# Patient Record
Sex: Male | Born: 1955 | Race: White | Hispanic: No | Marital: Married | Smoking: Former smoker
Health system: Southern US, Community
[De-identification: ages and names within clinical notes are randomized; demographics above are authoritative.]

## PROBLEM LIST (undated history)

## (undated) DIAGNOSIS — R1310 Dysphagia, unspecified: Secondary | ICD-10-CM

## (undated) DIAGNOSIS — I219 Acute myocardial infarction, unspecified: Secondary | ICD-10-CM

## (undated) DIAGNOSIS — G931 Anoxic brain damage, not elsewhere classified: Secondary | ICD-10-CM

## (undated) DIAGNOSIS — I10 Essential (primary) hypertension: Secondary | ICD-10-CM

## (undated) DIAGNOSIS — Z720 Tobacco use: Secondary | ICD-10-CM

## (undated) DIAGNOSIS — I255 Ischemic cardiomyopathy: Secondary | ICD-10-CM

## (undated) DIAGNOSIS — R0602 Shortness of breath: Secondary | ICD-10-CM

## (undated) DIAGNOSIS — E785 Hyperlipidemia, unspecified: Secondary | ICD-10-CM

## (undated) DIAGNOSIS — R569 Unspecified convulsions: Secondary | ICD-10-CM

## (undated) DIAGNOSIS — G40909 Epilepsy, unspecified, not intractable, without status epilepticus: Secondary | ICD-10-CM

## (undated) DIAGNOSIS — I2510 Atherosclerotic heart disease of native coronary artery without angina pectoris: Secondary | ICD-10-CM

## (undated) HISTORY — PX: BACK SURGERY: SHX140

## (undated) HISTORY — PX: CARDIAC CATHETERIZATION: SHX172

## (undated) HISTORY — PX: CORONARY ANGIOPLASTY WITH STENT PLACEMENT: SHX49

---

## 2012-10-02 DIAGNOSIS — I219 Acute myocardial infarction, unspecified: Secondary | ICD-10-CM

## 2012-10-02 HISTORY — DX: Acute myocardial infarction, unspecified: I21.9

## 2012-10-02 HISTORY — PX: TRACHEOSTOMY: SUR1362

## 2012-10-30 ENCOUNTER — Inpatient Hospital Stay (HOSPITAL_COMMUNITY)
Admission: EM | Admit: 2012-10-30 | Discharge: 2012-11-25 | DRG: 878 | Disposition: A | Discharge status: Rehabilitation Facility | Payer: BC Managed Care – PPO | Source: Other Acute Inpatient Hospital | Attending: Pulmonary Disease | Admitting: Pulmonary Disease

## 2012-10-30 ENCOUNTER — Inpatient Hospital Stay (HOSPITAL_COMMUNITY): Payer: BC Managed Care – PPO

## 2012-10-30 ENCOUNTER — Inpatient Hospital Stay: Payer: Self-pay | Admitting: Cardiology

## 2012-10-30 DIAGNOSIS — R7309 Other abnormal glucose: Secondary | ICD-10-CM | POA: Diagnosis present

## 2012-10-30 DIAGNOSIS — I469 Cardiac arrest, cause unspecified: Secondary | ICD-10-CM

## 2012-10-30 DIAGNOSIS — G40901 Epilepsy, unspecified, not intractable, with status epilepticus: Secondary | ICD-10-CM

## 2012-10-30 DIAGNOSIS — E872 Acidosis, unspecified: Secondary | ICD-10-CM | POA: Diagnosis present

## 2012-10-30 DIAGNOSIS — I5021 Acute systolic (congestive) heart failure: Secondary | ICD-10-CM

## 2012-10-30 DIAGNOSIS — D72829 Elevated white blood cell count, unspecified: Secondary | ICD-10-CM | POA: Diagnosis present

## 2012-10-30 DIAGNOSIS — I10 Essential (primary) hypertension: Secondary | ICD-10-CM | POA: Diagnosis present

## 2012-10-30 DIAGNOSIS — Z9861 Coronary angioplasty status: Secondary | ICD-10-CM

## 2012-10-30 DIAGNOSIS — J9601 Acute respiratory failure with hypoxia: Secondary | ICD-10-CM

## 2012-10-30 DIAGNOSIS — I2589 Other forms of chronic ischemic heart disease: Secondary | ICD-10-CM | POA: Diagnosis present

## 2012-10-30 DIAGNOSIS — I4729 Other ventricular tachycardia: Secondary | ICD-10-CM | POA: Diagnosis present

## 2012-10-30 DIAGNOSIS — R4020 Unspecified coma: Secondary | ICD-10-CM | POA: Diagnosis present

## 2012-10-30 DIAGNOSIS — R5381 Other malaise: Secondary | ICD-10-CM | POA: Diagnosis present

## 2012-10-30 DIAGNOSIS — I472 Ventricular tachycardia, unspecified: Secondary | ICD-10-CM

## 2012-10-30 DIAGNOSIS — G40401 Other generalized epilepsy and epileptic syndromes, not intractable, with status epilepticus: Secondary | ICD-10-CM | POA: Diagnosis present

## 2012-10-30 DIAGNOSIS — G931 Anoxic brain damage, not elsewhere classified: Secondary | ICD-10-CM | POA: Diagnosis present

## 2012-10-30 DIAGNOSIS — R570 Cardiogenic shock: Secondary | ICD-10-CM

## 2012-10-30 DIAGNOSIS — Z930 Tracheostomy status: Secondary | ICD-10-CM

## 2012-10-30 DIAGNOSIS — J4489 Other specified chronic obstructive pulmonary disease: Secondary | ICD-10-CM | POA: Diagnosis present

## 2012-10-30 DIAGNOSIS — I5022 Chronic systolic (congestive) heart failure: Secondary | ICD-10-CM

## 2012-10-30 DIAGNOSIS — J9600 Acute respiratory failure, unspecified whether with hypoxia or hypercapnia: Secondary | ICD-10-CM | POA: Diagnosis present

## 2012-10-30 DIAGNOSIS — IMO0002 Reserved for concepts with insufficient information to code with codable children: Secondary | ICD-10-CM | POA: Diagnosis present

## 2012-10-30 DIAGNOSIS — J9819 Other pulmonary collapse: Secondary | ICD-10-CM | POA: Diagnosis present

## 2012-10-30 DIAGNOSIS — I749 Embolism and thrombosis of unspecified artery: Secondary | ICD-10-CM | POA: Diagnosis present

## 2012-10-30 DIAGNOSIS — R4701 Aphasia: Secondary | ICD-10-CM | POA: Diagnosis present

## 2012-10-30 DIAGNOSIS — R627 Adult failure to thrive: Secondary | ICD-10-CM | POA: Diagnosis present

## 2012-10-30 DIAGNOSIS — I2510 Atherosclerotic heart disease of native coronary artery without angina pectoris: Secondary | ICD-10-CM | POA: Diagnosis present

## 2012-10-30 DIAGNOSIS — F17200 Nicotine dependence, unspecified, uncomplicated: Secondary | ICD-10-CM | POA: Diagnosis present

## 2012-10-30 DIAGNOSIS — I213 ST elevation (STEMI) myocardial infarction of unspecified site: Secondary | ICD-10-CM

## 2012-10-30 DIAGNOSIS — I498 Other specified cardiac arrhythmias: Secondary | ICD-10-CM | POA: Diagnosis present

## 2012-10-30 DIAGNOSIS — J449 Chronic obstructive pulmonary disease, unspecified: Secondary | ICD-10-CM | POA: Diagnosis present

## 2012-10-30 DIAGNOSIS — G7281 Critical illness myopathy: Secondary | ICD-10-CM | POA: Diagnosis present

## 2012-10-30 DIAGNOSIS — Z66 Do not resuscitate: Secondary | ICD-10-CM | POA: Diagnosis present

## 2012-10-30 DIAGNOSIS — I219 Acute myocardial infarction, unspecified: Secondary | ICD-10-CM

## 2012-10-30 DIAGNOSIS — E876 Hypokalemia: Secondary | ICD-10-CM | POA: Diagnosis present

## 2012-10-30 DIAGNOSIS — Z9911 Dependence on respirator [ventilator] status: Secondary | ICD-10-CM

## 2012-10-30 DIAGNOSIS — E871 Hypo-osmolality and hyponatremia: Secondary | ICD-10-CM | POA: Diagnosis present

## 2012-10-30 DIAGNOSIS — G9340 Encephalopathy, unspecified: Secondary | ICD-10-CM

## 2012-10-30 DIAGNOSIS — N390 Urinary tract infection, site not specified: Secondary | ICD-10-CM

## 2012-10-30 DIAGNOSIS — I2109 ST elevation (STEMI) myocardial infarction involving other coronary artery of anterior wall: Principal | ICD-10-CM | POA: Diagnosis present

## 2012-10-30 DIAGNOSIS — R82998 Other abnormal findings in urine: Secondary | ICD-10-CM | POA: Diagnosis present

## 2012-10-30 DIAGNOSIS — A498 Other bacterial infections of unspecified site: Secondary | ICD-10-CM | POA: Diagnosis present

## 2012-10-30 LAB — CBC
HCT: 47.4 % (ref 40.0–52.0)
HGB: 15.5 g/dL (ref 13.0–18.0)
MCH: 32 pg (ref 26.0–34.0)
MCHC: 32.7 g/dL (ref 32.0–36.0)
MCV: 98 fL (ref 80–100)
Platelet: 256 10*3/uL (ref 150–440)
RBC: 4.85 10*6/uL (ref 4.40–5.90)
RDW: 13.9 % (ref 11.5–14.5)
WBC: 15.2 10*3/uL — ABNORMAL HIGH (ref 3.8–10.6)

## 2012-10-30 LAB — COMPREHENSIVE METABOLIC PANEL
Albumin: 3.3 g/dL — ABNORMAL LOW (ref 3.4–5.0)
Co2: 10 mmol/L — CL (ref 21–32)
EGFR (Non-African Amer.): 55 — ABNORMAL LOW
Glucose: 267 mg/dL — ABNORMAL HIGH (ref 65–99)
Osmolality: 282 (ref 275–301)
Potassium: 3.9 mmol/L (ref 3.5–5.1)
SGPT (ALT): 280 U/L — ABNORMAL HIGH (ref 12–78)

## 2012-10-30 LAB — CK TOTAL AND CKMB (NOT AT ARMC): CK-MB: 3.5 ng/mL (ref 0.5–3.6)

## 2012-10-30 LAB — APTT: Activated PTT: 40.8 secs — ABNORMAL HIGH (ref 23.6–35.9)

## 2012-10-30 LAB — TROPONIN I: Troponin-I: 0.31 ng/mL — ABNORMAL HIGH

## 2012-10-30 MED ORDER — EPTIFIBATIDE 75 MG/100ML IV SOLN
INTRAVENOUS | Status: AC
  Administered 2012-10-31: 75000 ug via INTRAVENOUS
  Filled 2012-10-30: qty 100

## 2012-10-30 MED ORDER — BIOTENE DRY MOUTH MT LIQD
15.0000 mL | Freq: Four times a day (QID) | OROMUCOSAL | Status: DC
  Administered 2012-10-31 – 2012-11-21 (×88): 15 mL via OROMUCOSAL

## 2012-10-30 MED ORDER — SODIUM CHLORIDE 0.9 % IV SOLN
1.0000 ug/kg/min | INTRAVENOUS | Status: DC
  Administered 2012-10-31: 1 ug/kg/min via INTRAVENOUS
  Administered 2012-11-01: 1.2 ug/kg/min via INTRAVENOUS
  Filled 2012-10-30 (×2): qty 20

## 2012-10-30 MED ORDER — CISATRACURIUM BOLUS VIA INFUSION
0.1000 mg/kg | Freq: Once | INTRAVENOUS | Status: AC
  Administered 2012-10-31: 7.6 mg via INTRAVENOUS
  Filled 2012-10-30: qty 8

## 2012-10-30 MED ORDER — MIDAZOLAM BOLUS VIA INFUSION
2.0000 mg | INTRAVENOUS | Status: DC | PRN
  Filled 2012-10-30: qty 2

## 2012-10-30 MED ORDER — MIDAZOLAM HCL 5 MG/ML IJ SOLN
1.0000 mg/h | INTRAMUSCULAR | Status: DC
  Administered 2012-10-31 (×3): 2 mg/h via INTRAVENOUS
  Administered 2012-11-01: 1 mg/h via INTRAVENOUS
  Administered 2012-11-01: 4 mg/h via INTRAVENOUS
  Administered 2012-11-03: 2 mg/h via INTRAVENOUS
  Filled 2012-10-30 (×5): qty 10

## 2012-10-30 MED ORDER — MIDAZOLAM HCL 5 MG/ML IJ SOLN: 2.0000 mg | Freq: Once | INTRAMUSCULAR | Status: DC | PRN

## 2012-10-30 MED ORDER — CHLORHEXIDINE GLUCONATE 0.12 % MT SOLN
15.0000 mL | Freq: Two times a day (BID) | OROMUCOSAL | Status: DC
  Administered 2012-10-31 – 2012-11-21 (×43): 15 mL via OROMUCOSAL
  Filled 2012-10-30 (×47): qty 15

## 2012-10-30 MED ORDER — ARTIFICIAL TEARS OP OINT
1.0000 "application " | TOPICAL_OINTMENT | Freq: Three times a day (TID) | OPHTHALMIC | Status: DC
  Administered 2012-10-31 – 2012-11-01 (×5): 1 via OPHTHALMIC
  Filled 2012-10-30: qty 3.5

## 2012-10-30 MED ORDER — SODIUM CHLORIDE 0.9 % IV SOLN: 2000.0000 mL | Freq: Once | INTRAVENOUS | Status: AC

## 2012-10-30 MED ORDER — LABETALOL HCL 5 MG/ML IV SOLN
INTRAVENOUS | Status: AC
  Filled 2012-10-30: qty 4

## 2012-10-30 MED ORDER — ASPIRIN 300 MG RE SUPP
300.0000 mg | RECTAL | Status: AC
  Filled 2012-10-30: qty 1

## 2012-10-30 MED ORDER — SODIUM CHLORIDE 0.9 % IV SOLN
25.0000 ug/h | INTRAVENOUS | Status: DC
  Administered 2012-10-31 (×2): 100 ug/h via INTRAVENOUS
  Administered 2012-10-31: 150 ug/h via INTRAVENOUS
  Administered 2012-11-01: 100 ug/h via INTRAVENOUS
  Administered 2012-11-01: 250 ug/h via INTRAVENOUS
  Filled 2012-10-30 (×4): qty 50

## 2012-10-30 MED ORDER — NOREPINEPHRINE BITARTRATE 1 MG/ML IJ SOLN
2.0000 ug/min | INTRAVENOUS | Status: DC
  Administered 2012-10-31: 10 ug/min via INTRAVENOUS
  Administered 2012-10-31: 5 ug/min via INTRAVENOUS
  Administered 2012-11-01: 16 ug/min via INTRAVENOUS
  Administered 2012-11-01: 25 ug/min via INTRAVENOUS
  Filled 2012-10-30 (×6): qty 8

## 2012-10-30 MED ORDER — CISATRACURIUM BOLUS VIA INFUSION
0.0500 mg/kg | Freq: Once | INTRAVENOUS | Status: AC | PRN
  Filled 2012-10-30: qty 4

## 2012-10-30 MED ORDER — FENTANYL BOLUS VIA INFUSION
50.0000 ug | INTRAVENOUS | Status: DC | PRN
  Filled 2012-10-30: qty 50

## 2012-10-30 MED ORDER — FENTANYL CITRATE 0.05 MG/ML IJ SOLN
INTRAMUSCULAR | Status: AC
  Administered 2012-10-30: 100 ug
  Filled 2012-10-30: qty 2

## 2012-10-30 NOTE — Consult Note (Signed)
Reason for Consult: Cardiac Arrest s/p revascularization, IABP, Cooling protocol Referring Physician: Dr. Guadlupe Spanish Walter Frazier is an 57 y.o. male.  HPI: Mr. Walter Frazier is a 57 yo man with prior history of 1 ppd tobacco who had out of hospital cardiac arrest and was taken to [facility]. Walter Frazier was taken the cardiac catheterization lab given ST elevation on ECG in setting of cardiac arrest where by report he had proximal LAD occlusion with atherectomy and DES to proximal LAD. Per discussion with Walter Frazier wife by Dr. Kendrick Fries, he was working on a car when he went down and a nurse friend was at their home along with a firefighter neighborhood who initiated CPR. EMS called/arrived and CPR in progress with 3 defibrillations reported in the field, probably several more in the ambulance and ER followed by 6-8 defibrillations in the cath lab during revascularization leading to IABP placement. I personally called the ICU along with my nursing staff here in our CICU and we confirmed that the following were given:   600 mg plavix at 18:11 through NGT, 324 mg aspirin at 18:11 through NGT, angiomax 27 ml/hr as 250 mg/50 ml NS with 61 mg bivalirudin recorded at 17:02, 1 gram Mg 17:24, amiodarone 150 mg at 17:30 with subsequent drip started, integrellin started at 17:46 at 12.3 ml/hr. On arrival, Walter Frazier cool extremities on cooling protocol, IABP with Chest x-ray confirmation of good position and good augmentation.    Family history of CAD  Social History: + tobacco, 1 ppd, no significant etoh or drugs  Allergies: No Known Allergies  Medications:  I have reviewed the patient's current medications. Prior to Admission:  No prescriptions prior to admission   Scheduled: . antiseptic oral rinse  15 mL Mouth Rinse QID  . artificial tears  1 application Both Eyes Q8H  . chlorhexidine  15 mL Mouth Rinse BID  . labetalol      . pantoprazole (PROTONIX) IV  40 mg  Intravenous QHS   Continuous: . cisatracurium (NIMBEX) infusion 1 mcg/kg/min (10/31/12 0021)  . fentaNYL infusion INTRAVENOUS 100 mcg/hr (10/31/12 0023)  . heparin    . midazolam (VERSED) infusion 2 mg/hr (10/31/12 0009)  . norepinephrine (LEVOPHED) Adult infusion      Results for orders placed during the hospital encounter of 10/30/12 (from the past 48 hour(s))  GLUCOSE, CAPILLARY     Status: Abnormal   Collection Time    10/30/12 11:54 PM      Result Value Range   Glucose-Capillary 256 (*) 70 - 99 mg/dL   Comment 1 Notify RN    POCT I-STAT, CHEM 8     Status: Abnormal   Collection Time    10/31/12 12:41 AM      Result Value Range   Sodium 138  135 - 145 mEq/L   Potassium 3.8  3.5 - 5.1 mEq/L   Chloride 106  96 - 112 mEq/L   BUN 17  6 - 23 mg/dL   Creatinine, Ser 1.61  0.50 - 1.35 mg/dL   Glucose, Bld 096 (*) 70 - 99 mg/dL   Calcium, Ion 0.45  4.09 - 1.23 mmol/L   TCO2 20  0 - 100 mmol/L   Hemoglobin 18.4 (*) 13.0 - 17.0 g/dL   HCT 81.1 (*) 91.4 - 78.2 %  POCT I-STAT 3, BLOOD GAS (G3+)     Status: Abnormal   Collection Time    10/31/12 12:47 AM      Result Value  Range   pH, Arterial 7.174 (*) 7.350 - 7.450   pCO2 arterial 44.0  35.0 - 45.0 mmHg   pO2, Arterial 179.0 (*) 80.0 - 100.0 mmHg   Bicarbonate 16.2 (*) 20.0 - 24.0 mEq/L   TCO2 17  0 - 100 mmol/L   O2 Saturation 99.0     Acid-base deficit 12.0 (*) 0.0 - 2.0 mmol/L   Collection site RADIAL, ALLEN'S TEST ACCEPTABLE     Drawn by RT     Sample type ARTERIAL     Comment NOTIFIED PHYSICIAN    GLUCOSE, CAPILLARY     Status: Abnormal   Collection Time    10/31/12 12:57 AM      Result Value Range   Glucose-Capillary 243 (*) 70 - 99 mg/dL    Dg Chest Portable 1 View  10/31/2012   *RADIOLOGY REPORT*  Clinical Data: Central line placement.  PORTABLE CHEST - 1 VIEW  Comparison: Chest radiograph performed 10/30/2012  Findings: The patient's endotracheal tube is seen ending 6-7 cm above the carina.  A left IJ line is  noted ending about the distal SVC.  An enteric tube is noted extending below the diaphragm.  The patient's intra-aortic balloon pump tip has migrated approximately 2 cm superiorly since the prior study.  The lungs are well expanded.  Minimal left basilar density is thought to reflect normal vasculature and the overlying external pacing pad.  No pleural effusion or pneumothorax is seen.  The cardiomediastinal silhouette remains normal in size.  No acute osseous abnormalities are seen.  IMPRESSION:  1.  Left IJ line noted ending about the distal SVC. 2.  Endotracheal tube seen ending 6-7 cm above the carina.  Would advance this by 3-4 cm. 3.  Intra-aortic balloon pump tip has migrated 2 cm superiorly since the prior study. Would retract this by 2 cm. 4.  No acute cardiopulmonary process seen.  These results were called by telephone on 10/31/2012 at 12:31 a.m. to Nursing on [id number], who verbally acknowledged these results.   Original Report Authenticated By: Walter Frazier, M.D.   Dg Chest Port 1 View  10/30/2012   *RADIOLOGY REPORT*  Clinical Data: Assess intra-aortic balloon pump.  PORTABLE CHEST - 1 VIEW  Comparison: None.  Findings: The patient's endotracheal tube is seen ending 5-6 cm above the carina.  An enteric tube is noted extending below the diaphragm.  The tip of the intra-aortic balloon pump is seen just below the aortic arch, just above the level of the carina, in appropriate position.  The lungs are relatively well expanded.  Mild vascular congestion is noted, without definite pulmonary edema.  The external pacing pad is noted overlying the left hemithorax.  No pleural effusion or pneumothorax is seen.  The cardiomediastinal silhouette is normal in size.  No acute osseous abnormalities are identified.  IMPRESSION:  1.  Tip of the intra-aortic balloon pump noted just above the level of the carina and just below the aortic arch, in appropriate position. 2.  Endotracheal tube seen ending 5-6 cm above the  carina. 3.  Mild vascular congestion, without definite pulmonary edema.   Original Report Authenticated By: Walter Frazier, M.D.    Review of Systems  Unable to perform ROS: intubated   Blood pressure 100/56, pulse 63, temperature 92.5 F (33.6 C), temperature source Core (Comment), resp. rate 20, height 5\' 9"  (1.753 m), weight 76.3 kg (168 lb 3.4 oz), SpO2 98.00%. Physical Exam  Nursing note and vitals reviewed. Constitutional: He is oriented to person,  place, and time.  Intubated/sedated  HENT:  Intubated, sedated, ETT in place, left IJ in place  Eyes: No scleral icterus.  Pupils not responsive but recent sedatives and vecuronium  Neck: Normal range of motion. Neck supple. No JVD present. No tracheal deviation present. No thyromegaly present.  Cardiovascular: Normal rate, regular rhythm and intact distal pulses.   No murmur heard. Respiratory:  Loud breath sounds with mechanical ventilation, no overt rales or wheezing  GI: Soft.  Cool. Hypoactive BS  Musculoskeletal: He exhibits no edema.  Cool extremities. Right femoral IABP site c/d/i  Neurological: He is alert and oriented to person, place, and time.  Intubated, sedated, cooling protocol/wraps on  Skin:  lukewarm   Labs pending Chest x-ray: ETT slightly high; IABP 1 cm below aortic arch, slightly higher on repeat; I moved it down 1 cm and ETT also advanced with repeat chest x-ray pending OSH ECG with V1-V5 STEMI, predominately V3-V4 with some lateral ST depression; q waves already appreciated  Problem List Cardiac Arrest - presumed, VT/VF  STEMI - s/p proximal LAD DES Coronary Artery Disease Reduced Ejection fraction reported from OSH Cooling protocol Acute respiratory failure Tobacco abuse  Assessment/Plan: 57 yo man with history of tobacco abuse and family history of CAD who had out of hospital cardiac arrest with 15 minutes of bystander (presumed RN and firefighter) CPR, defibrillations and went to cardiac  catheterization lab late afternoon/early evening 5/29 with 6-8 defibrillations for VT during revascularization with amiodarone bolus and IABP placed with improved arrhythmia and hemodynamics transferred to [facility] for continued critical care. In the current state we will continue cooling protocol (24 hours cooling followed by 13 hour rewarming) along with IABP for hemodynamic and arrythmia stabilization along with mechanical respiratory support. He is currently hemodynamically stable without vasopressor support. Heparin protocol for IABP initiated, integrellin gtt running for 18 hours after starting at 17:46, plavix 75 mg to be continued in AM after loading dose at 18:xx.  - continue full supportive care - received aspirin 324 mg, plavix 600 mg, bivalirudin during cardiac cath with proximal LAD DES placed - no current bleeding issues on exam; if changes will readdress integrellin gtt - plavix 75 mg daily; asa 81 mg daily - heparin gtt for IABP - goal MAP > 65 - on amiodarone 1 mg/min after 150 mg load; will transition to 0.5 mg/min to complete 24 hour 1050 mg load and likely transition to oral amiodarone afterwards - appreciate Pulmonary critical care team - reevaluate labs/renal function, electrolytes per cooling protocol (frequently) - PPI - Echo in AM   Walter Frazier 10/31/2012, 1:02 AM

## 2012-10-30 NOTE — Progress Notes (Signed)
ANTICOAGULATION CONSULT NOTE - Initial Consult  Pharmacy Consult for heparin  Indication: S/p cardiac arrest / therapeutic hypothermia / IABP   No Known Allergies  Patient Measurements: Height: 5\' 9"  (175.3 cm) Weight: 168 lb 3.4 oz (76.3 kg) IBW/kg (Calculated) : 70.7 Heparin Dosing Weight: 76 kg   Vital Signs: BP: 179/139 mmHg (05/29 2320) Pulse Rate: 83 (05/29 2310)  Labs: No results found for this basename: HGB, HCT, PLT, APTT, LABPROT, INR, HEPARINUNFRC, CREATININE, CKTOTAL, CKMB, TROPONINI,  in the last 72 hours  CrCl is unknown because no creatinine reading has been taken.  Medical History: No past medical history on file.  Medications:  No prescriptions prior to admission   Assessment: 57 yo male transferred from [place] following cardiac arrest. At [facility], patient underwent PCI with stenting and IABP placement. During procedure patient with multiple episodes of ventricular tachycardia and ventricular fibrillation requiring cardioversion and amiodarone infusion started. Labs from [place] included Hgb 15.5, plt 256, and SrCr 1.43. Patient transferred with amiodarone and Integrilin infusing. Therapeutic hypothermia to be initiated and IABP remains. Pharmacy to manage IV heparin.   Goal of Therapy:  Heparin level 0.2 - 0.5 units/ml (therapeutic hypothermia / IABP) Monitor platelets by anticoagulation protocol: Yes   Plan:  1. Heparin IV infusion at 600 units/hr.  2. Heparin level in 6 hours.  3. Daily CBC, heparin level   Emeline Gins 10/30/2012,11:36 PM

## 2012-10-31 ENCOUNTER — Encounter (HOSPITAL_COMMUNITY): Payer: Self-pay | Admitting: Pulmonary Disease

## 2012-10-31 ENCOUNTER — Inpatient Hospital Stay (HOSPITAL_COMMUNITY): Payer: BC Managed Care – PPO

## 2012-10-31 DIAGNOSIS — G9340 Encephalopathy, unspecified: Secondary | ICD-10-CM

## 2012-10-31 DIAGNOSIS — J9601 Acute respiratory failure with hypoxia: Secondary | ICD-10-CM

## 2012-10-31 DIAGNOSIS — I472 Ventricular tachycardia, unspecified: Secondary | ICD-10-CM

## 2012-10-31 DIAGNOSIS — I469 Cardiac arrest, cause unspecified: Secondary | ICD-10-CM

## 2012-10-31 DIAGNOSIS — I369 Nonrheumatic tricuspid valve disorder, unspecified: Secondary | ICD-10-CM

## 2012-10-31 DIAGNOSIS — I219 Acute myocardial infarction, unspecified: Secondary | ICD-10-CM

## 2012-10-31 DIAGNOSIS — I213 ST elevation (STEMI) myocardial infarction of unspecified site: Secondary | ICD-10-CM

## 2012-10-31 DIAGNOSIS — J9600 Acute respiratory failure, unspecified whether with hypoxia or hypercapnia: Secondary | ICD-10-CM

## 2012-10-31 HISTORY — DX: Ventricular tachycardia, unspecified: I47.20

## 2012-10-31 HISTORY — DX: Acute respiratory failure with hypoxia: J96.01

## 2012-10-31 HISTORY — DX: Encephalopathy, unspecified: G93.40

## 2012-10-31 LAB — PROTIME-INR
INR: 1.31 (ref 0.00–1.49)
INR: 1.13 (ref 0.00–1.49)

## 2012-10-31 LAB — GLUCOSE, CAPILLARY
Glucose-Capillary: 113 mg/dL — ABNORMAL HIGH (ref 70–99)
Glucose-Capillary: 115 mg/dL — ABNORMAL HIGH (ref 70–99)
Glucose-Capillary: 118 mg/dL — ABNORMAL HIGH (ref 70–99)
Glucose-Capillary: 124 mg/dL — ABNORMAL HIGH (ref 70–99)
Glucose-Capillary: 134 mg/dL — ABNORMAL HIGH (ref 70–99)
Glucose-Capillary: 135 mg/dL — ABNORMAL HIGH (ref 70–99)
Glucose-Capillary: 144 mg/dL — ABNORMAL HIGH (ref 70–99)
Glucose-Capillary: 148 mg/dL — ABNORMAL HIGH (ref 70–99)
Glucose-Capillary: 169 mg/dL — ABNORMAL HIGH (ref 70–99)
Glucose-Capillary: 199 mg/dL — ABNORMAL HIGH (ref 70–99)
Glucose-Capillary: 220 mg/dL — ABNORMAL HIGH (ref 70–99)
Glucose-Capillary: 224 mg/dL — ABNORMAL HIGH (ref 70–99)
Glucose-Capillary: 225 mg/dL — ABNORMAL HIGH (ref 70–99)
Glucose-Capillary: 243 mg/dL — ABNORMAL HIGH (ref 70–99)

## 2012-10-31 LAB — POCT I-STAT 3, ART BLOOD GAS (G3+)
Bicarbonate: 19.1 mEq/L — ABNORMAL LOW (ref 20.0–24.0)
Bicarbonate: 20.9 mEq/L (ref 20.0–24.0)
O2 Saturation: 99 %
TCO2: 20 mmol/L (ref 0–100)
pCO2 arterial: 30.7 mmHg — ABNORMAL LOW (ref 35.0–45.0)
pH, Arterial: 7.174 — CL (ref 7.350–7.450)
pH, Arterial: 7.388 (ref 7.350–7.450)
pO2, Arterial: 104 mmHg — ABNORMAL HIGH (ref 80.0–100.0)

## 2012-10-31 LAB — POCT I-STAT, CHEM 8
Calcium, Ion: 1.15 mmol/L (ref 1.12–1.23)
Chloride: 105 mEq/L (ref 96–112)
Glucose, Bld: 247 mg/dL — ABNORMAL HIGH (ref 70–99)
Glucose, Bld: 196 mg/dL — ABNORMAL HIGH (ref 70–99)
HCT: 51 % (ref 39.0–52.0)
HCT: 51 % (ref 39.0–52.0)
HCT: 54 % — ABNORMAL HIGH (ref 39.0–52.0)
Hemoglobin: 17.3 g/dL — ABNORMAL HIGH (ref 13.0–17.0)
Hemoglobin: 17.3 g/dL — ABNORMAL HIGH (ref 13.0–17.0)
Hemoglobin: 17 g/dL (ref 13.0–17.0)
Potassium: 4 mEq/L (ref 3.5–5.1)
Potassium: 3.5 mEq/L (ref 3.5–5.1)
Sodium: 138 mEq/L (ref 135–145)
Sodium: 138 mEq/L (ref 135–145)
TCO2: 20 mmol/L (ref 0–100)
TCO2: 21 mmol/L (ref 0–100)

## 2012-10-31 LAB — BASIC METABOLIC PANEL
BUN: 12 mg/dL (ref 6–23)
CO2: 24 mEq/L (ref 19–32)
CO2: 23 mEq/L (ref 19–32)
Calcium: 7.6 mg/dL — ABNORMAL LOW (ref 8.4–10.5)
Calcium: 7.8 mg/dL — ABNORMAL LOW (ref 8.4–10.5)
Chloride: 101 mEq/L (ref 96–112)
Chloride: 101 mEq/L (ref 96–112)
Chloride: 100 mEq/L (ref 96–112)
Creatinine, Ser: 0.64 mg/dL (ref 0.50–1.35)
GFR calc Af Amer: >90 mL/min (ref >90)
GFR calc Af Amer: >90 mL/min (ref >90)
GFR calc Af Amer: >90 mL/min (ref >90)
GFR calc non Af Amer: >90 mL/min (ref >90)
Glucose, Bld: 153 mg/dL — ABNORMAL HIGH (ref 70–99)
Potassium: 3.4 mEq/L — ABNORMAL LOW (ref 3.5–5.1)
Potassium: 3.1 mEq/L — ABNORMAL LOW (ref 3.5–5.1)
Potassium: 3.8 mEq/L (ref 3.5–5.1)
Sodium: 135 mEq/L (ref 135–145)
Sodium: 136 mEq/L (ref 135–145)
Sodium: 134 mEq/L — ABNORMAL LOW (ref 135–145)

## 2012-10-31 LAB — CBC
MCH: 32.3 pg (ref 26.0–34.0)
MCHC: 36.4 g/dL — ABNORMAL HIGH (ref 30.0–36.0)
Platelets: 254 10*3/uL (ref 150–400)

## 2012-10-31 LAB — APTT
aPTT: 43 seconds — ABNORMAL HIGH (ref 24–37)
aPTT: 49 seconds — ABNORMAL HIGH (ref 24–37)

## 2012-10-31 LAB — LACTIC ACID, PLASMA: Lactic Acid, Venous: 4.2 mmol/L — ABNORMAL HIGH (ref 0.5–2.2)

## 2012-10-31 LAB — HEMOGLOBIN A1C: Hgb A1c MFr Bld: 5.8 % — ABNORMAL HIGH (ref <5.7)

## 2012-10-31 MED ORDER — HEPARIN (PORCINE) IN NACL 100-0.45 UNIT/ML-% IJ SOLN
1200.0000 [IU]/h | INTRAMUSCULAR | Status: DC
  Administered 2012-11-01: 850 [IU]/h via INTRAVENOUS
  Administered 2012-11-01: 950 [IU]/h via INTRAVENOUS
  Administered 2012-11-02: 1000 [IU]/h via INTRAVENOUS
  Filled 2012-10-31 (×5): qty 250

## 2012-10-31 MED ORDER — AMIODARONE HCL 200 MG PO TABS
200.0000 mg | ORAL_TABLET | Freq: Two times a day (BID) | ORAL | Status: DC
  Administered 2012-11-01 – 2012-11-03 (×4): 200 mg via ORAL
  Filled 2012-10-31 (×5): qty 1

## 2012-10-31 MED ORDER — SODIUM CHLORIDE 0.9 % IV SOLN
INTRAVENOUS | Status: DC
  Administered 2012-10-31: 10 mL/h via INTRAVENOUS
  Administered 2012-11-02 – 2012-11-10 (×5): via INTRAVENOUS

## 2012-10-31 MED ORDER — ASPIRIN EC 81 MG PO TBEC
81.0000 mg | DELAYED_RELEASE_TABLET | Freq: Every day | ORAL | Status: DC
  Filled 2012-10-31: qty 1

## 2012-10-31 MED ORDER — EPTIFIBATIDE 75 MG/100ML IV SOLN
2.0000 ug/kg/min | INTRAVENOUS | Status: AC
  Administered 2012-10-31: 2 ug/kg/min via INTRAVENOUS
  Filled 2012-10-31: qty 100

## 2012-10-31 MED ORDER — SODIUM CHLORIDE 0.9 % IV SOLN
INTRAVENOUS | Status: DC
  Administered 2012-10-31: 2.2 [IU]/h via INTRAVENOUS
  Administered 2012-10-31: 1.8 [IU]/h via INTRAVENOUS
  Administered 2012-10-31: 4.2 [IU]/h via INTRAVENOUS
  Filled 2012-10-31 (×2): qty 1

## 2012-10-31 MED ORDER — SODIUM BICARBONATE 8.4 % IV SOLN
INTRAVENOUS | Status: DC
  Administered 2012-10-31: 01:00:00 via INTRAVENOUS
  Filled 2012-10-31 (×4): qty 150

## 2012-10-31 MED ORDER — AMIODARONE HCL IN DEXTROSE 360-4.14 MG/200ML-% IV SOLN
30.0000 mg/h | INTRAVENOUS | Status: DC
  Administered 2012-10-31 – 2012-11-02 (×4): 30 mg/h via INTRAVENOUS
  Filled 2012-10-31 (×10): qty 200

## 2012-10-31 MED ORDER — POTASSIUM CHLORIDE 10 MEQ/50ML IV SOLN
10.0000 meq | INTRAVENOUS | Status: AC
  Administered 2012-10-31 (×4): 10 meq via INTRAVENOUS
  Filled 2012-10-31: qty 200

## 2012-10-31 MED ORDER — PANTOPRAZOLE SODIUM 40 MG IV SOLR
40.0000 mg | Freq: Every day | INTRAVENOUS | Status: DC
  Administered 2012-10-31: 40 mg via INTRAVENOUS
  Filled 2012-10-31 (×2): qty 40

## 2012-10-31 MED ORDER — POTASSIUM CHLORIDE 10 MEQ/50ML IV SOLN
10.0000 meq | INTRAVENOUS | Status: AC
  Administered 2012-10-31 (×2): 10 meq via INTRAVENOUS
  Filled 2012-10-31: qty 100

## 2012-10-31 MED ORDER — INSULIN ASPART 100 UNIT/ML ~~LOC~~ SOLN
2.0000 [IU] | SUBCUTANEOUS | Status: DC
  Administered 2012-10-31 – 2012-11-01 (×3): 2 [IU] via SUBCUTANEOUS

## 2012-10-31 MED ORDER — SODIUM BICARBONATE 8.4 % IV SOLN
100.0000 meq | Freq: Once | INTRAVENOUS | Status: AC
  Administered 2012-10-31: 100 meq via INTRAVENOUS

## 2012-10-31 MED ORDER — PANTOPRAZOLE SODIUM 40 MG PO PACK
40.0000 mg | PACK | Freq: Every day | ORAL | Status: DC
  Administered 2012-10-31 – 2012-11-09 (×10): 40 mg
  Filled 2012-10-31 (×13): qty 20

## 2012-10-31 MED ORDER — SODIUM CHLORIDE 0.9 % IV SOLN
INTRAVENOUS | Status: DC
  Administered 2012-10-31: 10 mL/h via INTRAVENOUS
  Administered 2012-11-02 – 2012-11-07 (×3): via INTRAVENOUS

## 2012-10-31 MED ORDER — ASPIRIN 81 MG PO CHEW
81.0000 mg | CHEWABLE_TABLET | Freq: Every day | ORAL | Status: DC
  Administered 2012-10-31 – 2012-11-03 (×4): 81 mg via ORAL
  Filled 2012-10-31 (×4): qty 1

## 2012-10-31 MED ORDER — HEPARIN (PORCINE) IN NACL 100-0.45 UNIT/ML-% IJ SOLN
800.0000 [IU]/h | INTRAMUSCULAR | Status: DC
  Administered 2012-10-31: 600 [IU]/h via INTRAVENOUS
  Filled 2012-10-31: qty 250

## 2012-10-31 MED ORDER — AMIODARONE HCL IN DEXTROSE 360-4.14 MG/200ML-% IV SOLN
30.0000 mg/h | INTRAVENOUS | Status: AC
  Administered 2012-10-31 (×2): 30 mg/h via INTRAVENOUS

## 2012-10-31 MED ORDER — CLOPIDOGREL BISULFATE 75 MG PO TABS
75.0000 mg | ORAL_TABLET | Freq: Every day | ORAL | Status: DC
  Administered 2012-10-31 – 2012-11-03 (×4): 75 mg via ORAL
  Filled 2012-10-31 (×6): qty 1

## 2012-10-31 MED ORDER — SODIUM CHLORIDE 0.9 % IV SOLN
Freq: Once | INTRAVENOUS | Status: AC
  Administered 2012-10-30: via INTRAVENOUS

## 2012-10-31 MED ORDER — INSULIN GLARGINE 100 UNIT/ML ~~LOC~~ SOLN
10.0000 [IU] | SUBCUTANEOUS | Status: DC
  Administered 2012-10-31 – 2012-11-01 (×2): 10 [IU] via SUBCUTANEOUS
  Filled 2012-10-31 (×5): qty 0.1

## 2012-10-31 MED ORDER — INSULIN ASPART 100 UNIT/ML ~~LOC~~ SOLN: 0.0000 [IU] | SUBCUTANEOUS | Status: DC

## 2012-10-31 MED ORDER — SODIUM CHLORIDE 0.9 % IV SOLN
INTRAVENOUS | Status: DC
  Administered 2012-10-31: 10 mL/h via INTRAVENOUS

## 2012-10-31 NOTE — Progress Notes (Signed)
ANTICOAGULATION CONSULT NOTE - Follow Up Consult  Pharmacy Consult for heparin  Indication: S/p cardiac arrest / therapeutic hypothermia / IABP   No Known Allergies  Patient Measurements: Height: 5\' 9"  (175.3 cm) Weight: 168 lb 3.4 oz (76.3 kg) IBW/kg (Calculated) : 70.7 Heparin Dosing Weight: 76 kg   Vital Signs: Temp: 91.2 F (32.9 C) (05/30 2000) Temp src: Core (Comment) (05/30 2000) BP: 75/55 mmHg (05/30 1900) Pulse Rate: 59 (05/30 1952)  Labs:  Recent Labs  10/30/12 2314  10/31/12 0228 10/31/12 1610 10/31/12 0622 10/31/12 0829 10/31/12 0831 10/31/12 1151 10/31/12 1211 10/31/12 1728 10/31/12 2000  HGB  --   < > 17.3* 17.3* 16.4  17.0  --   --   --   --   --   --   HCT  --   < > 51.0 51.0 45.1  50.0  --   --   --   --   --   --   PLT  --   --   --   --  254  --   --   --   --   --   --   APTT 43*  --   --   --   --  49*  --   --   --   --   --   LABPROT 16.0*  --   --   --   --  14.3  --   --   --   --   --   INR 1.31  --   --   --   --  1.13  --   --   --   --   --   HEPARINUNFRC  --   --   --   --  0.10*  --   --   --  0.20*  --  0.41  CREATININE  --   < > 0.90 0.80 0.80  --  0.81 0.75  --  0.64  --   < > = values in this interval not displayed.  Estimated Creatinine Clearance: 104.3 ml/min (by C-G formula based on Cr of 0.64).  Assessment: 57 yo male transferred from [place] following cardiac arrest. At [facility], patient underwent PCI with stenting and IABP placement. During procedure patient with multiple episodes of ventricular tachycardia and ventricular fibrillation requiring cardioversion and amiodarone infusion started.  Patient transferred with amiodarone and Integrilin infusing.  Integrilin was stopped at 1200 noon d/t blood tinged sputum. Therapeutic hypothermia initiated and IABP remains. Pharmacy to manage IV heparin.  Heparin drip 950 uts/hr with HL 0.4   Goal of Therapy:  Heparin level 0.2 - 0.5 units/ml (therapeutic hypothermia /  IABP) Monitor platelets by anticoagulation protocol: Yes   Plan:  1. Continue  IV heparin to 950 units/hr. 2. Heparin level and CBC daily  Leota Sauers Pharm.D. CPP, BCPS Clinical Pharmacist [phone] 10/31/2012 8:45 PM

## 2012-10-31 NOTE — Progress Notes (Signed)
INITIAL NUTRITION ASSESSMENT  DOCUMENTATION CODES Per approved criteria  -Not Applicable   INTERVENTION: 1. Recommend if pt to remain intubated post rewarming, initiate Osmolite 1.2 @ 20 ml/hr via OG tube and advance by 10 ml q 4 hr to a goal rate of 60 ml/hr. 30 ml Pro-stat once daily. This EN regimen at goal rate would provide 1828 kcal, 95 gm protein, and 1180 ml free water.    NUTRITION DIAGNOSIS: Inadequate oral intake related to inability to eat as evidenced by NPO diet.   Goal: Meet >/=90% estimated nutrition need.   Monitor:  Vent status, weight trends, EN initiation, labs  Reason for Assessment: VRDF  57 y.o. male  Admitting Dx: Cardiac arrest  ASSESSMENT: Pt admitted after witness cardiac arrest with CPR and shocks. S/p cardiac cath and now on hypothermia protocol.  Recommend initiation of EN once pt has been rewarmed and if remains intubated.    Height: Ht Readings from Last 1 Encounters:  10/30/12 5\' 9"  (1.753 m)    Weight: Wt Readings from Last 1 Encounters:  10/30/12 168 lb 3.4 oz (76.3 kg)    Ideal Body Weight: 160 lbs   % Ideal Body Weight: 105%  Wt Readings from Last 10 Encounters:  10/30/12 168 lb 3.4 oz (76.3 kg)    Usual Body Weight: unknown   % Usual Body Weight: ---  BMI:  Body mass index is 24.83 kg/(m^2). WNL  Patient is currently intubated on ventilator support.  MV: 11.8 Temp:Temp (24hrs), Avg:91.2 F (32.9 C), Min:89.2 F (31.8 C), Max:92.5 F (33.6 C)  Propofol: none   Estimated Nutritional Needs: Kcal: 1856 Protein: 95-105 gm  Fluid: >/= 1.8 L   Skin: intact   Diet Order:   NPO  EDUCATION NEEDS: -No education needs identified at this time   Intake/Output Summary (Last 24 hours) at 10/31/12 1332 Last data filed at 10/31/12 1223  Gross per 24 hour  Intake 2378.8 ml  Output   1077 ml  Net 1301.8 ml    Last BM: PTA   Labs:   Recent Labs Lab 10/31/12 0622 10/31/12 0831 10/31/12 1151  NA 139 135 136   K 3.2* 3.4* 3.1*  CL 106 101 102  CO2  --  24 25  BUN 16 15 14   CREATININE 0.80 0.81 0.75  CALCIUM  --  7.6* 7.8*  GLUCOSE 196* 153* 153*    CBG (last 3)   Recent Labs  10/31/12 0947 10/31/12 1056 10/31/12 1202  GLUCAP 144* 141* 118*    Scheduled Meds: . [START ON 11/01/2012] amiodarone  200 mg Oral BID  . antiseptic oral rinse  15 mL Mouth Rinse QID  . artificial tears  1 application Both Eyes Q8H  . aspirin  81 mg Oral Daily  . chlorhexidine  15 mL Mouth Rinse BID  . clopidogrel  75 mg Oral Q breakfast  . pantoprazole (PROTONIX) IV  40 mg Intravenous QHS  . potassium chloride  10 mEq Intravenous Q1 Hr x 4    Continuous Infusions: . sodium chloride 10 mL/hr (10/31/12 0030)  . sodium chloride 10 mL/hr (10/31/12 0030)  . sodium chloride 10 mL/hr (10/31/12 0030)  . amiodarone (NEXTERONE PREMIX) 360 mg/200 mL dextrose    . cisatracurium (NIMBEX) infusion 1.005 mcg/kg/min (10/31/12 0300)  . fentaNYL infusion INTRAVENOUS 100 mcg/hr (10/31/12 0023)  . heparin 800 Units/hr (10/31/12 0700)  . insulin (NOVOLIN-R) infusion 2.3 Units/hr (10/31/12 1206)  . midazolam (VERSED) infusion 2 mg/hr (10/31/12 0009)  . norepinephrine (LEVOPHED)  Adult infusion 10 mcg/min (10/31/12 1223)  .  sodium bicarbonate  infusion 1000 mL 75 mL/hr at 10/31/12 0300    No past medical history on file.  No past surgical history on file.  Clarene Duke RD, LDN Pager [phone] After Hours pager [phone]

## 2012-10-31 NOTE — Progress Notes (Addendum)
ANTICOAGULATION CONSULT NOTE - Follow Up Consult  Pharmacy Consult for heparin  Indication: S/p cardiac arrest / therapeutic hypothermia / IABP   No Known Allergies  Patient Measurements: Height: 5\' 9"  (175.3 cm) Weight: 168 lb 3.4 oz (76.3 kg) IBW/kg (Calculated) : 70.7 Heparin Dosing Weight: 76 kg   Vital Signs: Temp: 91.8 F (33.2 C) (05/30 1200) Temp src: Core (Comment) (05/30 0500) BP: 163/123 mmHg (05/30 1300) Pulse Rate: 58 (05/30 1200)  Labs:  Recent Labs  10/30/12 2314  10/31/12 0228 10/31/12 0438 10/31/12 0622 10/31/12 0829 10/31/12 0831 10/31/12 1151 10/31/12 1211  HGB  --   < > 17.3* 17.3* 16.4  17.0  --   --   --   --   HCT  --   < > 51.0 51.0 45.1  50.0  --   --   --   --   PLT  --   --   --   --  254  --   --   --   --   APTT 43*  --   --   --   --  49*  --   --   --   LABPROT 16.0*  --   --   --   --  14.3  --   --   --   INR 1.31  --   --   --   --  1.13  --   --   --   HEPARINUNFRC  --   --   --   --  0.10*  --   --   --  0.20*  CREATININE  --   < > 0.90 0.80 0.80  --  0.81 0.75  --   < > = values in this interval not displayed.  Estimated Creatinine Clearance: 104.3 ml/min (by C-G formula based on Cr of 0.75).  Assessment: 57 yo male transferred from [place] following cardiac arrest. At [facility], patient underwent PCI with stenting and IABP placement. During procedure patient with multiple episodes of ventricular tachycardia and ventricular fibrillation requiring cardioversion and amiodarone infusion started. Labs from [place] included Hgb 15.5, plt 256, and SrCr 1.43. Patient transferred with amiodarone and Integrilin infusing. Therapeutic hypothermia initiated and IABP remains. Pharmacy to manage IV heparin.   Follow up heparin level (0.2) is below-goal on 800 units/hr. No problem with line / infusion and slight bleeding noted from OG tube per nursing. Integrilin stopped at noon.   Goal of Therapy:  Heparin level 0.2 - 0.5 units/ml  (therapeutic hypothermia / IABP) Monitor platelets by anticoagulation protocol: Yes   Plan:  1. Increase IV heparin to 950 units/hr. 2. Heparin level in 6 hours  Sheppard Coil PharmD., BCPS Clinical Pharmacist Pager [phone] 10/31/2012 1:29 PM

## 2012-10-31 NOTE — Progress Notes (Signed)
ANTICOAGULATION CONSULT NOTE - Follow Up Consult  Pharmacy Consult for heparin  Indication: S/p cardiac arrest / therapeutic hypothermia / IABP   No Known Allergies  Patient Measurements: Height: 5\' 9"  (175.3 cm) Weight: 168 lb 3.4 oz (76.3 kg) IBW/kg (Calculated) : 70.7 Heparin Dosing Weight: 76 kg   Vital Signs: Temp: 90.5 F (32.5 C) (05/30 0600) Temp src: Core (Comment) (05/30 0500) BP: 108/87 mmHg (05/30 0600) Pulse Rate: 49 (05/30 0600)  Labs:  Recent Labs  10/30/12 2314  10/31/12 0228 10/31/12 0438 10/31/12 0622  HGB  --   < > 17.3* 17.3* 16.4  17.0  HCT  --   < > 51.0 51.0 45.1  50.0  PLT  --   --   --   --  254  APTT 43*  --   --   --   --   LABPROT 16.0*  --   --   --   --   INR 1.31  --   --   --   --   HEPARINUNFRC  --   --   --   --  0.10*  CREATININE  --   < > 0.90 0.80 0.80  < > = values in this interval not displayed.  Estimated Creatinine Clearance: 104.3 ml/min (by C-G formula based on Cr of 0.8).  Medical History: No past medical history on file.  Medications:  No prescriptions prior to admission   Assessment: 57 yo male transferred from [place] following cardiac arrest. At [facility], patient underwent PCI with stenting and IABP placement. During procedure patient with multiple episodes of ventricular tachycardia and ventricular fibrillation requiring cardioversion and amiodarone infusion started. Labs from [place] included Hgb 15.5, plt 256, and SrCr 1.43. Patient transferred with amiodarone and Integrilin infusing. Therapeutic hypothermia initiated and IABP remains. Pharmacy to manage IV heparin.   Heparin level (0.1) is below-goal on 600 units/hr. No problem with line / infusion and no bleeding per RN.   Goal of Therapy:  Heparin level 0.2 - 0.5 units/ml (therapeutic hypothermia / IABP) Monitor platelets by anticoagulation protocol: Yes   Plan:  1. Increase IV heparin to 800 units/hr. 2. Heparin level in 6 hours  Emeline Gins 10/31/2012,6:47 AM

## 2012-10-31 NOTE — Progress Notes (Signed)
Hypokalemia   K replaced  

## 2012-10-31 NOTE — Care Management Note (Addendum)
    Page 1 of 2   11/12/2012     10:40:20 AM   CARE MANAGEMENT NOTE 11/12/2012  Patient:  Walter Frazier, Walter Frazier   Account Number:  [id number]  Date Initiated:  10/31/2012  Documentation initiated by:  Junius Creamer  Subjective/Objective Assessment:   adm w cardiac arrest, vent     Action/Plan:   lives w wife   Anticipated DC Date:     Anticipated DC Plan:        DC Planning Services  CM consult      Choice offered to / List presented to:             Status of service:  In process, will continue to follow Medicare Important Message given?   (If response is "NO", the following Medicare IM given date fields will be blank) Date Medicare IM given:   Date Additional Medicare IM given:    Discharge Disposition:    Per UR Regulation:  Reviewed for med. necessity/level of care/duration of stay  If discussed at Long Length of Stay Meetings, dates discussed:   11/06/2012  11/11/2012    Comments:  Contact:  Walter Frazier   [phone]  6/11 1037a Walter [place] rn,bsn met w wife and pt's sister today. we talked about [facility]ac's. [facility] in network and looking into bcbs guidelines for adm [facility]. wife states pt also has va and goes to [facility]. have alerted [place] [facility] of [facility]'s adm to hospital. alerted [facility] ins verifyers also of va insurance. will cont to follow.[facility] does not have bcbs ins contract.  6/10 1431 Walter [place] rn,bsn pt got trach placed today. on vent. went by to speak w wife but she has gone home for awhile. [facility] has no Art therapist. [facility] has Art therapist and they are ck w pol for elidg and guidelines of what bcbs needs for pt to be ltac elidg. will cont to follow.  11-03-12 2:50pm Walter Frazier, RNBSN [phone] Continues on vent - not waking up.

## 2012-10-31 NOTE — Progress Notes (Signed)
  Echocardiogram 2D Echocardiogram has been performed.  Cathie Beams 10/31/2012, 12:07 PM

## 2012-10-31 NOTE — Procedures (Signed)
Central Venous Catheter Insertion Procedure Note JAMY CLECKLER [id number] 1956/04/11  Procedure: Insertion of Central Venous Catheter Indications: Assessment of intravascular volume and Drug and/or fluid administration  Procedure Details Consent: Unable to obtain consent because of altered level of consciousness. Time Out: Verified patient identification, verified procedure, site/side was marked, verified correct patient position, special equipment/implants available, medications/allergies/relevent history reviewed, required imaging and test results available.  Performed  Maximum sterile technique was used including antiseptics, cap, gloves, gown, hand hygiene, mask and sheet. Skin prep: Chlorhexidine; local anesthetic administered A antimicrobial bonded/coated triple lumen catheter was placed in the left internal jugular vein using the Seldinger technique.  Ultrasound was used to verify the patency of the vein and for real time needle guidance.  Evaluation Blood flow good Complications: No apparent complications Patient did tolerate procedure well. Chest X-ray ordered to verify placement.  CXR: normal.  Thaddus Mcdowell 10/31/2012, 12:31 AM

## 2012-10-31 NOTE — Progress Notes (Signed)
PULMONARY  / CRITICAL CARE MEDICINE  Name: Walter Frazier MRN: [id number] DOB: 01-Jul-1955    ADMISSION DATE:  10/30/2012 CONSULTATION DATE:  10/30/2012  REFERRING MD :  Diona Browner PRIMARY SERVICE: PCCM  CHIEF COMPLAINT:  Cardiac arrest  BRIEF PATIENT DESCRIPTION: 57 y/o male smoker with no known past medical history was admitted to [facility] on 5/29 after cardiac arrest from a STEMI.  He was comatose afterwards and was started on the hypothermia protocol and transferred to [facility].  SIGNIFICANT EVENTS / STUDIES:  5/29 LHC > DES to prox LAD @ [facility]. IABP placed 5/29 Transfer to [facility] for hypothermia 5/30 Echo >>    LINES / TUBES: ETT 5/29 >> IABP 5/29 >> L IJ CVL 5/29 >> L radial A-line 5/29 >>    CULTURES: MRSA PCR 5/29 >> neg  ANTIBIOTICS:   SUBJ: Intubated, sedated, paralyzed  PHYSICAL EXAMINATION: Gen: Intubated, sedated, paralyzed HEENT: WNL PULM: CTA B CV: RRR, balloon pump, no JVD AB: BS+, soft, nontender, no hsm Ext: cool, no edema, no clubbing, no cyanosis Derm: no rash or skin breakdown Neuro: sedated, paralyzed   LABS: BMET    Component Value Date/Time   NA 136 10/31/2012 1151   K 3.1* 10/31/2012 1151   CL 102 10/31/2012 1151   CO2 25 10/31/2012 1151   GLUCOSE 153* 10/31/2012 1151   BUN 14 10/31/2012 1151   CREATININE 0.75 10/31/2012 1151   CALCIUM 7.8* 10/31/2012 1151   GFRNONAA >90 10/31/2012 1151   GFRAA >90 10/31/2012 1151    CBC    Component Value Date/Time   WBC 21.7* 10/31/2012 0622   RBC 5.08 10/31/2012 0622   HGB 16.4 10/31/2012 0622   HGB 17.0 10/31/2012 0622   HCT 45.1 10/31/2012 0622   HCT 50.0 10/31/2012 0622   PLT 254 10/31/2012 0622   MCV 88.8 10/31/2012 0622   MCH 32.3 10/31/2012 0622   MCHC 36.4* 10/31/2012 0622   RDW 13.3 10/31/2012 0622    ABG    Component Value Date/Time   PHART 7.388 10/31/2012 0758   PCO2ART 34.8* 10/31/2012 0758   PO2ART 104.0* 10/31/2012 0758   HCO3 20.9 10/31/2012 0758   TCO2 22 10/31/2012 0758   ACIDBASEDEF 3.0*  10/31/2012 0758   O2SAT 98.0 10/31/2012 0758     Recent Labs Lab 10/31/12 0758 10/31/12 0903 10/31/12 0947 10/31/12 1056 10/31/12 1202  GLUCAP 144* 148* 144* 141* 118*    CXR: NAD  ASSESSMENT / PLAN:  CARDIOVASCULAR A: Cardiac arrest and recurrent VTach STEMI Prox LAD occlusion, s/p DES PCI, P:  -amiodarone, heparin, echo, anti-platelet, and balloon pump per cardiology   PULMONARY A: Acute respiratory failure in the setting of cardiac arrest P:   -full vent support while on hypothermia protocol -ABG in AM, daily CXR   NEUROLOGIC A:  Post anoxic encephalopathy P:   -Cont induced hypothermia protocol   RENAL A:  No acute issues P:   -monitor UOP, BMET   GASTROINTESTINAL A:  No acute issues P:   -PPI -Consider nutrition after rewarming   HEMATOLOGIC A:  No acute issues P:  -monitor for bleeding   INFECTIOUS A:  No acute issues P:     ENDOCRINE A:  No acute issues P:    Code status> Full    TODAY'S SUMMARY:   I have personally obtained a history, examined the patient, evaluated laboratory and imaging results, formulated the assessment and plan and placed orders. CRITICAL CARE: The patient is critically ill with multiple organ systems failure  and requires high complexity decision making for assessment and support, frequent evaluation and titration of therapies, application of advanced monitoring technologies and extensive interpretation of multiple databases. Critical Care Time devoted to patient care services described in this note is 30 minutes.   Sharlot Gowda Pulmonary and Critical Care Medicine [facility] Pager: [phone]  10/31/2012, 3:33 PM

## 2012-10-31 NOTE — Progress Notes (Signed)
IABP placed on 1:2 per Dr. Tenny Craw at bedside @ 1840.  BP at that time was 140/70's.  @ 1900 pt dropped BP 70/50's.  Levophed turned back on at and IABP placed back on 1:1.  Dr. Tenny Craw notified.  Will continue to monitor closely.

## 2012-10-31 NOTE — Progress Notes (Signed)
Mr. Haadi Santellan is a 57 yo man with prior history of 1 ppd tobacco who had out of hospital cardiac arrest and was taken to [facility]. Given ST elevation on ECG in setting of cardiac arrest, pt was taken to the cardiac catheterization where he reportedly had proximal LAD occlusion with atherectomy and DES to proximal LAD.   Wife reports that pt was working on a truck with a friend, pt clutched his chest and collapsed.  A nurse friend along with a firefighter neighbor initiated CPR. EMS called/arrived and CPR in progress with 3 defibrillations reported in the field, probably several more in the ambulance and ER followed by 6-8 defibrillations in the cath lab during revascularization leading to IABP placement. The following were given: 600 mg plavix at 18:11 through NGT, 324 mg aspirin at 18:11 through NGT, angiomax 27 ml/hr as 250 mg/50 ml NS with 61 mg bivalirudin recorded at 17:02, 1 gram Mg 17:24, amiodarone 150 mg at 17:30 with subsequent drip started, integrellin started at 17:46 at 12.3 ml/hr.  IABP with Chest x-ray confirmation of good position and good augmentation.   Subjective:  Intubated and sedated, Still cooling on induced hypothermia protocol, requiring levophed, fentanyl and versed, telemetry with normal sinus mild bradycardia 55 bpm this am, Art line bp 122/79 mmHg  Objective:  Vital Signs in the last 24 hours: Temp:  [89.2 F (31.8 C)-92.5 F (33.6 C)] 90.5 F (32.5 C) (05/30 0600) Pulse Rate:  [39-87] 49 (05/30 0600) Resp:  [16-28] 24 (05/30 0600) BP: (96-184)/(56-151) 108/87 mmHg (05/30 0600) SpO2:  [96 %-100 %] 97 % (05/30 0600) Arterial Line BP: (110-131)/(58-85) 129/65 mmHg (05/30 0600) FiO2 (%):  [34.7 %-50.5 %] 34.7 % (05/30 0600) Weight:  [168 lb 3.4 oz (76.3 kg)] 168 lb 3.4 oz (76.3 kg) (05/29 2310)  Intake/Output from previous day: 05/29 0701 - 05/30 0700 In: 1221.8 [I.V.:1221.8] Out: 682 [Urine:682]  Physical Exam: General: intubated  and sedated Head: Normocephalic, atraumatic. Lungs: Coarse BS Heart: normal rate, regular rhythm, normal S1 and S2, no gallop, murmur, or rubs appreciated. Abdomen: BS normoactive. Soft, Nondistended Extremities: cool  No edema Neurologic: sedated   Lab Results:  Recent Labs  10/31/12 0438 10/31/12 0622  WBC  --  21.7*  HGB 17.3* 16.4  17.0  PLT  --  254    Recent Labs  10/31/12 0438 10/31/12 0622  NA 138 139  K 3.5 3.2*  CL 105 106  GLUCOSE 230* 196*  BUN 17 16  CREATININE 0.80 0.80   No results found for this basename: TROPONINI, CK, MB,  in the last 72 hours  Cardiac Studies: 10/30/2012 Cardiac Catheterization [facility]) proximal LAD occlusion with atherectomy and DES to proximal LAD   Tele: sinus bradycardia, 50-55 bpm  Assessment/Plan:  57 y/o male smoker with no known past medical history transfered from [facility] on 5/29 after cardiac arrest from a STEMI and subsequent cardiac catheterization. Of note, he had persistent V-Tach afterwards requiring multiple shocks. Now on induced hypothermia protocol.  # Cardiac Arrest with Recurrent Ventricular Tachycardia: Continue on amiodarone ggt.  Echo to evaluate LVEF once extubated.    # STEMI: s/p PTCA/DES to proximal LAD.  Finishing infusion of bivalirudin. REmains on balloon pump for now.   Continue ASA, Plavix.  received aspirin    Will Order 2D echo after extubated to evaluate LVEF.   #Tobacco abuse  Need to counsel when awake/alert.  Lipids  Check lipids  May be low with recent event.  WIll need a statin when taking PO   Kristie Cowman, M.D. 10/31/2012, 6:49 AM  Patient seen and examined  I have amended note by Dr Bosie Clos to reflect my findings.  Plans as noted.

## 2012-10-31 NOTE — H&P (Signed)
PULMONARY  / CRITICAL CARE MEDICINE  Name: Walter Frazier MRN: [id number] DOB: 06/16/1955    ADMISSION DATE:  10/30/2012 CONSULTATION DATE:  10/30/2012  REFERRING MD :  Diona Browner PRIMARY SERVICE: PCCM  CHIEF COMPLAINT:  Cardiac arrest  BRIEF PATIENT DESCRIPTION: 57 y/o male smoker with no known past medical history was admitted to [facility] on 5/29 after cardiac arrest from a STEMI.  He was comatose afterwards and was started on the hypothermia protocol and transferred to [facility].  SIGNIFICANT EVENTS / STUDIES:  5/29 admission 5/29 LHC > DES to prox LAD  LINES / TUBES: 5/29 ETT >> 5/29 balloon pump >> 5/29 L IJ CVL >>  CULTURES:   ANTIBIOTICS:   HISTORY OF PRESENT ILLNESS:  57 y/o male smoker with no known past medical history was admitted to [facility] on 5/29 after cardiac arrest from a STEMI.  He was comatose afterwards and was started on the hypothermia protocol and transferred to [facility].  His family stated that he was helping a friend fix up a truck on 5/29 and then he suddenly grasped his chest and collapsed.  Apparently the next door neighbor is a Theatre stage manager and CPR was started soon after he collapsed.  He received 15-20 minutes CPR and was shocked x3 (unclear what rhythm). In the cath lab he had a proximal LAD lesion opened with a DES and had persistent V-Tach afterwards.  Amiodarone was started and a balloon pump was placed and he was started on a hypothermia protocol and transfer was requested to [facility].  PAST MEDICAL HISTORY :  No past medical history on file. No past surgical history on file. Prior to Admission medications   None   No Known Allergies  FAMILY HISTORY:  Family reports no known cardiac disease in first degree relatives  SOCIAL HISTORY:  reports that he has been smoking Cigarettes.  He has been smoking about 1.00 pack per day. He does not have any smokeless tobacco history on file. He reports that he drinks about 6.0 ounces of alcohol per week. He reports that he  does not use illicit drugs.  REVIEW OF SYSTEMS:  Cannot obtain due to intubation  SUBJECTIVE:   VITAL SIGNS: Temp:  [89.2 F (31.8 C)-91.8 F (33.2 C)] 91.8 F (33.2 C) (05/29 2330) Pulse Rate:  [39-87] 70 (05/29 2349) Resp:  [16-28] 19 (05/29 2349) BP: (124-184)/(81-151) 124/81 mmHg (05/29 2345) SpO2:  [97 %-100 %] 97 % (05/29 2349) FiO2 (%):  [49.9 %-50 %] 50 % (05/29 2349) Weight:  [76.3 kg (168 lb 3.4 oz)] 76.3 kg (168 lb 3.4 oz) (05/29 2310) HEMODYNAMICS:   VENTILATOR SETTINGS: Vent Mode:  [-] PRVC FiO2 (%):  [49.9 %-50 %] 50 % Set Rate:  [20 bmp] 20 bmp Vt Set:  [500 mL] 500 mL PEEP:  [5 cmH20] 5 cmH20 Plateau Pressure:  [16 cmH20-17 cmH20] 17 cmH20 INTAKE / OUTPUT: Intake/Output   None     PHYSICAL EXAMINATION:  Gen: sedated on vent HEENT: NCAT, PERRL, EOMi, ETT in place PULM: CTA B CV: RRR, balloon pump, no JVD AB: BS+, soft, nontender, no hsm Ext: cool, no edema, no clubbing, no cyanosis Derm: no rash or skin breakdown Neuro: GCS 3   LABS: No results found for this basename: HGB, WBC, PLT, NA, K, CL, CO2, GLUCOSE, BUN, CREATININE, CALCIUM, MG, PHOS, AST, ALT, ALKPHOS, BILITOT, PROT, ALBUMIN, APTT, INR, LATICACIDVEN, TROPONINI, PROCALCITON, PROBNP, O2SATVEN, PHART, PCO2ART, PO2ART,  in the last 168 hours No results found for this basename: GLUCAP,  in the last 168 hours  5/29 CXR: ETT in place, L IJ CVL in place 5/29 EKG:   ASSESSMENT / PLAN:  NEUROLOGIC A:  High risk for anoxic brain injury P:   -induced hypothermia protocol  PULMONARY A: Acute respiratory failure in the setting of cardiac arrest P:   -full vent support while on hypothermia protocol -ABG in AM, daily CXR  CARDIOVASCULAR A: Cardiac arrest and recurrent VTach in setting of STEMI from prox LAD occlusion, now s/p DES PCI, balloon pump, amiodarone; total CPR time 15-20 minutes P:  -amiodarone, heparin, echo, anti-platelet, and balloon pump per cardiology  RENAL A:  No acute  issues P:   -monitor UOP  GASTROINTESTINAL A:  No acute issues P:   -npo -og tube -pantoprazole for stress ulcer proph  HEMATOLOGIC A:  No acute issues P:  -monitor for bleeding  INFECTIOUS A:  No acute issues P:     ENDOCRINE A:  No acute issues P:    Code status> Full  Family (wife, son) updated at bedside at length by me on 5/30   TODAY'S SUMMARY:   I have personally obtained a history, examined the patient, evaluated laboratory and imaging results, formulated the assessment and plan and placed orders. CRITICAL CARE: The patient is critically ill with multiple organ systems failure and requires high complexity decision making for assessment and support, frequent evaluation and titration of therapies, application of advanced monitoring technologies and extensive interpretation of multiple databases. Critical Care Time devoted to patient care services described in this note is 60 minutes.   Fonnie Jarvis Pulmonary and Critical Care Medicine [facility] Pager: [phone]  10/31/2012, 12:08 AM

## 2012-10-31 NOTE — Progress Notes (Addendum)
1. Hyperglycemia   ICU hyperglycemia protocol.   2. ABG reviewed   Acute metabolic acidosis; initiate bicarb drip. evaluate ABG in 30 min

## 2012-10-31 NOTE — Procedures (Signed)
Arterial Catheter Insertion Procedure Note Walter Frazier [id number] [birth year]  Procedure: Insertion of Arterial Catheter  Indications: Blood pressure monitoring and Frequent blood sampling  Procedure Details Consent: Unable to obtain consent because of altered level of consciousness.  Time Out: Verified patient identification, verified procedure, site/side was marked, verified correct patient position, special equipment/implants available, medications/allergies/relevent history reviewed, required imaging and test results available.  Performed  Maximum sterile technique was used including antiseptics, gloves, gown, hand hygiene and mask . Skin prep: Chlorhexidine; local anesthetic administered 22 gauge catheter was inserted into left radial artery using the Seldinger technique.  Evaluation Blood flow good; BP tracing good. Complications: No apparent complications.     Walter Frazier 10/31/2012

## 2012-11-01 ENCOUNTER — Inpatient Hospital Stay (HOSPITAL_COMMUNITY): Payer: BC Managed Care – PPO

## 2012-11-01 DIAGNOSIS — R570 Cardiogenic shock: Secondary | ICD-10-CM

## 2012-11-01 LAB — GLUCOSE, CAPILLARY
Glucose-Capillary: 134 mg/dL — ABNORMAL HIGH (ref 70–99)
Glucose-Capillary: 119 mg/dL — ABNORMAL HIGH (ref 70–99)
Glucose-Capillary: 110 mg/dL — ABNORMAL HIGH (ref 70–99)
Glucose-Capillary: 125 mg/dL — ABNORMAL HIGH (ref 70–99)
Glucose-Capillary: 91 mg/dL (ref 70–99)

## 2012-11-01 LAB — POCT I-STAT 3, ART BLOOD GAS (G3+)
Acid-base deficit: 2 mmol/L (ref 0.0–2.0)
Bicarbonate: 21.6 mEq/L (ref 20.0–24.0)
O2 Saturation: 95 %
Patient temperature: 36.2
pCO2 arterial: 32.9 mmHg — ABNORMAL LOW (ref 35.0–45.0)
pH, Arterial: 7.399 (ref 7.350–7.450)
pH, Arterial: 7.443 (ref 7.350–7.450)
pO2, Arterial: 77 mmHg — ABNORMAL LOW (ref 80.0–100.0)

## 2012-11-01 LAB — BASIC METABOLIC PANEL
BUN: 11 mg/dL (ref 6–23)
BUN: 10 mg/dL (ref 6–23)
CO2: 19 mEq/L (ref 19–32)
CO2: 20 mEq/L (ref 19–32)
Calcium: 7.9 mg/dL — ABNORMAL LOW (ref 8.4–10.5)
Calcium: 7.9 mg/dL — ABNORMAL LOW (ref 8.4–10.5)
Calcium: 8 mg/dL — ABNORMAL LOW (ref 8.4–10.5)
Chloride: 103 mEq/L (ref 96–112)
Chloride: 102 mEq/L (ref 96–112)
Chloride: 103 mEq/L (ref 96–112)
Creatinine, Ser: 0.62 mg/dL (ref 0.50–1.35)
Creatinine, Ser: 0.71 mg/dL (ref 0.50–1.35)
GFR calc Af Amer: >90 mL/min (ref >90)
GFR calc Af Amer: >90 mL/min (ref >90)
GFR calc Af Amer: >90 mL/min (ref >90)
GFR calc Af Amer: >90 mL/min (ref >90)
GFR calc non Af Amer: >90 mL/min (ref >90)
GFR calc non Af Amer: >90 mL/min (ref >90)
GFR calc non Af Amer: >90 mL/min (ref >90)
GFR calc non Af Amer: >90 mL/min (ref >90)
GFR calc non Af Amer: >90 mL/min (ref >90)
Glucose, Bld: 151 mg/dL — ABNORMAL HIGH (ref 70–99)
Glucose, Bld: 147 mg/dL — ABNORMAL HIGH (ref 70–99)
Potassium: 3.6 mEq/L (ref 3.5–5.1)
Potassium: 3.8 mEq/L (ref 3.5–5.1)
Potassium: 3.7 mEq/L (ref 3.5–5.1)
Potassium: 4.2 mEq/L (ref 3.5–5.1)
Sodium: 134 mEq/L — ABNORMAL LOW (ref 135–145)
Sodium: 135 mEq/L (ref 135–145)
Sodium: 135 mEq/L (ref 135–145)
Sodium: 136 mEq/L (ref 135–145)
Sodium: 136 mEq/L (ref 135–145)

## 2012-11-01 LAB — CBC
MCHC: 35.9 g/dL (ref 30.0–36.0)
RDW: 13.3 % (ref 11.5–15.5)

## 2012-11-01 LAB — MAGNESIUM: Magnesium: 1.7 mg/dL (ref 1.5–2.5)

## 2012-11-01 LAB — HEPARIN LEVEL (UNFRACTIONATED)
Heparin Unfractionated: 0.67 IU/mL (ref 0.30–0.70)
Heparin Unfractionated: 0.27 IU/mL — ABNORMAL LOW (ref 0.30–0.70)

## 2012-11-01 LAB — PHOSPHORUS: Phosphorus: 2.2 mg/dL — ABNORMAL LOW (ref 2.3–4.6)

## 2012-11-01 MED ORDER — ATORVASTATIN CALCIUM 80 MG PO TABS
80.0000 mg | ORAL_TABLET | Freq: Every day | ORAL | Status: DC
  Administered 2012-11-01 – 2012-11-09 (×9): 80 mg
  Filled 2012-11-01 (×10): qty 1

## 2012-11-01 NOTE — Progress Notes (Addendum)
PT bitting ETT, bite block placed. IABP waveform dampened, reposition pt, refilled balloon and recal pump. Best wave form with HOB at 10-15 degrees.

## 2012-11-01 NOTE — Progress Notes (Signed)
Orthopedic Tech Progress Note Patient Details:  Walter Frazier 02/01/1956 [id number]  Ortho Devices Type of Ortho Device: Knee Immobilizer Ortho Device/Splint Interventions: Application   Cammer, Mickie Bail 11/01/2012, 3:00 PM

## 2012-11-01 NOTE — Progress Notes (Signed)
Patient ID: Walter Frazier, male   DOB: Oct 29, 1955, 57 y.o.   MRN: [id number]    SUBJECTIVE: Stable this morning, being re-warmed.  On norepinephrine to keep MAP 85.  No further VT on amiodarone gtt.  Balloon pump augmenting appropriately at 1:1.  CVP 6.   Marland Kitchen amiodarone  200 mg Oral BID  . antiseptic oral rinse  15 mL Mouth Rinse QID  . artificial tears  1 application Both Eyes Q8H  . aspirin  81 mg Oral Daily  . atorvastatin  80 mg Per Tube q1800  . chlorhexidine  15 mL Mouth Rinse BID  . clopidogrel  75 mg Oral Q breakfast  . insulin aspart  2-6 Units Subcutaneous Q4H  . insulin glargine  10 Units Subcutaneous Q24H  . pantoprazole sodium  40 mg Per Tube QHS  amiodarone gtt @ 30 Norepinephrine gtt @ 20 Cisatracurium gtt Versed gtt Fentanyl gtt Heparin gtt    Filed Vitals:   11/01/12 0500 11/01/12 0600 11/01/12 0700 11/01/12 0745  BP: 114/59 113/57 114/61 144/82  Pulse: 56 57 61 74  Temp: 93.4 F (34.1 C) 93.7 F (34.3 C) 94.5 F (34.7 C)   TempSrc: Core (Comment) Core (Comment) Core (Comment)   Resp: 24 24 24 24   Height:      Weight:      SpO2: 98% 98% 97% 99%    Intake/Output Summary (Last 24 hours) at 11/01/12 0758 Last data filed at 11/01/12 0700  Gross per 24 hour  Intake 3222.96 ml  Output   2115 ml  Net 1107.96 ml    LABS: Basic Metabolic Panel:  Recent Labs  40/98/11 2340 11/01/12 0400  NA 136 137  K 3.8 3.7  CL 102 103  CO2 19 20  GLUCOSE 159* 151*  BUN 11 11  CREATININE 0.62 0.71  CALCIUM 7.9* 8.1*   Liver Function Tests: No results found for this basename: AST, ALT, ALKPHOS, BILITOT, PROT, ALBUMIN,  in the last 72 hours No results found for this basename: LIPASE, AMYLASE,  in the last 72 hours CBC:  Recent Labs  10/31/12 0622 11/01/12 0400  WBC 21.7* 28.3*  HGB 16.4  17.0 16.2  HCT 45.1  50.0 45.1  MCV 88.8 87.7  PLT 254 260   Cardiac Enzymes: No results found for this basename: CKTOTAL, CKMB, CKMBINDEX, TROPONINI,  in the  last 72 hours BNP: No components found with this basename: POCBNP,  D-Dimer: No results found for this basename: DDIMER,  in the last 72 hours Hemoglobin A1C:  Recent Labs  10/31/12 0035  HGBA1C 5.8*   Fasting Lipid Panel: No results found for this basename: CHOL, HDL, LDLCALC, TRIG, CHOLHDL, LDLDIRECT,  in the last 72 hours Thyroid Function Tests: No results found for this basename: TSH, T4TOTAL, FREET3, T3FREE, THYROIDAB,  in the last 72 hours Anemia Panel: No results found for this basename: VITAMINB12, FOLATE, FERRITIN, TIBC, IRON, RETICCTPCT,  in the last 72 hours  RADIOLOGY: Dg Chest Port 1 View  11/01/2012   *RADIOLOGY REPORT*  Clinical Data: Respiratory failure  PORTABLE CHEST - 1 VIEW  Comparison:   the previous day's study  Findings: Endotracheal tube, nasogastric tube, and left IJ central line are stable in position.  IABP tip projects at the lower margin of the aortic arch as before.  Left retrocardiac consolidation / atelectasis slightly increased.  Right lung clear.  No effusion.  IMPRESSION:  1.  Some increase in left retrocardiac consolidation / atelectasis. 2. Support hardware stable in position.  Original Report Authenticated By: D. Andria Rhein, MD   Dg Chest Port 1 View  10/31/2012   *RADIOLOGY REPORT*  Clinical Data: Repositioning of the intra-aortic balloon pump.  PORTABLE CHEST - 1 VIEW  Comparison: Chest radiograph performed earlier today at 01:22 a.m.  Findings: The patient's intra-aortic balloon pump tip is seen at the aortic arch, approximately 1.5 cm above the level of the carina.  This could be retracted 1.5 cm, if deemed clinically necessary.  The left IJ line is noted ending about the distal SVC.  An external pacing pad is again noted overlying the left hemithorax.  The patient's endotracheal tube is seen ending 4 cm above the carina.  The lungs are well-aerated and appear grossly clear.  There is no evidence of focal opacification, pleural effusion or  pneumothorax.  The cardiomediastinal silhouette is within normal limits.  No acute osseous abnormalities are seen.  IMPRESSION:  1.  Intra-aortic balloon pump tip noted at the aortic arch, 1.5 cm above the level of the carina.  This could be retracted 1.5 cm, if deemed clinically necessary. 2.  No other changes from the prior study.   Original Report Authenticated By: Tonia Ghent, M.D.   Dg Chest Portable 1 View  10/31/2012   *RADIOLOGY REPORT*  Clinical Data: Moved intra-aortic balloon and endotracheal tube.  PORTABLE CHEST - 1 VIEW  Comparison: Chest radiograph performed earlier today at 12:05 a.m.  Findings: The patient's endotracheal tube is seen ending 4 cm above the carina.  The patient's left IJ line is noted ending about the distal SVC. The intra-aortic balloon pump tip is still noted overlying the aortic knob, approximately 2 cm more superior than expected.  The lungs are clear bilaterally.  No focal consolidation, pleural effusion or pneumothorax is seen.  A left-sided external pacing pad is again noted.  The patient's enteric tube is seen ending at the fundus of the stomach.  No acute osseous abnormalities are seen.  IMPRESSION:  1.  Intra-aortic balloon pump tip still seen overlying the aortic arch, above the level of the carina, approximately 2 cm more superior than expected.  Would retract this by 2 cm, as deemed clinically appropriate. 2.  Endotracheal tube noted in expected position, ending 4 cm above the carina. 3.  No acute cardiopulmonary process seen.   Original Report Authenticated By: Tonia Ghent, M.D.   Dg Chest Portable 1 View  10/31/2012   *RADIOLOGY REPORT*  Clinical Data: Central line placement.  PORTABLE CHEST - 1 VIEW  Comparison: Chest radiograph performed 10/30/2012  Findings: The patient's endotracheal tube is seen ending 6-7 cm above the carina.  A left IJ line is noted ending about the distal SVC.  An enteric tube is noted extending below the diaphragm.  The patient's  intra-aortic balloon pump tip has migrated approximately 2 cm superiorly since the prior study.  The lungs are well expanded.  Minimal left basilar density is thought to reflect normal vasculature and the overlying external pacing pad.  No pleural effusion or pneumothorax is seen.  The cardiomediastinal silhouette remains normal in size.  No acute osseous abnormalities are seen.  IMPRESSION:  1.  Left IJ line noted ending about the distal SVC. 2.  Endotracheal tube seen ending 6-7 cm above the carina.  Would advance this by 3-4 cm. 3.  Intra-aortic balloon pump tip has migrated 2 cm superiorly since the prior study. Would retract this by 2 cm. 4.  No acute cardiopulmonary process seen.  These results were called  by telephone on 10/31/2012 at 12:31 a.m. to Nursing on [id number], who verbally acknowledged these results.   Original Report Authenticated By: Tonia Ghent, M.D.   Dg Chest Port 1 View  10/30/2012   *RADIOLOGY REPORT*  Clinical Data: Assess intra-aortic balloon pump.  PORTABLE CHEST - 1 VIEW  Comparison: None.  Findings: The patient's endotracheal tube is seen ending 5-6 cm above the carina.  An enteric tube is noted extending below the diaphragm.  The tip of the intra-aortic balloon pump is seen just below the aortic arch, just above the level of the carina, in appropriate position.  The lungs are relatively well expanded.  Mild vascular congestion is noted, without definite pulmonary edema.  The external pacing pad is noted overlying the left hemithorax.  No pleural effusion or pneumothorax is seen.  The cardiomediastinal silhouette is normal in size.  No acute osseous abnormalities are identified.  IMPRESSION:  1.  Tip of the intra-aortic balloon pump noted just above the level of the carina and just below the aortic arch, in appropriate position. 2.  Endotracheal tube seen ending 5-6 cm above the carina. 3.  Mild vascular congestion, without definite pulmonary edema.   Original Report Authenticated  By: Tonia Ghent, M.D.    PHYSICAL EXAM General: Intubated, sedated Neck: No JVD, no thyromegaly or thyroid nodule.  Lungs: Clear to auscultation bilaterally with normal respiratory effort. CV: Nondisplaced PMI.  Heart regular S1/S2, no S3/S4, no murmur.  No peripheral edema.   Abdomen: Soft, no hepatosplenomegaly, no distention.  Neurologic: Sedated  Psych: Normal affect. Extremities: No clubbing or cyanosis.   TELEMETRY: Reviewed telemetry pt in NSR  ASSESSMENT AND PLAN: 57 yo had VT arrest yesterday with anterior STEMI.  Had DES to pLAD, IABP placed and patient placed on hypothermia protocol.   1. CAD: Anterior STEMI s/p DES to pLAD.  Continue ASA 81, Plavix, add atorvastatin 80.  2. Cardiogenic shock:  EF 25% on echo with wall motion abnormalities consistent with anterior MI.   IABP at 1:1 with norepinephrine to keep MAP 85 given hypothermia protocol.  After rewarming, will work on weaning off norepi and will try to get IABP out tomorrow after rewarming and pressor weaning. Continue heparin gtt while IABP in place.  3. VT: Occurred in setting of anterior MI.  No further VT overnight.  Continue amiodarone for now.  Will need Lifevest at discharge with echo in 3 months to determine need for ICD.   Marca Ancona 11/01/2012 8:05 AM

## 2012-11-01 NOTE — Progress Notes (Signed)
Hiccups x 3o min   Check bmet, mag, phos, calcium Check cxr   Dr. Kalman Shan, M.D., [facility].C.P Pulmonary and Critical Care Medicine Staff Physician [facility] [place] [facility] Pager: [phone], If no answer or between  15:00h - 7:00h: call [phone]  11/01/2012 11:00 PM

## 2012-11-01 NOTE — Progress Notes (Signed)
PT fighting vent, trying to sit up, sedation restarted

## 2012-11-01 NOTE — Progress Notes (Signed)
PULMONARY  / CRITICAL CARE MEDICINE  Name: Walter Frazier MRN: [id number] DOB: 20-Apr-1956    ADMISSION DATE:  10/30/2012 CONSULTATION DATE:  10/30/2012  REFERRING MD :  Diona Browner PRIMARY SERVICE: PCCM  CHIEF COMPLAINT:  Cardiac arrest  BRIEF PATIENT DESCRIPTION: 57 y/o male smoker with no known past medical history was admitted to [facility] on 5/29 after cardiac arrest from a STEMI.  He was comatose afterwards and was started on the hypothermia protocol and transferred to [facility].  SIGNIFICANT EVENTS / STUDIES:  5/29 LHC > DES to prox LAD @ [facility]. IABP placed 5/29 Transfer to [facility] for hypothermia 5/30 Echo >> EF 25%, LV severely dilated  LINES / TUBES: ETT 5/29 >> IABP 5/29 >> L IJ CVL 5/29 >> L radial A-line 5/29 >>    CULTURES: MRSA PCR 5/29 >> neg  ANTIBIOTICS: None    SUBJ: Intubated, sedated, paralyzed.  Rewarming to be complete ~3pm.  DId not tol attempts to wean AIBP to 1:2, back to 1:1.   PHYSICAL EXAMINATION: Gen: Intubated, sedated, paralyzed HEENT: WNL PULM: CTA B CV: RRR, balloon pump, no JVD AB: BS+, soft, nontender, no hsm Ext: cool, no edema, no clubbing, no cyanosis Derm: no rash or skin breakdown Neuro: sedated, paralyzed   LABS: BMET  Recent Labs Lab 10/31/12 1728 10/31/12 2000 10/31/12 2340 11/01/12 0400 11/01/12 0800  NA 134* 135 136 137 136  K 3.6 3.8 3.8 3.7 3.6  CL 101 100 102 103 101  CO2 23 20 19 20 21   GLUCOSE 112* 144* 159* 151* 147*  BUN 12 12 11 11 11   CREATININE 0.64 0.61 0.62 0.71 0.75  CALCIUM 7.8* 7.8* 7.9* 8.1* 8.0*     CBC  Recent Labs Lab 10/31/12 0438 10/31/12 0622 11/01/12 0400  HGB 17.3* 16.4  17.0 16.2  HCT 51.0 45.1  50.0 45.1  WBC  --  21.7* 28.3*  PLT  --  254 260    ABG    Component Value Date/Time   PHART 7.399 11/01/2012 0418   PCO2ART 32.9* 11/01/2012 0418   PO2ART 77.0* 11/01/2012 0418   HCO3 20.4 11/01/2012 0418   TCO2 21 11/01/2012 0418   ACIDBASEDEF 3.0* 11/01/2012 0418   O2SAT 95.0  11/01/2012 0418     Recent Labs Lab 10/31/12 1710 10/31/12 1958 10/31/12 2331 11/01/12 0357 11/01/12 0734  GLUCAP 115* 113* 135* 134* 119*    CXR:  Dg Chest Port 1 View  11/01/2012   *RADIOLOGY REPORT*  Clinical Data: Respiratory failure  PORTABLE CHEST - 1 VIEW  Comparison:   the previous day's study  Findings: Endotracheal tube, nasogastric tube, and left IJ central line are stable in position.  IABP tip projects at the lower margin of the aortic arch as before.  Left retrocardiac consolidation / atelectasis slightly increased.  Right lung clear.  No effusion.  IMPRESSION:  1.  Some increase in left retrocardiac consolidation / atelectasis. 2. Support hardware stable in position.   Original Report Authenticated By: D. Andria Rhein, MD   Dg Chest Port 1 View  10/31/2012   *RADIOLOGY REPORT*  Clinical Data: Repositioning of the intra-aortic balloon pump.  PORTABLE CHEST - 1 VIEW  Comparison: Chest radiograph performed earlier today at 01:22 a.m.  Findings: The patient's intra-aortic balloon pump tip is seen at the aortic arch, approximately 1.5 cm above the level of the carina.  This could be retracted 1.5 cm, if deemed clinically necessary.  The left IJ line is noted ending about the distal SVC.  An external pacing pad is again noted overlying the left hemithorax.  The patient's endotracheal tube is seen ending 4 cm above the carina.  The lungs are well-aerated and appear grossly clear.  There is no evidence of focal opacification, pleural effusion or pneumothorax.  The cardiomediastinal silhouette is within normal limits.  No acute osseous abnormalities are seen.  IMPRESSION:  1.  Intra-aortic balloon pump tip noted at the aortic arch, 1.5 cm above the level of the carina.  This could be retracted 1.5 cm, if deemed clinically necessary. 2.  No other changes from the prior study.   Original Report Authenticated By: Tonia Ghent, M.D.   Dg Chest Portable 1 View  10/31/2012   *RADIOLOGY REPORT*   Clinical Data: Moved intra-aortic balloon and endotracheal tube.  PORTABLE CHEST - 1 VIEW  Comparison: Chest radiograph performed earlier today at 12:05 a.m.  Findings: The patient's endotracheal tube is seen ending 4 cm above the carina.  The patient's left IJ line is noted ending about the distal SVC. The intra-aortic balloon pump tip is still noted overlying the aortic knob, approximately 2 cm more superior than expected.  The lungs are clear bilaterally.  No focal consolidation, pleural effusion or pneumothorax is seen.  A left-sided external pacing pad is again noted.  The patient's enteric tube is seen ending at the fundus of the stomach.  No acute osseous abnormalities are seen.  IMPRESSION:  1.  Intra-aortic balloon pump tip still seen overlying the aortic arch, above the level of the carina, approximately 2 cm more superior than expected.  Would retract this by 2 cm, as deemed clinically appropriate. 2.  Endotracheal tube noted in expected position, ending 4 cm above the carina. 3.  No acute cardiopulmonary process seen.   Original Report Authenticated By: Tonia Ghent, M.D.   Dg Chest Portable 1 View  10/31/2012   *RADIOLOGY REPORT*  Clinical Data: Central line placement.  PORTABLE CHEST - 1 VIEW  Comparison: Chest radiograph performed 10/30/2012  Findings: The patient's endotracheal tube is seen ending 6-7 cm above the carina.  A left IJ line is noted ending about the distal SVC.  An enteric tube is noted extending below the diaphragm.  The patient's intra-aortic balloon pump tip has migrated approximately 2 cm superiorly since the prior study.  The lungs are well expanded.  Minimal left basilar density is thought to reflect normal vasculature and the overlying external pacing pad.  No pleural effusion or pneumothorax is seen.  The cardiomediastinal silhouette remains normal in size.  No acute osseous abnormalities are seen.  IMPRESSION:  1.  Left IJ line noted ending about the distal SVC. 2.   Endotracheal tube seen ending 6-7 cm above the carina.  Would advance this by 3-4 cm. 3.  Intra-aortic balloon pump tip has migrated 2 cm superiorly since the prior study. Would retract this by 2 cm. 4.  No acute cardiopulmonary process seen.  These results were called by telephone on 10/31/2012 at 12:31 a.m. to Nursing on [id number], who verbally acknowledged these results.   Original Report Authenticated By: Tonia Ghent, M.D.   Dg Chest Port 1 View  10/30/2012   *RADIOLOGY REPORT*  Clinical Data: Assess intra-aortic balloon pump.  PORTABLE CHEST - 1 VIEW  Comparison: None.  Findings: The patient's endotracheal tube is seen ending 5-6 cm above the carina.  An enteric tube is noted extending below the diaphragm.  The tip of the intra-aortic balloon pump is seen just below the aortic arch,  just above the level of the carina, in appropriate position.  The lungs are relatively well expanded.  Mild vascular congestion is noted, without definite pulmonary edema.  The external pacing pad is noted overlying the left hemithorax.  No pleural effusion or pneumothorax is seen.  The cardiomediastinal silhouette is normal in size.  No acute osseous abnormalities are identified.  IMPRESSION:  1.  Tip of the intra-aortic balloon pump noted just above the level of the carina and just below the aortic arch, in appropriate position. 2.  Endotracheal tube seen ending 5-6 cm above the carina. 3.  Mild vascular congestion, without definite pulmonary edema.   Original Report Authenticated By: Tonia Ghent, M.D.     ASSESSMENT / PLAN:  CARDIOVASCULAR A: Cardiac arrest and recurrent VTach STEMI Shock/ hypotension - pressor dependent  Prox LAD occlusion, s/p DES PCI, P:  - amiodarone, heparin, anti-platelet, and balloon pump per cardiology - balloon pump to remain for now , did not tol wean from 1:1 to 1:2 - remains on mod dose pressors, wean as able    PULMONARY A: Acute respiratory failure in the setting of cardiac  arrest P:   -full vent support while on hypothermia protocol - increase FiO2, recheck ABG  -ABG in AM, daily CXR   NEUROLOGIC A:  Post anoxic encephalopathy P:   -Cont induced hypothermia protocol -monitor mental status after rewarming    RENAL A:  No acute issues P:   -monitor UOP, BMET   GASTROINTESTINAL A:  No acute issues P:   -PPI -Consider nutrition after rewarming   HEMATOLOGIC A:  No acute issues P:  -f/u cbc  - cont heparin    INFECTIOUS A:  No acute issues P:   - monitor off abx   ENDOCRINE A:  No acute issues P:    Code status> Full  WHITEHEART,KATHRYN, NP 11/01/2012  9:34 AM Pager: [phone] or [phone]  I have personally obtained a history, examined the patient, evaluated laboratory and imaging results, formulated the assessment and plan and placed orders. CRITICAL CARE: The patient is critically ill with multiple organ systems failure and requires high complexity decision making for assessment and support, frequent evaluation and titration of therapies, application of advanced monitoring technologies and extensive interpretation of multiple databases. Critical Care Time devoted to patient care services described in this note is 35 minutes.   *Care during the described time interval was provided by me and/or other providers on the critical care team. I have reviewed this patient's available data, including medical history, events of note, physical examination and test results as part of my evaluation.  Patient seen and examined, agree with above note.  I dictated the care and orders written for this patient under my direction.  Alyson Reedy, MD [phone]

## 2012-11-01 NOTE — Progress Notes (Addendum)
ANTICOAGULATION CONSULT NOTE - Follow Up Consult  Pharmacy Consult for heparin  Indication: S/p cardiac arrest / therapeutic hypothermia / IABP   No Known Allergies  Patient Measurements: Height: 5\' 9"  (175.3 cm) Weight: 168 lb 3.4 oz (76.3 kg) IBW/kg (Calculated) : 70.7 Heparin Dosing Weight: 76 kg   Vital Signs: Temp: 94.5 F (34.7 C) (05/31 0700) Temp src: Core (Comment) (05/31 0700) BP: 114/61 mmHg (05/31 0700) Pulse Rate: 61 (05/31 0700)  Labs:  Recent Labs  10/30/12 2314  10/31/12 0438  10/31/12 0622 10/31/12 0829  10/31/12 1211  10/31/12 2000 10/31/12 2340 11/01/12 0400  HGB  --   < > 17.3*  --  16.4  17.0  --   --   --   --   --   --  16.2  HCT  --   < > 51.0  --  45.1  50.0  --   --   --   --   --   --  45.1  PLT  --   --   --   --  254  --   --   --   --   --   --  260  APTT 43*  --   --   --   --  49*  --   --   --   --   --   --   LABPROT 16.0*  --   --   --   --  14.3  --   --   --   --   --   --   INR 1.31  --   --   --   --  1.13  --   --   --   --   --   --   HEPARINUNFRC  --   --   --   < > 0.10*  --   --  0.20*  --  0.41  --  0.67  CREATININE  --   < > 0.80  --  0.80  --   < >  --   < > 0.61 0.62 0.71  < > = values in this interval not displayed.  Estimated Creatinine Clearance: 104.3 ml/min (by C-G formula based on Cr of 0.71).  Assessment: 57 yo male transferred from [place] following cardiac arrest. At [facility], patient underwent PCI with stenting and IABP placement. During procedure patient with multiple episodes of ventricular tachycardia and ventricular fibrillation requiring cardioversion and amiodarone infusion started.  Patient transferred with amiodarone and Integrilin infusing.  Integrilin was stopped at 1200 noon d/t blood tinged sputum. Therapeutic hypothermia initiated and IABP remains. Pharmacy to manage IV heparin.  Heparin drip 950 uts/hr with HL 0.67   Goal of Therapy:  Heparin level 0.2 - 0.5 units/ml (therapeutic hypothermia /  IABP) Monitor platelets by anticoagulation protocol: Yes   Plan:  1. Decrease IV heparin to 850 units/hr. 2. 6-hr Heparin level and CBC daily 3. Monitor for bleeding  Thank you, Franchot Erichsen, Pharm.D. Clinical Pharmacist   Pager: [phone] 11/01/2012 7:39 AM  Addendum HL therapeutic at 850 units/hr based on hypothermia protocol.   Plan 1. Continue Heparin IV infusion 850 units/hr.  2. Daily CBC

## 2012-11-02 ENCOUNTER — Inpatient Hospital Stay (HOSPITAL_COMMUNITY): Payer: BC Managed Care – PPO

## 2012-11-02 LAB — GLUCOSE, CAPILLARY
Glucose-Capillary: 96 mg/dL (ref 70–99)
Glucose-Capillary: 97 mg/dL (ref 70–99)

## 2012-11-02 LAB — CALCIUM, IONIZED: Calcium, Ion: 1.14 mmol/L (ref 1.12–1.23)

## 2012-11-02 LAB — COMPREHENSIVE METABOLIC PANEL
AST: 114 U/L — ABNORMAL HIGH (ref 0–37)
CO2: 24 mEq/L (ref 19–32)
Calcium: 8.2 mg/dL — ABNORMAL LOW (ref 8.4–10.5)
Creatinine, Ser: 0.88 mg/dL (ref 0.50–1.35)
GFR calc Af Amer: >90 mL/min (ref >90)
GFR calc non Af Amer: >90 mL/min (ref >90)
Glucose, Bld: 105 mg/dL — ABNORMAL HIGH (ref 70–99)
Sodium: 133 mEq/L — ABNORMAL LOW (ref 135–145)
Total Protein: 5.8 g/dL — ABNORMAL LOW (ref 6.0–8.3)

## 2012-11-02 LAB — CBC
Hemoglobin: 14.9 g/dL (ref 13.0–17.0)
MCH: 31.6 pg (ref 26.0–34.0)
MCHC: 34.9 g/dL (ref 30.0–36.0)
RDW: 13.9 % (ref 11.5–15.5)

## 2012-11-02 LAB — BASIC METABOLIC PANEL
BUN: 12 mg/dL (ref 6–23)
CO2: 24 mEq/L (ref 19–32)
Calcium: 7.9 mg/dL — ABNORMAL LOW (ref 8.4–10.5)
GFR calc non Af Amer: >90 mL/min (ref >90)
Glucose, Bld: 106 mg/dL — ABNORMAL HIGH (ref 70–99)

## 2012-11-02 LAB — HEPARIN LEVEL (UNFRACTIONATED): Heparin Unfractionated: 0.17 IU/mL — ABNORMAL LOW (ref 0.30–0.70)

## 2012-11-02 MED ORDER — JEVITY 1.2 CAL PO LIQD
1000.0000 mL | ORAL | Status: DC
  Administered 2012-11-03: 09:00:00
  Filled 2012-11-02 (×3): qty 1000

## 2012-11-02 NOTE — Progress Notes (Signed)
ANTICOAGULATION CONSULT NOTE - Follow Up Consult  Pharmacy Consult for heparin  Indication: S/p cardiac arrest / therapeutic hypothermia / IABP   No Known Allergies  Patient Measurements: Height: 5\' 9"  (175.3 cm) Weight: 168 lb 3.4 oz (76.3 kg) IBW/kg (Calculated) : 70.7 Heparin Dosing Weight: 76 kg   Vital Signs: Temp: 99.5 F (37.5 C) (06/01 0400) Temp src: Core (Comment) (06/01 0400) BP: 123/67 mmHg (06/01 0400) Pulse Rate: 96 (05/31 2356)  Labs:  Recent Labs  10/30/12 2314  10/31/12 0622 10/31/12 0829  11/01/12 0400  11/01/12 1140 11/01/12 1400 11/01/12 1600 11/01/12 2259 11/02/12 0429  HGB  --   < > 16.4  17.0  --   --  16.2  --   --   --   --   --  14.9  HCT  --   < > 45.1  50.0  --   --  45.1  --   --   --   --   --  42.7  PLT  --   --  254  --   --  260  --   --   --   --   --  165  APTT 43*  --   --  49*  --   --   --   --   --   --   --   --   LABPROT 16.0*  --   --  14.3  --   --   --   --   --   --   --   --   INR 1.31  --   --  1.13  --   --   --   --   --   --   --   --   HEPARINUNFRC  --   --  0.10*  --   < > 0.67  --   --  0.27*  --   --  0.17*  CREATININE  --   < > 0.80  --   < > 0.71  < > 0.81  --  0.89 0.87  --   < > = values in this interval not displayed.  Estimated Creatinine Clearance: 95.9 ml/min (by C-G formula based on Cr of 0.87).  Assessment: 57 yo male on IABP s/p cardiac arrest/hypothermia protocol for heparin.  Patient has now fully rewarmed.  Goal of Therapy:  Heparin level 0.2 - 0.5 units/ml Monitor platelets by anticoagulation protocol: Yes   Plan:  Increase Heparin 1000 units/hr Check heparin level in 8 hours.  Geannie Risen, PharmD, BCPS

## 2012-11-02 NOTE — Progress Notes (Signed)
ANTICOAGULATION CONSULT NOTE - Follow Up Consult  Pharmacy Consult for heparin  Indication: S/p cardiac arrest / therapeutic hypothermia / IABP   No Known Allergies  Patient Measurements: Height: 5\' 9"  (175.3 cm) Weight: 168 lb 3.4 oz (76.3 kg) IBW/kg (Calculated) : 70.7 Heparin Dosing Weight: 76 kg   Vital Signs: Temp: 99.9 F (37.7 C) (06/01 0700) Temp src: Core (Comment) (06/01 1000) BP: 124/47 mmHg (06/01 0802) Pulse Rate: 92 (06/01 0802)  Labs:  Recent Labs  10/30/12 2314  10/31/12 0622 10/31/12 0829  11/01/12 0400  11/01/12 1400  11/01/12 2259 11/02/12 0429 11/02/12 0833 11/02/12 0948  HGB  --   < > 16.4  17.0  --   --  16.2  --   --   --   --  14.9  --   --   HCT  --   < > 45.1  50.0  --   --  45.1  --   --   --   --  42.7  --   --   PLT  --   --  254  --   --  260  --   --   --   --  165  --   --   APTT 43*  --   --  49*  --   --   --   --   --   --   --   --   --   LABPROT 16.0*  --   --  14.3  --   --   --   --   --   --   --   --   --   INR 1.31  --   --  1.13  --   --   --   --   --   --   --   --   --   HEPARINUNFRC  --   --  0.10*  --   < > 0.67  --  0.27*  --   --  0.17*  --  0.18*  CREATININE  --   < > 0.80  --   < > 0.71  < >  --   < > 0.87 0.88 0.87  --   < > = values in this interval not displayed.  Estimated Creatinine Clearance: 95.9 ml/min (by C-G formula based on Cr of 0.87).  Assessment: 57 yo male on IABP s/p cardiac arrest/hypothermia protocol for heparin.  Patient has now fully rewarmed. Heparin level subtherapeutic at 0.18 on 1000 units/hr. CBC wnl   Goal of Therapy:  Heparin level 0.2 - 0.5 units/ml Monitor platelets by anticoagulation protocol: Yes   Plan:  1. Increase Heparin to 1200 units/hr 2. 6-hr HL 3. Daily CBC, heparin level   Thank you, [employer], Pharm.D. Clinical Pharmacist   Pager: [phone] 11/02/2012 10:48 AM

## 2012-11-02 NOTE — Progress Notes (Addendum)
Pt turned, mouth suctioned. Excessive gaging and coughing. Increase in BP and HR (See flowsheet). Fentanyl drip increased and versed drip restarted. Will monitor.

## 2012-11-02 NOTE — Progress Notes (Signed)
PULMONARY  / CRITICAL CARE MEDICINE  Name: Walter Frazier MRN: [id number] DOB: 1956/01/04    ADMISSION DATE:  10/30/2012 CONSULTATION DATE:  10/30/2012  REFERRING MD :  Diona Browner PRIMARY SERVICE: PCCM  CHIEF COMPLAINT:  Cardiac arrest  BRIEF PATIENT DESCRIPTION: 57 y/o male smoker with no known past medical history was admitted to [facility] on 5/29 after cardiac arrest from a STEMI.  He was comatose afterwards and was started on the hypothermia protocol and transferred to [facility].  SIGNIFICANT EVENTS / STUDIES:  5/29 LHC > DES to prox LAD @ [facility]. IABP placed 5/29 Transfer to [facility] for hypothermia 5/30 Echo >> EF 25%, LV severely dilated  LINES / TUBES: ETT 5/29 >> IABP 5/29 >> L IJ CVL 5/29 >> L radial A-line 5/29 >>   CULTURES: MRSA PCR 5/29 >> neg  ANTIBIOTICS: None    SUBJ/Overnight: S/p rewarming.  Somewhat awake.  Remains on IABP, attempting to wean back to 1:2.  Off pressors.    PHYSICAL EXAMINATION: Gen: wdwn male, NAD, more awake  HEENT: WNL PULM:  resps even non labored on vent, diminished L  CV: RRR, balloon pump, no JVD AB: BS+, soft, nontender, no hsm Ext: cool, no edema, no clubbing, no cyanosis Derm: no rash or skin breakdown Neuro: RASS -1, opens eyes to voice, potentially following some simple commands (bllinks on command)    LABS: BMET  Recent Labs Lab 11/01/12 1140 11/01/12 1600 11/01/12 2259 11/02/12 0429 11/02/12 0833  NA 135 135 134* 133* 134*  K 3.8 3.7 4.2 4.1 4.0  CL 102 102 103 102 103  CO2 22 23 23 24 24   GLUCOSE 135* 129* 107* 105* 106*  BUN 10 10 12 12 12   CREATININE 0.81 0.89 0.87 0.88 0.87  CALCIUM 7.9* 7.8* 8.0* 8.2* 7.9*  MG  --   --  1.7  --   --   PHOS  --   --  2.2*  --   --      CBC  Recent Labs Lab 10/31/12 0622 11/01/12 0400 11/02/12 0429  HGB 16.4  17.0 16.2 14.9  HCT 45.1  50.0 45.1 42.7  WBC 21.7* 28.3* 20.4*  PLT 254 260 165    ABG    Component Value Date/Time   PHART 7.443 11/01/2012 1203    PCO2ART 31.4* 11/01/2012 1203   PO2ART 100.0 11/01/2012 1203   HCO3 21.6 11/01/2012 1203   TCO2 23 11/01/2012 1203   ACIDBASEDEF 2.0 11/01/2012 1203   O2SAT 98.0 11/01/2012 1203     Recent Labs Lab 11/01/12 1607 11/01/12 1912 11/01/12 2312 11/02/12 0414 11/02/12 0828  GLUCAP 125* 81 91 87 94    CXR:  Dg Chest Port 1 View  11/02/2012   *RADIOLOGY REPORT*  Clinical Data: Respiratory failure, shortness of breath  PORTABLE CHEST - 1 VIEW  Comparison:   the previous day's study  Findings: Low position of endotracheal tube, only 12 mm above carina.  Left IJ central line and nasogastric tube are stable in position.  Patchy left perihilar, retrocardiac, and patchy lower lung airspace opacities.  No effusion.  Heart size upper limits normal for technique.  A IABP tip is low, at the level of the left eighth rib.   IMPRESSION:  Low position of endotracheal tube and IABP as above.   Original Report Authenticated By: D. Andria Rhein, MD   Dg Chest Port 1 View  11/01/2012   *RADIOLOGY REPORT*  Clinical Data: Acute respiratory failure on ventilator.  Hiccups.  PORTABLE  CHEST - 1 VIEW  Comparison: 11/01/2012  Findings: Support apparatus remains in appropriate position.  There is persistent opacity in the left retrocardiac lung base which is unchanged.  Right lung remains clear.  No new or worsening areas of pulmonary opacity are seen.  Heart size remains within normal limits.  IMPRESSION: Stable left retrocardiac opacity.   Original Report Authenticated By: Myles Rosenthal, M.D.   Dg Chest Port 1 View  11/01/2012   *RADIOLOGY REPORT*  Clinical Data: Respiratory failure  PORTABLE CHEST - 1 VIEW  Comparison:   the previous day's study  Findings: Endotracheal tube, nasogastric tube, and left IJ central line are stable in position.  IABP tip projects at the lower margin of the aortic arch as before.  Left retrocardiac consolidation / atelectasis slightly increased.  Right lung clear.  No effusion.  IMPRESSION:  1.  Some  increase in left retrocardiac consolidation / atelectasis. 2. Support hardware stable in position.   Original Report Authenticated By: D. Andria Rhein, MD     ASSESSMENT / PLAN:  CARDIOVASCULAR A: Cardiac arrest and recurrent VTach STEMI - likely anterior MI Shock/ hypotension - resolved.   Prox LAD occlusion, s/p DES PCI, P:  - amiodarone, heparin, anti-platelet, and balloon pump per cardiology - balloon pump to remain for now , attempting wean 6/1 - lifevest at d/c and echo in 3 months post d/c per cards   PULMONARY A: Acute respiratory failure in the setting of cardiac arrest P:   -cont full vent support while on IABP -f/u ABG  -daily CXR  -retract ETT 3cm 6/1 -SBT once IABP off, more awake    NEUROLOGIC A:  Post anoxic encephalopathy P:   -cont monitor mental status  -low dose continuous sedation - per RN was biting ETT, excessive coughing with sedation completely off, cont attempts to wean as tol    RENAL A: Hyponatremia - mild  Hypophosphatemia  P:   -monitor UOP, BMET   GASTROINTESTINAL A:  No acute issues P:   -PPI -start TF 6/1   HEMATOLOGIC A:  No acute issues P:  - f/u cbc  - cont heparin    INFECTIOUS Leukocytosis - afebrile P:   - monitor off abx   ENDOCRINE A:  No acute issues P:    Code status> Full  WHITEHEART,KATHRYN, NP 11/02/2012  10:24 AM Pager: [phone] or [phone]  I have personally obtained a history, examined the patient, evaluated laboratory and imaging results, formulated the assessment and plan and placed orders.  CRITICAL CARE: The patient is critically ill with multiple organ systems failure and requires high complexity decision making for assessment and support, frequent evaluation and titration of therapies, application of advanced monitoring technologies and extensive interpretation of multiple databases. Critical Care Time devoted to patient care services described in this note is 35 minutes.   *Care  during the described time interval was provided by me and/or other providers on the critical care team. I have reviewed this patient's available data, including medical history, events of note, physical examination and test results as part of my evaluation.  Alyson Reedy, M.D. [facility] Pulmonary/Critical Care Medicine. Pager: [phone]. After hours pager: [phone].

## 2012-11-02 NOTE — Progress Notes (Addendum)
Patient ID: Walter Frazier, male   DOB: 1956/04/07, 57 y.o.   MRN: [id number]    SUBJECTIVE: Rewarmed and norepinephrine now off.  IABP remains in place, augmenting appropriately.  He has not awakened.    Marland Kitchen amiodarone  200 mg Oral BID  . antiseptic oral rinse  15 mL Mouth Rinse QID  . aspirin  81 mg Oral Daily  . atorvastatin  80 mg Per Tube q1800  . chlorhexidine  15 mL Mouth Rinse BID  . clopidogrel  75 mg Oral Q breakfast  . insulin aspart  2-6 Units Subcutaneous Q4H  . insulin glargine  10 Units Subcutaneous Q24H  . pantoprazole sodium  40 mg Per Tube QHS  amiodarone gtt @ 30 Versed gtt Fentanyl gtt Heparin gtt    Filed Vitals:   11/02/12 0400 11/02/12 0500 11/02/12 0600 11/02/12 0700  BP: 123/67 114/68 117/67   Pulse:      Temp: 99.5 F (37.5 C) 99.9 F (37.7 C) 99.9 F (37.7 C) 99.9 F (37.7 C)  TempSrc: Core (Comment) Core (Comment) Core (Comment) Core (Comment)  Resp: 20 20 20 20   Height:      Weight:      SpO2: 99% 99% 100% 97%    Intake/Output Summary (Last 24 hours) at 11/02/12 0758 Last data filed at 11/02/12 0700  Gross per 24 hour  Intake 2161.51 ml  Output   1275 ml  Net 886.51 ml    LABS: Basic Metabolic Panel:  Recent Labs  40/98/11 1600 11/01/12 2259 11/02/12 0429  NA 135 134* 133*  K 3.7 4.2 4.1  CL 102 103 102  CO2 23 23 24   GLUCOSE 129* 107* 105*  BUN 10 12 12   CREATININE 0.89 0.87 0.88  CALCIUM 7.8* 8.0* 8.2*  MG  --  1.7  --   PHOS  --  2.2*  --    Liver Function Tests:  Recent Labs  11/02/12 0429  AST 114*  ALT 153*  ALKPHOS 80  BILITOT 0.4  PROT 5.8*  ALBUMIN 2.8*   No results found for this basename: LIPASE, AMYLASE,  in the last 72 hours CBC:  Recent Labs  11/01/12 0400 11/02/12 0429  WBC 28.3* 20.4*  HGB 16.2 14.9  HCT 45.1 42.7  MCV 87.7 90.5  PLT 260 165   Cardiac Enzymes: No results found for this basename: CKTOTAL, CKMB, CKMBINDEX, TROPONINI,  in the last 72 hours BNP: No components found  with this basename: POCBNP,  D-Dimer: No results found for this basename: DDIMER,  in the last 72 hours Hemoglobin A1C:  Recent Labs  10/31/12 0035  HGBA1C 5.8*   Fasting Lipid Panel: No results found for this basename: CHOL, HDL, LDLCALC, TRIG, CHOLHDL, LDLDIRECT,  in the last 72 hours Thyroid Function Tests: No results found for this basename: TSH, T4TOTAL, FREET3, T3FREE, THYROIDAB,  in the last 72 hours Anemia Panel: No results found for this basename: VITAMINB12, FOLATE, FERRITIN, TIBC, IRON, RETICCTPCT,  in the last 72 hours  RADIOLOGY: Dg Chest Port 1 View  11/01/2012   *RADIOLOGY REPORT*  Clinical Data: Respiratory failure  PORTABLE CHEST - 1 VIEW  Comparison:   the previous day's study  Findings: Endotracheal tube, nasogastric tube, and left IJ central line are stable in position.  IABP tip projects at the lower margin of the aortic arch as before.  Left retrocardiac consolidation / atelectasis slightly increased.  Right lung clear.  No effusion.  IMPRESSION:  1.  Some increase in left retrocardiac  consolidation / atelectasis. 2. Support hardware stable in position.   Original Report Authenticated By: D. Andria Rhein, MD   Dg Chest Port 1 View  10/31/2012   *RADIOLOGY REPORT*  Clinical Data: Repositioning of the intra-aortic balloon pump.  PORTABLE CHEST - 1 VIEW  Comparison: Chest radiograph performed earlier today at 01:22 a.m.  Findings: The patient's intra-aortic balloon pump tip is seen at the aortic arch, approximately 1.5 cm above the level of the carina.  This could be retracted 1.5 cm, if deemed clinically necessary.  The left IJ line is noted ending about the distal SVC.  An external pacing pad is again noted overlying the left hemithorax.  The patient's endotracheal tube is seen ending 4 cm above the carina.  The lungs are well-aerated and appear grossly clear.  There is no evidence of focal opacification, pleural effusion or pneumothorax.  The cardiomediastinal silhouette is  within normal limits.  No acute osseous abnormalities are seen.  IMPRESSION:  1.  Intra-aortic balloon pump tip noted at the aortic arch, 1.5 cm above the level of the carina.  This could be retracted 1.5 cm, if deemed clinically necessary. 2.  No other changes from the prior study.   Original Report Authenticated By: Tonia Ghent, M.D.   Dg Chest Portable 1 View  10/31/2012   *RADIOLOGY REPORT*  Clinical Data: Moved intra-aortic balloon and endotracheal tube.  PORTABLE CHEST - 1 VIEW  Comparison: Chest radiograph performed earlier today at 12:05 a.m.  Findings: The patient's endotracheal tube is seen ending 4 cm above the carina.  The patient's left IJ line is noted ending about the distal SVC. The intra-aortic balloon pump tip is still noted overlying the aortic knob, approximately 2 cm more superior than expected.  The lungs are clear bilaterally.  No focal consolidation, pleural effusion or pneumothorax is seen.  A left-sided external pacing pad is again noted.  The patient's enteric tube is seen ending at the fundus of the stomach.  No acute osseous abnormalities are seen.  IMPRESSION:  1.  Intra-aortic balloon pump tip still seen overlying the aortic arch, above the level of the carina, approximately 2 cm more superior than expected.  Would retract this by 2 cm, as deemed clinically appropriate. 2.  Endotracheal tube noted in expected position, ending 4 cm above the carina. 3.  No acute cardiopulmonary process seen.   Original Report Authenticated By: Tonia Ghent, M.D.   Dg Chest Portable 1 View  10/31/2012   *RADIOLOGY REPORT*  Clinical Data: Central line placement.  PORTABLE CHEST - 1 VIEW  Comparison: Chest radiograph performed 10/30/2012  Findings: The patient's endotracheal tube is seen ending 6-7 cm above the carina.  A left IJ line is noted ending about the distal SVC.  An enteric tube is noted extending below the diaphragm.  The patient's intra-aortic balloon pump tip has migrated  approximately 2 cm superiorly since the prior study.  The lungs are well expanded.  Minimal left basilar density is thought to reflect normal vasculature and the overlying external pacing pad.  No pleural effusion or pneumothorax is seen.  The cardiomediastinal silhouette remains normal in size.  No acute osseous abnormalities are seen.  IMPRESSION:  1.  Left IJ line noted ending about the distal SVC. 2.  Endotracheal tube seen ending 6-7 cm above the carina.  Would advance this by 3-4 cm. 3.  Intra-aortic balloon pump tip has migrated 2 cm superiorly since the prior study. Would retract this by 2 cm. 4.  No acute cardiopulmonary process seen.  These results were called by telephone on 10/31/2012 at 12:31 a.m. to Nursing on [id number], who verbally acknowledged these results.   Original Report Authenticated By: Tonia Ghent, M.D.   Dg Chest Port 1 View  10/30/2012   *RADIOLOGY REPORT*  Clinical Data: Assess intra-aortic balloon pump.  PORTABLE CHEST - 1 VIEW  Comparison: None.  Findings: The patient's endotracheal tube is seen ending 5-6 cm above the carina.  An enteric tube is noted extending below the diaphragm.  The tip of the intra-aortic balloon pump is seen just below the aortic arch, just above the level of the carina, in appropriate position.  The lungs are relatively well expanded.  Mild vascular congestion is noted, without definite pulmonary edema.  The external pacing pad is noted overlying the left hemithorax.  No pleural effusion or pneumothorax is seen.  The cardiomediastinal silhouette is normal in size.  No acute osseous abnormalities are identified.  IMPRESSION:  1.  Tip of the intra-aortic balloon pump noted just above the level of the carina and just below the aortic arch, in appropriate position. 2.  Endotracheal tube seen ending 5-6 cm above the carina. 3.  Mild vascular congestion, without definite pulmonary edema.   Original Report Authenticated By: Tonia Ghent, M.D.    PHYSICAL  EXAM General: Intubated, sedated Neck: No JVD, no thyromegaly or thyroid nodule.  Lungs: Clear to auscultation bilaterally with normal respiratory effort. CV: Nondisplaced PMI.  Heart regular S1/S2, no S3/S4, no murmur.  No peripheral edema.   Abdomen: Soft, no hepatosplenomegaly, no distention.  Neurologic: Sedated, does not awaken Psych: Normal affect. Extremities: No clubbing or cyanosis.   TELEMETRY: Reviewed telemetry pt in NSR  ASSESSMENT AND PLAN: 57 yo had VT arrest Friday with anterior STEMI.  Had DES to pLAD, IABP placed and patient placed on hypothermia protocol.   1. CAD: Anterior STEMI s/p DES to pLAD.  Continue ASA 81, Plavix, atorvastatin 80.  2. Cardiogenic shock:  EF 25% on echo with wall motion abnormalities consistent with anterior MI.   IABP at 1:1 but now off norepinephrine.  BP stable.  Will wean IABP to 1:2 today, if tolerates for 2 hours will go to 1:3 and turn off heparin with goal to remove IABP this afternoon.  3. VT: Occurred in setting of anterior MI.  No further VT.  I am going to stop IV amiodarone and start po amiodarone for now.  Will likely not send home on amiodarone.  Will need Lifevest at discharge with echo in 3 months to determine need for ICD assuming neurologic recovery.  4. Neuro: Patient has not yet awakened after rewarming.  Continue to follow.  Sedation wean this morning.   Marca Ancona 11/02/2012 7:58 AM

## 2012-11-02 NOTE — Progress Notes (Signed)
Pt weaned off IABP with no complications. Heparin turned off at 1335, ACT at 1445= 154. IABP pulled at 1503. 30 minutes of manual pressure held with no complications. Right groin with pressure dressing at a level 0. Will continue to monitor.

## 2012-11-03 ENCOUNTER — Inpatient Hospital Stay (HOSPITAL_COMMUNITY): Payer: BC Managed Care – PPO

## 2012-11-03 DIAGNOSIS — G9340 Encephalopathy, unspecified: Secondary | ICD-10-CM

## 2012-11-03 DIAGNOSIS — G40901 Epilepsy, unspecified, not intractable, with status epilepticus: Secondary | ICD-10-CM

## 2012-11-03 HISTORY — DX: Epilepsy, unspecified, not intractable, with status epilepticus: G40.901

## 2012-11-03 LAB — BLOOD GAS, ARTERIAL
Acid-base deficit: 0.6 mmol/L (ref 0.0–2.0)
Bicarbonate: 23.2 mEq/L (ref 20.0–24.0)
MECHVT: 500 mL
O2 Saturation: 99.3 %
Patient temperature: 98.6
TCO2: 24.3 mmol/L (ref 0–100)

## 2012-11-03 LAB — CBC
Hemoglobin: 13.3 g/dL (ref 13.0–17.0)
MCH: 31.7 pg (ref 26.0–34.0)
MCV: 91.9 fL (ref 78.0–100.0)
Platelets: 135 10*3/uL — ABNORMAL LOW (ref 150–400)
RBC: 4.2 MIL/uL — ABNORMAL LOW (ref 4.22–5.81)
WBC: 18.6 10*3/uL — ABNORMAL HIGH (ref 4.0–10.5)

## 2012-11-03 LAB — BASIC METABOLIC PANEL
CO2: 24 mEq/L (ref 19–32)
Calcium: 8.4 mg/dL (ref 8.4–10.5)
Chloride: 98 mEq/L (ref 96–112)
Creatinine, Ser: 0.8 mg/dL (ref 0.50–1.35)
Glucose, Bld: 102 mg/dL — ABNORMAL HIGH (ref 70–99)
Sodium: 131 mEq/L — ABNORMAL LOW (ref 135–145)

## 2012-11-03 LAB — HEPARIN LEVEL (UNFRACTIONATED): Heparin Unfractionated: 0.13 IU/mL — ABNORMAL LOW (ref 0.30–0.70)

## 2012-11-03 LAB — GLUCOSE, CAPILLARY
Glucose-Capillary: 75 mg/dL (ref 70–99)
Glucose-Capillary: 129 mg/dL — ABNORMAL HIGH (ref 70–99)

## 2012-11-03 LAB — PHOSPHORUS: Phosphorus: 2 mg/dL — ABNORMAL LOW (ref 2.3–4.6)

## 2012-11-03 LAB — MAGNESIUM: Magnesium: 2.4 mg/dL (ref 1.5–2.5)

## 2012-11-03 MED ORDER — SODIUM CHLORIDE 0.9 % IV SOLN
INTRAVENOUS | Status: DC
  Administered 2012-11-03: 08:00:00 via INTRAVENOUS

## 2012-11-03 MED ORDER — PHENYTOIN 125 MG/5ML PO SUSP
100.0000 mg | Freq: Three times a day (TID) | ORAL | Status: DC
  Administered 2012-11-03 – 2012-11-04 (×3): 100 mg
  Filled 2012-11-03 (×4): qty 4

## 2012-11-03 MED ORDER — LORAZEPAM 2 MG/ML IJ SOLN
2.0000 mg | Freq: Once | INTRAMUSCULAR | Status: AC
  Administered 2012-11-03: 2 mg via INTRAVENOUS

## 2012-11-03 MED ORDER — METOPROLOL TARTRATE 25 MG PO TABS: 25.0000 mg | ORAL_TABLET | Freq: Two times a day (BID) | ORAL | Status: DC

## 2012-11-03 MED ORDER — INSULIN ASPART 100 UNIT/ML ~~LOC~~ SOLN
0.0000 [IU] | SUBCUTANEOUS | Status: DC
  Administered 2012-11-03 (×2): 1 [IU] via SUBCUTANEOUS
  Administered 2012-11-04 (×4): 2 [IU] via SUBCUTANEOUS
  Administered 2012-11-04 (×2): 1 [IU] via SUBCUTANEOUS
  Administered 2012-11-05: 2 [IU] via SUBCUTANEOUS
  Administered 2012-11-05: 1 [IU] via SUBCUTANEOUS
  Administered 2012-11-05 (×2): 2 [IU] via SUBCUTANEOUS
  Administered 2012-11-05: 1 [IU] via SUBCUTANEOUS
  Administered 2012-11-06: 2 [IU] via SUBCUTANEOUS
  Administered 2012-11-06 (×3): 1 [IU] via SUBCUTANEOUS
  Administered 2012-11-06 – 2012-11-07 (×3): 2 [IU] via SUBCUTANEOUS
  Administered 2012-11-07 – 2012-11-08 (×2): 1 [IU] via SUBCUTANEOUS
  Administered 2012-11-08: 2 [IU] via SUBCUTANEOUS
  Administered 2012-11-08 – 2012-11-09 (×2): 1 [IU] via SUBCUTANEOUS
  Administered 2012-11-09: 2 [IU] via SUBCUTANEOUS
  Administered 2012-11-09 (×4): 1 [IU] via SUBCUTANEOUS
  Administered 2012-11-10: 2 [IU] via SUBCUTANEOUS
  Administered 2012-11-10: 1 [IU] via SUBCUTANEOUS

## 2012-11-03 MED ORDER — ASPIRIN 81 MG PO CHEW
81.0000 mg | CHEWABLE_TABLET | Freq: Every day | ORAL | Status: DC
  Administered 2012-11-04 – 2012-11-09 (×6): 81 mg
  Filled 2012-11-03 (×6): qty 1

## 2012-11-03 MED ORDER — DEXMEDETOMIDINE HCL IN NACL 200 MCG/50ML IV SOLN
0.2000 ug/kg/h | INTRAVENOUS | Status: DC
  Administered 2012-11-03: 0.2 ug/kg/h via INTRAVENOUS
  Filled 2012-11-03: qty 50

## 2012-11-03 MED ORDER — FOSPHENYTOIN SODIUM 500 MG PE/10ML IJ SOLN
1500.0000 mg | Freq: Once | INTRAMUSCULAR | Status: AC
  Administered 2012-11-03: 1500 mg via INTRAVENOUS
  Filled 2012-11-03: qty 30

## 2012-11-03 MED ORDER — FENTANYL CITRATE 0.05 MG/ML IJ SOLN
25.0000 ug | INTRAMUSCULAR | Status: DC | PRN
  Administered 2012-11-04 – 2012-11-05 (×12): 100 ug via INTRAVENOUS
  Filled 2012-11-03 (×11): qty 2

## 2012-11-03 MED ORDER — HYDRALAZINE HCL 20 MG/ML IJ SOLN
10.0000 mg | Freq: Four times a day (QID) | INTRAMUSCULAR | Status: DC | PRN
  Administered 2012-11-05: 10 mg via INTRAVENOUS
  Filled 2012-11-03: qty 1

## 2012-11-03 MED ORDER — ENOXAPARIN SODIUM 40 MG/0.4ML ~~LOC~~ SOLN
40.0000 mg | Freq: Every day | SUBCUTANEOUS | Status: DC
  Administered 2012-11-03 – 2012-11-16 (×13): 40 mg via SUBCUTANEOUS
  Filled 2012-11-03 (×16): qty 0.4

## 2012-11-03 MED ORDER — ENOXAPARIN SODIUM 30 MG/0.3ML ~~LOC~~ SOLN: 30.0000 mg | SUBCUTANEOUS | Status: DC

## 2012-11-03 MED ORDER — METOPROLOL TARTRATE 25 MG PO TABS
25.0000 mg | ORAL_TABLET | Freq: Four times a day (QID) | ORAL | Status: DC
  Administered 2012-11-03 – 2012-11-05 (×9): 25 mg via ORAL
  Filled 2012-11-03 (×14): qty 1

## 2012-11-03 MED ORDER — AMIODARONE HCL 200 MG PO TABS
200.0000 mg | ORAL_TABLET | Freq: Two times a day (BID) | ORAL | Status: DC
  Administered 2012-11-03 – 2012-11-04 (×2): 200 mg
  Filled 2012-11-03 (×3): qty 1

## 2012-11-03 MED ORDER — CLOPIDOGREL BISULFATE 75 MG PO TABS
75.0000 mg | ORAL_TABLET | Freq: Every day | ORAL | Status: DC
  Administered 2012-11-04 – 2012-11-10 (×7): 75 mg
  Filled 2012-11-03 (×10): qty 1

## 2012-11-03 MED ORDER — LORAZEPAM 2 MG/ML IJ SOLN
INTRAMUSCULAR | Status: AC
  Filled 2012-11-03: qty 1

## 2012-11-03 MED ORDER — JEVITY 1.2 CAL PO LIQD
1000.0000 mL | ORAL | Status: DC
  Administered 2012-11-03: 1000 mL
  Administered 2012-11-04 – 2012-11-05 (×2)
  Administered 2012-11-06: 55 mL/h
  Filled 2012-11-03 (×6): qty 1000

## 2012-11-03 MED ORDER — PRO-STAT SUGAR FREE PO LIQD
30.0000 mL | Freq: Three times a day (TID) | ORAL | Status: DC
  Administered 2012-11-03 – 2012-11-06 (×9): 30 mL
  Filled 2012-11-03 (×11): qty 30

## 2012-11-03 NOTE — Progress Notes (Addendum)
Subjective: Patient intubated, not sedated.  Minimal responses Objective: Filed Vitals:   11/03/12 0355 11/03/12 0400 11/03/12 0500 11/03/12 0600  BP: 142/70 123/71 117/65 113/64  Pulse:      Temp: 97.3 F (36.3 C) 97.5 F (36.4 C) 98.4 F (36.9 C) 98.6 F (37 C)  TempSrc:  Core (Comment) Core (Comment) Core (Comment)  Resp: 20 20 20 20   Height:      Weight:      SpO2: 100% 100% 100% 100%   Weight change:   Intake/Output Summary (Last 24 hours) at 11/03/12 0739 Last data filed at 11/03/12 0600  Gross per 24 hour  Intake 1224.2 ml  Output   1770 ml  Net -545.8 ml    General: Patient intubated Neck:  JVP is normal Heart: Regular rate and rhythm, without murmurs, rubs, gallops.  Lungs: Clear to auscultation.  No rales or wheezes. Exemities:  No edema.  1+PT Neuro: Retracts to sternal rub.  + Babinski  Tele:  SR   Lab Results: Results for orders placed during the hospital encounter of 10/30/12 (from the past 24 hour(s))  GLUCOSE, CAPILLARY     Status: None   Collection Time    11/02/12  8:28 AM      Result Value Range   Glucose-Capillary 94  70 - 99 mg/dL  BASIC METABOLIC PANEL     Status: Abnormal   Collection Time    11/02/12  8:33 AM      Result Value Range   Sodium 134 (*) 135 - 145 mEq/L   Potassium 4.0  3.5 - 5.1 mEq/L   Chloride 103  96 - 112 mEq/L   CO2 24  19 - 32 mEq/L   Glucose, Bld 106 (*) 70 - 99 mg/dL   BUN 12  6 - 23 mg/dL   Creatinine, Ser 4.09  0.50 - 1.35 mg/dL   Calcium 7.9 (*) 8.4 - 10.5 mg/dL   GFR calc non Af Amer >90  >90 mL/min   GFR calc Af Amer >90  >90 mL/min  HEPARIN LEVEL (UNFRACTIONATED)     Status: Abnormal   Collection Time    11/02/12  9:48 AM      Result Value Range   Heparin Unfractionated 0.18 (*) 0.30 - 0.70 IU/mL  GLUCOSE, CAPILLARY     Status: None   Collection Time    11/02/12 11:29 AM      Result Value Range   Glucose-Capillary 96  70 - 99 mg/dL  POCT ACTIVATED CLOTTING TIME     Status: None   Collection Time   11/02/12  2:51 PM      Result Value Range   Activated Clotting Time 154    GLUCOSE, CAPILLARY     Status: Abnormal   Collection Time    11/02/12  4:18 PM      Result Value Range   Glucose-Capillary 101 (*) 70 - 99 mg/dL  GLUCOSE, CAPILLARY     Status: None   Collection Time    11/02/12  8:36 PM      Result Value Range   Glucose-Capillary 97  70 - 99 mg/dL  GLUCOSE, CAPILLARY     Status: None   Collection Time    11/02/12 11:40 PM      Result Value Range   Glucose-Capillary 91  70 - 99 mg/dL  HEPARIN LEVEL (UNFRACTIONATED)     Status: Abnormal   Collection Time    11/03/12 12:02 AM      Result Value  Range   Heparin Unfractionated 0.20 (*) 0.30 - 0.70 IU/mL  GLUCOSE, CAPILLARY     Status: None   Collection Time    11/03/12  4:25 AM      Result Value Range   Glucose-Capillary 97  70 - 99 mg/dL  CBC     Status: Abnormal   Collection Time    11/03/12  4:31 AM      Result Value Range   WBC 18.6 (*) 4.0 - 10.5 K/uL   RBC 4.20 (*) 4.22 - 5.81 MIL/uL   Hemoglobin 13.3  13.0 - 17.0 g/dL   HCT 40.9 (*) 81.1 - 91.4 %   MCV 91.9  78.0 - 100.0 fL   MCH 31.7  26.0 - 34.0 pg   MCHC 34.5  30.0 - 36.0 g/dL   RDW 78.2  95.6 - 21.3 %   Platelets 135 (*) 150 - 400 K/uL  BASIC METABOLIC PANEL     Status: Abnormal   Collection Time    11/03/12  4:31 AM      Result Value Range   Sodium 131 (*) 135 - 145 mEq/L   Potassium 3.7  3.5 - 5.1 mEq/L   Chloride 98  96 - 112 mEq/L   CO2 24  19 - 32 mEq/L   Glucose, Bld 102 (*) 70 - 99 mg/dL   BUN 11  6 - 23 mg/dL   Creatinine, Ser 0.86  0.50 - 1.35 mg/dL   Calcium 8.4  8.4 - 57.8 mg/dL   GFR calc non Af Amer >90  >90 mL/min   GFR calc Af Amer >90  >90 mL/min  PHOSPHORUS     Status: Abnormal   Collection Time    11/03/12  4:31 AM      Result Value Range   Phosphorus 2.0 (*) 2.3 - 4.6 mg/dL  MAGNESIUM     Status: None   Collection Time    11/03/12  4:31 AM      Result Value Range   Magnesium 2.4  1.5 - 2.5 mg/dL  HEPARIN LEVEL  (UNFRACTIONATED)     Status: Abnormal   Collection Time    11/03/12  5:00 AM      Result Value Range   Heparin Unfractionated 0.13 (*) 0.30 - 0.70 IU/mL  BLOOD GAS, ARTERIAL     Status: Abnormal   Collection Time    11/03/12  6:45 AM      Result Value Range   FIO2 0.40     Delivery systems VENTILATOR     Mode PRESSURE REGULATED VOLUME CONTROL     VT 500     Rate 20     Peep/cpap 5.0     pH, Arterial 7.425  7.350 - 7.450   pCO2 arterial 36.0  35.0 - 45.0 mmHg   pO2, Arterial 183.0 (*) 80.0 - 100.0 mmHg   Bicarbonate 23.2  20.0 - 24.0 mEq/L   TCO2 24.3  0 - 100 mmol/L   Acid-base deficit 0.6  0.0 - 2.0 mmol/L   O2 Saturation 99.3     Patient temperature 98.6     Collection site A-LINE     Drawn by DRAWN BY RN     Sample type ARTERIAL DRAW     Allens test (pass/fail) PASS  PASS    Studies/Results: @RISRSLT24 @  Medications: Reviewed   @PROBHOSP @  1.  STEMI  Patient s/p PTCA/stent to LAD  IABP discontinued this weekend.  Echo shows LVEF is 25%.  Remains on ASA  and PLavix   CVP is 2. Will start lopressor 25 qid.   D/C heparin.  Hydrate with NS  Watch I/Os  2.  VT  No signif arrhythmia over 24 hours.  COntinue amiodarone PO  3.  HL  On lipitor  4.  Neuro.  Patient with minimal neurologic response.  WIll ask neuro for full evaluation.  Clinical status is critical.  Dietrich Pates   LOS: 4 days   Dietrich Pates 11/03/2012, 7:39 AM

## 2012-11-03 NOTE — Progress Notes (Signed)
PULMONARY  / CRITICAL CARE MEDICINE  Name: Walter Frazier MRN: [id number] DOB: [birth year]    ADMISSION DATE:  10/30/2012 CONSULTATION DATE:  10/30/2012  REFERRING MD :  Diona Browner PRIMARY SERVICE: PCCM  CHIEF COMPLAINT:  Cardiac arrest  BRIEF PATIENT DESCRIPTION: 57 y/o male smoker with no known past medical history was admitted to [facility] on 5/29 after cardiac arrest from a STEMI.  He was comatose afterwards and was started on the hypothermia protocol and transferred to [facility].  SIGNIFICANT EVENTS / STUDIES:  5/29 LHC > DES to prox LAD @ [facility]. IABP placed 5/29 Transfer to [facility] for hypothermia 5/30 Echo >> EF 25%, LV severely dilated 6/02 RN noted ?myoclonus - sedation infusions resumed. Purposeful on WUA but not F/C. Neuro consult   LINES / TUBES: IABP 5/29 >> 6/01 ETT 5/29 >> L IJ CVL 5/29 >> L radial A-line 5/29 >>   CULTURES: MRSA PCR 5/29 >> neg  ANTIBIOTICS: None    SUBJ/Overnight:  Purposeful on WUA but not F/C   PHYSICAL EXAMINATION: Gen: wdwn male, NAD, more awake  HEENT: WNL PULM:  resps even non labored on vent, diminished L  CV: RRR, balloon pump, no JVD AB: BS+, soft, nontender, no hsm Ext: no C/C/E Neuro: RASS -1, opens eyes to voice, purposeful, MAEs   LABS: BMET  Recent Labs Lab 11/01/12 1600 11/01/12 2259 11/02/12 0429 11/02/12 0833 11/03/12 0431  NA 135 134* 133* 134* 131*  K 3.7 4.2 4.1 4.0 3.7  CL 102 103 102 103 98  CO2 23 23 24 24 24   GLUCOSE 129* 107* 105* 106* 102*  BUN 10 12 12 12 11   CREATININE 0.89 0.87 0.88 0.87 0.80  CALCIUM 7.8* 8.0* 8.2* 7.9* 8.4  MG  --  1.7  --   --  2.4  PHOS  --  2.2*  --   --  2.0*     CBC  Recent Labs Lab 11/01/12 0400 11/02/12 0429 11/03/12 0431  HGB 16.2 14.9 13.3  HCT 45.1 42.7 38.6*  WBC 28.3* 20.4* 18.6*  PLT 260 165 135*    ABG    Component Value Date/Time   PHART 7.425 11/03/2012 0645   PCO2ART 36.0 11/03/2012 0645   PO2ART 183.0* 11/03/2012 0645   HCO3 23.2 11/03/2012 0645   TCO2  24.3 11/03/2012 0645   ACIDBASEDEF 0.6 11/03/2012 0645   O2SAT 99.3 11/03/2012 0645     Recent Labs Lab 11/02/12 2036 11/02/12 2340 11/03/12 0425 11/03/12 0748 11/03/12 1243  GLUCAP 97 91 97 75 88    CXR: NACPD    ASSESSMENT / PLAN:  CARDIOVASCULAR A: Cardiac arrest due to recurrent VT STEMI - AWMI Prox LAD occlusion, s/p DES PCI @ [facility] Cardiogenic shock - resolved.    P:  - mgmt per Cards  PULMONARY A: Acute respiratory failure in the setting of cardiac arrest P:   -Cont full vent - changes made -Daily WUA/SBT as indicated   NEUROLOGIC A:  Post anoxic encephalopathy P:   -Minimize sedation -Neuro consult 6/02   RENAL A: Hyponatremia - mild  Hypophosphatemia  P:   -monitor UOP, BMET -Correct electrolytes as indicated   GASTROINTESTINAL A:  No acute issues P:   -Cont PPI -Cont TFs   HEMATOLOGIC A:  No acute issues P:  - f/u cbc  - cont heparin    INFECTIOUS Leukocytosis - afebrile P:   - monitor off abx   ENDOCRINE A:  No acute issues P:    Code status> Full  Family updated @ bedside   35 mins CCM   Billy Fischer, MD ; [facility] [phone].  After 5:30 PM or weekends, call [phone]

## 2012-11-03 NOTE — Progress Notes (Signed)
ANTICOAGULATION CONSULT NOTE   Pharmacy Consult for heparin  Indication: S/p cardiac arrest / therapeutic hypothermia  No Known Allergies  Patient Measurements: Height: 5\' 9"  (175.3 cm) Weight: 168 lb 3.4 oz (76.3 kg) IBW/kg (Calculated) : 70.7 Heparin Dosing Weight: 76 kg   Vital Signs: Temp: 98.4 F (36.9 C) (06/02 0000) Temp src: Core (Comment) (06/02 0000) BP: 144/80 mmHg (06/02 0000) Pulse Rate: 82 (06/01 1635)  Labs:  Recent Labs  10/31/12 0622 10/31/12 0829  11/01/12 0400  11/01/12 2259 11/02/12 0429 11/02/12 0833 11/02/12 0948 11/03/12 0002  HGB 16.4  17.0  --   --  16.2  --   --  14.9  --   --   --   HCT 45.1  50.0  --   --  45.1  --   --  42.7  --   --   --   PLT 254  --   --  260  --   --  165  --   --   --   APTT  --  49*  --   --   --   --   --   --   --   --   LABPROT  --  14.3  --   --   --   --   --   --   --   --   INR  --  1.13  --   --   --   --   --   --   --   --   HEPARINUNFRC 0.10*  --   < > 0.67  < >  --  0.17*  --  0.18* 0.20*  CREATININE 0.80  --   < > 0.71  < > 0.87 0.88 0.87  --   --   < > = values in this interval not displayed.  Estimated Creatinine Clearance: 95.9 ml/min (by C-G formula based on Cr of 0.87).  Assessment: 57 yo male s/p cardiac arrest/hypothermia protocol, IABP removed, for heparin.   Goal of Therapy:  Heparin level 0.2 - 0.5 units/ml Monitor platelets by anticoagulation protocol: Yes   Plan:  Continue Heparin at current rate for now F/U plan for anticoagulation in am  Geannie Risen, PharmD, BCPS

## 2012-11-03 NOTE — Consult Note (Signed)
NEURO HOSPITALIST CONSULT NOTE    Reason for Consult:  HPI:                                                                                                                                          Walter Frazier is an 57 y.o. male smoker with no known past medical history was admitted to [facility] on 5/29 after cardiac arrest from a STEMI. He was comatose afterwards and was started on the hypothermia protocol and transferred to [facility]. Patient was rewarmed at 02:30 on 11/01/2012. Post rewarming patient has been noted to have some posturing motions and continues to not breath over the ventilator. All sedation has been turned of as of 7:30 AM 11/03/2012. Currently patient has a WBC of 18.6 Neurology was called for prognostication.    PMH: none  Family history not attainable due to mental status   Social History: + for smoking  No Known Allergies  MEDICATIONS:                                                                                                                     Prior to Admission:  No prescriptions prior to admission   Scheduled: . amiodarone  200 mg Oral BID  . antiseptic oral rinse  15 mL Mouth Rinse QID  . aspirin  81 mg Oral Daily  . atorvastatin  80 mg Per Tube q1800  . chlorhexidine  15 mL Mouth Rinse BID  . clopidogrel  75 mg Oral Q breakfast  . enoxaparin (LOVENOX) injection  40 mg Subcutaneous Daily  . feeding supplement  30 mL Per Tube TID  . insulin aspart  2-6 Units Subcutaneous Q4H  . insulin glargine  10 Units Subcutaneous Q24H  . metoprolol tartrate  25 mg Oral QID  . pantoprazole sodium  40 mg Per Tube QHS     ROS:  History obtained from unobtainable from patient due to mental status    Blood pressure 175/69, pulse 97, temperature 98.1 F (36.7 C), temperature source Core (Comment), resp. rate 18,  height 5\' 9"  (1.753 m), weight 76.3 kg (168 lb 3.4 oz), SpO2 100.00%.   Neurologic Examination:                                                                                                      Mental Status: Patient does not respond to verbal stimuli.  Opens eyes and slightly turns head to deep sternal rub.  Does not follow commands.  No verbalizations noted.  Cranial Nerves: II: patient does not respond confrontation bilaterally, pupils right 2 mm, left 2 mm,and reactive bilaterally III,IV,VI: doll's response intact bilaterally.  V,VII: corneal reflex present bilaterally  VIII: patient does not respond to verbal stimuli IX,X: gag reflex present, XI: trapezius strength unable to test bilaterally XII: tongue strength unable to test Motor: Extremities flaccid throughout.  No withdraw to pain bilateral UE but does withdrawal to pain bilateral LE.   Sensory: Does respond to noxious stimuli in LE Deep Tendon Reflexes:  Brisk 2+ throughout. Plantars: upgoing bilaterally Cerebellar: Unable to perform    No results found for this basename: cbc, bmp, coags, chol, tri, ldl, hga1c    Results for orders placed during the hospital encounter of 10/30/12 (from the past 48 hour(s))  GLUCOSE, CAPILLARY     Status: Abnormal   Collection Time    11/01/12 11:25 AM      Result Value Range   Glucose-Capillary 110 (*) 70 - 99 mg/dL  BASIC METABOLIC PANEL     Status: Abnormal   Collection Time    11/01/12 11:40 AM      Result Value Range   Sodium 135  135 - 145 mEq/L   Potassium 3.8  3.5 - 5.1 mEq/L   Chloride 102  96 - 112 mEq/L   CO2 22  19 - 32 mEq/L   Glucose, Bld 135 (*) 70 - 99 mg/dL   BUN 10  6 - 23 mg/dL   Creatinine, Ser 1.61  0.50 - 1.35 mg/dL   Calcium 7.9 (*) 8.4 - 10.5 mg/dL   GFR calc non Af Amer >90  >90 mL/min   GFR calc Af Amer >90  >90 mL/min   Comment:            The eGFR has been calculated     using the CKD EPI equation.     This calculation has not been      validated in all clinical     situations.     eGFR's persistently     <90 mL/min signify     possible Chronic Kidney Disease.  POCT I-STAT 3, BLOOD GAS (G3+)     Status: Abnormal   Collection Time    11/01/12 12:03 PM      Result Value Range   pH, Arterial 7.443  7.350 - 7.450   pCO2 arterial 31.4 (*) 35.0 - 45.0 mmHg   pO2, Arterial 100.0  80.0 -  100.0 mmHg   Bicarbonate 21.6  20.0 - 24.0 mEq/L   TCO2 23  0 - 100 mmol/L   O2 Saturation 98.0     Acid-base deficit 2.0  0.0 - 2.0 mmol/L   Patient temperature 36.2 C     Collection site ARTERIAL LINE     Drawn by RT     Sample type ARTERIAL    HEPARIN LEVEL (UNFRACTIONATED)     Status: Abnormal   Collection Time    11/01/12  2:00 PM      Result Value Range   Heparin Unfractionated 0.27 (*) 0.30 - 0.70 IU/mL   Comment:            IF HEPARIN RESULTS ARE BELOW     EXPECTED VALUES, AND PATIENT     DOSAGE HAS BEEN CONFIRMED,     SUGGEST FOLLOW UP TESTING     OF ANTITHROMBIN III LEVELS.  BASIC METABOLIC PANEL     Status: Abnormal   Collection Time    11/01/12  4:00 PM      Result Value Range   Sodium 135  135 - 145 mEq/L   Potassium 3.7  3.5 - 5.1 mEq/L   Chloride 102  96 - 112 mEq/L   CO2 23  19 - 32 mEq/L   Glucose, Bld 129 (*) 70 - 99 mg/dL   BUN 10  6 - 23 mg/dL   Creatinine, Ser 1.61  0.50 - 1.35 mg/dL   Calcium 7.8 (*) 8.4 - 10.5 mg/dL   GFR calc non Af Amer >90  >90 mL/min   GFR calc Af Amer >90  >90 mL/min   Comment:            The eGFR has been calculated     using the CKD EPI equation.     This calculation has not been     validated in all clinical     situations.     eGFR's persistently     <90 mL/min signify     possible Chronic Kidney Disease.  GLUCOSE, CAPILLARY     Status: Abnormal   Collection Time    11/01/12  4:07 PM      Result Value Range   Glucose-Capillary 125 (*) 70 - 99 mg/dL  GLUCOSE, CAPILLARY     Status: None   Collection Time    11/01/12  7:12 PM      Result Value Range    Glucose-Capillary 81  70 - 99 mg/dL  BASIC METABOLIC PANEL     Status: Abnormal   Collection Time    11/01/12 10:59 PM      Result Value Range   Sodium 134 (*) 135 - 145 mEq/L   Potassium 4.2  3.5 - 5.1 mEq/L   Chloride 103  96 - 112 mEq/L   CO2 23  19 - 32 mEq/L   Glucose, Bld 107 (*) 70 - 99 mg/dL   BUN 12  6 - 23 mg/dL   Creatinine, Ser 0.96  0.50 - 1.35 mg/dL   Calcium 8.0 (*) 8.4 - 10.5 mg/dL   GFR calc non Af Amer >90  >90 mL/min   GFR calc Af Amer >90  >90 mL/min   Comment:            The eGFR has been calculated     using the CKD EPI equation.     This calculation has not been     validated in all clinical     situations.  eGFR's persistently     <90 mL/min signify     possible Chronic Kidney Disease.  PHOSPHORUS     Status: Abnormal   Collection Time    11/01/12 10:59 PM      Result Value Range   Phosphorus 2.2 (*) 2.3 - 4.6 mg/dL  MAGNESIUM     Status: None   Collection Time    11/01/12 10:59 PM      Result Value Range   Magnesium 1.7  1.5 - 2.5 mg/dL  CALCIUM, IONIZED     Status: None   Collection Time    11/01/12 11:03 PM      Result Value Range   Calcium, Ion 1.14  1.12 - 1.23 mmol/L  GLUCOSE, CAPILLARY     Status: None   Collection Time    11/01/12 11:12 PM      Result Value Range   Glucose-Capillary 91  70 - 99 mg/dL  GLUCOSE, CAPILLARY     Status: None   Collection Time    11/02/12  4:14 AM      Result Value Range   Glucose-Capillary 87  70 - 99 mg/dL  CBC     Status: Abnormal   Collection Time    11/02/12  4:29 AM      Result Value Range   WBC 20.4 (*) 4.0 - 10.5 K/uL   RBC 4.72  4.22 - 5.81 MIL/uL   Hemoglobin 14.9  13.0 - 17.0 g/dL   HCT 16.1  09.6 - 04.5 %   MCV 90.5  78.0 - 100.0 fL   MCH 31.6  26.0 - 34.0 pg   MCHC 34.9  30.0 - 36.0 g/dL   RDW 40.9  81.1 - 91.4 %   Platelets 165  150 - 400 K/uL   Comment: REPEATED TO VERIFY     SPECIMEN CHECKED FOR CLOTS  HEPARIN LEVEL (UNFRACTIONATED)     Status: Abnormal   Collection Time     11/02/12  4:29 AM      Result Value Range   Heparin Unfractionated 0.17 (*) 0.30 - 0.70 IU/mL   Comment:            IF HEPARIN RESULTS ARE BELOW     EXPECTED VALUES, AND PATIENT     DOSAGE HAS BEEN CONFIRMED,     SUGGEST FOLLOW UP TESTING     OF ANTITHROMBIN III LEVELS.  COMPREHENSIVE METABOLIC PANEL     Status: Abnormal   Collection Time    11/02/12  4:29 AM      Result Value Range   Sodium 133 (*) 135 - 145 mEq/L   Potassium 4.1  3.5 - 5.1 mEq/L   Chloride 102  96 - 112 mEq/L   CO2 24  19 - 32 mEq/L   Glucose, Bld 105 (*) 70 - 99 mg/dL   BUN 12  6 - 23 mg/dL   Creatinine, Ser 7.82  0.50 - 1.35 mg/dL   Calcium 8.2 (*) 8.4 - 10.5 mg/dL   Total Protein 5.8 (*) 6.0 - 8.3 g/dL   Albumin 2.8 (*) 3.5 - 5.2 g/dL   AST 956 (*) 0 - 37 U/L   ALT 153 (*) 0 - 53 U/L   Alkaline Phosphatase 80  39 - 117 U/L   Total Bilirubin 0.4  0.3 - 1.2 mg/dL   GFR calc non Af Amer >90  >90 mL/min   GFR calc Af Amer >90  >90 mL/min   Comment:  The eGFR has been calculated     using the CKD EPI equation.     This calculation has not been     validated in all clinical     situations.     eGFR's persistently     <90 mL/min signify     possible Chronic Kidney Disease.  GLUCOSE, CAPILLARY     Status: None   Collection Time    11/02/12  8:28 AM      Result Value Range   Glucose-Capillary 94  70 - 99 mg/dL  BASIC METABOLIC PANEL     Status: Abnormal   Collection Time    11/02/12  8:33 AM      Result Value Range   Sodium 134 (*) 135 - 145 mEq/L   Potassium 4.0  3.5 - 5.1 mEq/L   Chloride 103  96 - 112 mEq/L   CO2 24  19 - 32 mEq/L   Glucose, Bld 106 (*) 70 - 99 mg/dL   BUN 12  6 - 23 mg/dL   Creatinine, Ser 1.61  0.50 - 1.35 mg/dL   Calcium 7.9 (*) 8.4 - 10.5 mg/dL   GFR calc non Af Amer >90  >90 mL/min   GFR calc Af Amer >90  >90 mL/min   Comment:            The eGFR has been calculated     using the CKD EPI equation.     This calculation has not been     validated in all  clinical     situations.     eGFR's persistently     <90 mL/min signify     possible Chronic Kidney Disease.  HEPARIN LEVEL (UNFRACTIONATED)     Status: Abnormal   Collection Time    11/02/12  9:48 AM      Result Value Range   Heparin Unfractionated 0.18 (*) 0.30 - 0.70 IU/mL   Comment:            IF HEPARIN RESULTS ARE BELOW     EXPECTED VALUES, AND PATIENT     DOSAGE HAS BEEN CONFIRMED,     SUGGEST FOLLOW UP TESTING     OF ANTITHROMBIN III LEVELS.  GLUCOSE, CAPILLARY     Status: None   Collection Time    11/02/12 11:29 AM      Result Value Range   Glucose-Capillary 96  70 - 99 mg/dL  POCT ACTIVATED CLOTTING TIME     Status: None   Collection Time    11/02/12  2:51 PM      Result Value Range   Activated Clotting Time 154    GLUCOSE, CAPILLARY     Status: Abnormal   Collection Time    11/02/12  4:18 PM      Result Value Range   Glucose-Capillary 101 (*) 70 - 99 mg/dL  GLUCOSE, CAPILLARY     Status: None   Collection Time    11/02/12  8:36 PM      Result Value Range   Glucose-Capillary 97  70 - 99 mg/dL  GLUCOSE, CAPILLARY     Status: None   Collection Time    11/02/12 11:40 PM      Result Value Range   Glucose-Capillary 91  70 - 99 mg/dL  HEPARIN LEVEL (UNFRACTIONATED)     Status: Abnormal   Collection Time    11/03/12 12:02 AM      Result Value Range   Heparin Unfractionated 0.20 (*) 0.30 -  0.70 IU/mL   Comment:            IF HEPARIN RESULTS ARE BELOW     EXPECTED VALUES, AND PATIENT     DOSAGE HAS BEEN CONFIRMED,     SUGGEST FOLLOW UP TESTING     OF ANTITHROMBIN III LEVELS.  GLUCOSE, CAPILLARY     Status: None   Collection Time    11/03/12  4:25 AM      Result Value Range   Glucose-Capillary 97  70 - 99 mg/dL  CBC     Status: Abnormal   Collection Time    11/03/12  4:31 AM      Result Value Range   WBC 18.6 (*) 4.0 - 10.5 K/uL   RBC 4.20 (*) 4.22 - 5.81 MIL/uL   Hemoglobin 13.3  13.0 - 17.0 g/dL   HCT 16.1 (*) 09.6 - 04.5 %   MCV 91.9  78.0 - 100.0  fL   MCH 31.7  26.0 - 34.0 pg   MCHC 34.5  30.0 - 36.0 g/dL   RDW 40.9  81.1 - 91.4 %   Platelets 135 (*) 150 - 400 K/uL  BASIC METABOLIC PANEL     Status: Abnormal   Collection Time    11/03/12  4:31 AM      Result Value Range   Sodium 131 (*) 135 - 145 mEq/L   Potassium 3.7  3.5 - 5.1 mEq/L   Chloride 98  96 - 112 mEq/L   CO2 24  19 - 32 mEq/L   Glucose, Bld 102 (*) 70 - 99 mg/dL   BUN 11  6 - 23 mg/dL   Creatinine, Ser 7.82  0.50 - 1.35 mg/dL   Calcium 8.4  8.4 - 95.6 mg/dL   GFR calc non Af Amer >90  >90 mL/min   GFR calc Af Amer >90  >90 mL/min   Comment:            The eGFR has been calculated     using the CKD EPI equation.     This calculation has not been     validated in all clinical     situations.     eGFR's persistently     <90 mL/min signify     possible Chronic Kidney Disease.  PHOSPHORUS     Status: Abnormal   Collection Time    11/03/12  4:31 AM      Result Value Range   Phosphorus 2.0 (*) 2.3 - 4.6 mg/dL  MAGNESIUM     Status: None   Collection Time    11/03/12  4:31 AM      Result Value Range   Magnesium 2.4  1.5 - 2.5 mg/dL  HEPARIN LEVEL (UNFRACTIONATED)     Status: Abnormal   Collection Time    11/03/12  5:00 AM      Result Value Range   Heparin Unfractionated 0.13 (*) 0.30 - 0.70 IU/mL   Comment:            IF HEPARIN RESULTS ARE BELOW     EXPECTED VALUES, AND PATIENT     DOSAGE HAS BEEN CONFIRMED,     SUGGEST FOLLOW UP TESTING     OF ANTITHROMBIN III LEVELS.  BLOOD GAS, ARTERIAL     Status: Abnormal   Collection Time    11/03/12  6:45 AM      Result Value Range   FIO2 0.40     Delivery systems VENTILATOR  Mode PRESSURE REGULATED VOLUME CONTROL     VT 500     Rate 20     Peep/cpap 5.0     pH, Arterial 7.425  7.350 - 7.450   pCO2 arterial 36.0  35.0 - 45.0 mmHg   pO2, Arterial 183.0 (*) 80.0 - 100.0 mmHg   Bicarbonate 23.2  20.0 - 24.0 mEq/L   TCO2 24.3  0 - 100 mmol/L   Acid-base deficit 0.6  0.0 - 2.0 mmol/L   O2 Saturation  99.3     Patient temperature 98.6     Collection site A-LINE     Drawn by DRAWN BY RN     Sample type ARTERIAL DRAW     Allens test (pass/fail) PASS  PASS  GLUCOSE, CAPILLARY     Status: None   Collection Time    11/03/12  7:48 AM      Result Value Range   Glucose-Capillary 75  70 - 99 mg/dL    Dg Chest Port 1 View  11/03/2012   *RADIOLOGY REPORT*  Clinical Data: Respiratory failure  PORTABLE CHEST - 1 VIEW  Comparison: Chest radiograph 11/02/2012  Findings: Endotracheal tube is 4.2 cm from carina in good position. Aortic balloon pump has been removed.  NG tube extends in the stomach.  Left central venous line is unchanged.  Stable cardiac silhouette with left basilar atelectasis.  There is some fine air space disease in the left lower lobe.   Right lung is clear.  IMPRESSION:  1.  Endotracheal tube in good position. 2.  Fine air space disease in the left lower lobe could represent edema, pneumonia, or pneumonitis.   Original Report Authenticated By: Genevive Bi, M.D.   Dg Chest Port 1 View  11/02/2012   *RADIOLOGY REPORT*  Clinical Data: Respiratory failure, shortness of breath  PORTABLE CHEST - 1 VIEW  Comparison:   the previous day's study  Findings: Low position of endotracheal tube, only 12 mm above carina.  Left IJ central line and nasogastric tube are stable in position.  Patchy left perihilar, retrocardiac, and patchy lower lung airspace opacities.  No effusion.  Heart size upper limits normal for technique.  A IABP tip is low, at the level of the left eighth rib.   IMPRESSION:  Low position of endotracheal tube and IABP as above.   Original Report Authenticated By: D. Andria Rhein, MD   Dg Chest Port 1 View  11/01/2012   *RADIOLOGY REPORT*  Clinical Data: Acute respiratory failure on ventilator.  Hiccups.  PORTABLE CHEST - 1 VIEW  Comparison: 11/01/2012  Findings: Support apparatus remains in appropriate position.  There is persistent opacity in the left retrocardiac lung base which is  unchanged.  Right lung remains clear.  No new or worsening areas of pulmonary opacity are seen.  Heart size remains within normal limits.  IMPRESSION: Stable left retrocardiac opacity.   Original Report Authenticated By: Myles Rosenthal, M.D.    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 His EEG did show status epilepticus, but with the rapid resolution with Ativan treatment, I feel that he still has a chance of a good outcome. It is a chance that he will have significant injury, and this was discussed with family but I do not feel that at this time we can say that he  has a poor prognosis with any degree of certainty.  1) fosphenytoin load 20 mg per kilogram 2) Dilantin 100 mg 3 times a day per tube 3) continue his EEG was started.  This patient is critically ill and at significant risk of neurological worsening, death and care requires constant monitoring of vital signs, hemodynamics,respiratory and cardiac monitoring, neurological assessment, discussion with family, other specialists and medical decision making of high complexity. I spent 40 minutes of neurocritical care time  in the care of  this patient.  Ritta Slot, MD [facility] [phone]  If 7pm- 7am, please page neurology on call at [phone].  11/03/2012  4:45 PM

## 2012-11-03 NOTE — Progress Notes (Signed)
Patient had centrally predominant discharges consistent with non-convulsive status epilepticus. He had resolution of these discharges after ativan administration. He is being loaded with fosphenytoin.   Ritta Slot, MD [facility] [phone]  If 7pm- 7am, please page neurology on call at [phone].

## 2012-11-03 NOTE — Progress Notes (Addendum)
Pt stiff with arms extended to sides, legs straight and stiff with feet plantar flexed, back and neck arched backward. Eyes fixed upwards. Excessive gaging on ETT.BP elevated. No stimulus prior to activity. Versed drip restarted at 2mg /hr. Dr. Darrick Penna made aware. Will continue to monitor.

## 2012-11-03 NOTE — Progress Notes (Signed)
LTVM EEG initiated. 

## 2012-11-03 NOTE — Progress Notes (Addendum)
45mg  versed fentanyl  WIS

## 2012-11-03 NOTE — Progress Notes (Signed)
Decreased fio2 to 30 due to stable sats 

## 2012-11-03 NOTE — Progress Notes (Signed)
Assumed care for this 7a-7p shift. Pt intubated and sedated. Sedation held for WUA. Assessment and vitals as documented. Dr Tenny Craw rounding on pt at 0740, orders received. Monitoring closely.

## 2012-11-03 NOTE — Progress Notes (Signed)
NUTRITION FOLLOW UP  Intervention:   1. Continue Jevity 1.2, increased by 10 ml q 4 hr to a goal rate of 55 ml/hr.  2. Add 30 ml Pro-stat via tube TID. This EN regimen will provide 1884 kcal, 118 gm protein, and 1065 ml free water.   Nutrition Dx:   nadequate oral intake related to inability to eat as evidenced by NPO diet.   Goal:   Meet >/=90% estimated nutrition need. unmet  Monitor:   Vent status, weight trends, labs, I/O's   Assessment:   Pt remains intubated, no post hypothermia protocol. RD consulted for initiation and management of enteral nutrition. Has OG tube in place, tip extends into the stomach.  Pt currently with Jevity 1.2 at 30 ml/hr, this is providing 864 kcal, 40 gm protein, and 581 ml free water daily.   Height: Ht Readings from Last 1 Encounters:  10/30/12 5\' 9"  (1.753 m)    Weight Status:   Wt Readings from Last 1 Encounters:  10/30/12 168 lb 3.4 oz (76.3 kg)   Patient is currently intubated on ventilator support.  MV: 10.2 Temp:Temp (24hrs), Avg:98.4 F (36.9 C), Min:97.3 F (36.3 C), Max:99.5 F (37.5 C)  Propofol: none   Re-estimated needs:  Kcal: 1890 Protein: >/= 114 gm  Fluid: >/= 1.5 L   Skin: intact   Diet Order: NPO   Intake/Output Summary (Last 24 hours) at 11/03/12 0953 Last data filed at 11/03/12 0800  Gross per 24 hour  Intake 1224.1 ml  Output   1905 ml  Net -680.9 ml    Last BM: none documented this admission.    Labs:   Recent Labs Lab 11/01/12 1600 11/01/12 2259 11/02/12 0429 11/02/12 0833 11/03/12 0431  NA 135 134* 133* 134* 131*  K 3.7 4.2 4.1 4.0 3.7  CL 102 103 102 103 98  CO2 23 23 24 24 24   BUN 10 12 12 12 11   CREATININE 0.89 0.87 0.88 0.87 0.80  CALCIUM 7.8* 8.0* 8.2* 7.9* 8.4  MG  --  1.7  --   --  2.4  PHOS  --  2.2*  --   --  2.0*  GLUCOSE 129* 107* 105* 106* 102*    CBG (last 3)   Recent Labs  11/02/12 2340 11/03/12 0425 11/03/12 0748  GLUCAP 91 97 75    Scheduled Meds: .  amiodarone  200 mg Oral BID  . antiseptic oral rinse  15 mL Mouth Rinse QID  . aspirin  81 mg Oral Daily  . atorvastatin  80 mg Per Tube q1800  . chlorhexidine  15 mL Mouth Rinse BID  . clopidogrel  75 mg Oral Q breakfast  . enoxaparin (LOVENOX) injection  40 mg Subcutaneous Daily  . insulin aspart  2-6 Units Subcutaneous Q4H  . insulin glargine  10 Units Subcutaneous Q24H  . metoprolol tartrate  25 mg Oral QID  . pantoprazole sodium  40 mg Per Tube QHS    Continuous Infusions: . sodium chloride 10 mL/hr at 11/03/12 0800  . sodium chloride Stopped (11/03/12 0758)  . sodium chloride 70 mL/hr at 11/03/12 0800  . feeding supplement (JEVITY 1.2 CAL) 20 mL/hr at 11/03/12 0917  . fentaNYL infusion INTRAVENOUS Stopped (11/03/12 0730)  . midazolam (VERSED) infusion Stopped (11/03/12 0730)  . norepinephrine (LEVOPHED) Adult infusion 15 mcg/min (11/01/12 1500)    Clarene Duke RD, LDN Pager [phone] After Hours pager [phone]

## 2012-11-03 NOTE — Progress Notes (Signed)
Versed drip off. Will monitor.

## 2012-11-04 ENCOUNTER — Inpatient Hospital Stay (HOSPITAL_COMMUNITY): Payer: BC Managed Care – PPO

## 2012-11-04 DIAGNOSIS — G40401 Other generalized epilepsy and epileptic syndromes, not intractable, with status epilepticus: Secondary | ICD-10-CM

## 2012-11-04 LAB — BASIC METABOLIC PANEL
BUN: 13 mg/dL (ref 6–23)
CO2: 25 mEq/L (ref 19–32)
Calcium: 8.9 mg/dL (ref 8.4–10.5)
GFR calc non Af Amer: >90 mL/min (ref >90)
Glucose, Bld: 147 mg/dL — ABNORMAL HIGH (ref 70–99)
Potassium: 3.8 mEq/L (ref 3.5–5.1)

## 2012-11-04 LAB — GLUCOSE, CAPILLARY
Glucose-Capillary: 132 mg/dL — ABNORMAL HIGH (ref 70–99)
Glucose-Capillary: 153 mg/dL — ABNORMAL HIGH (ref 70–99)
Glucose-Capillary: 159 mg/dL — ABNORMAL HIGH (ref 70–99)
Glucose-Capillary: 165 mg/dL — ABNORMAL HIGH (ref 70–99)

## 2012-11-04 LAB — CBC
Hemoglobin: 13.1 g/dL (ref 13.0–17.0)
MCH: 31.3 pg (ref 26.0–34.0)
MCHC: 34.8 g/dL (ref 30.0–36.0)
MCV: 89.7 fL (ref 78.0–100.0)

## 2012-11-04 LAB — POCT ACTIVATED CLOTTING TIME: Activated Clotting Time: 160 s

## 2012-11-04 MED ORDER — PHENYTOIN 125 MG/5ML PO SUSP
125.0000 mg | Freq: Three times a day (TID) | ORAL | Status: DC
  Administered 2012-11-04 – 2012-11-09 (×15): 125 mg
  Filled 2012-11-04 (×30): qty 5

## 2012-11-04 MED ORDER — LORAZEPAM 2 MG/ML IJ SOLN
INTRAMUSCULAR | Status: AC
  Administered 2012-11-04: 2 mg via INTRAVENOUS
  Filled 2012-11-04: qty 1

## 2012-11-04 MED ORDER — LORAZEPAM 2 MG/ML IJ SOLN: 2.0000 mg | Freq: Once | INTRAMUSCULAR | Status: AC

## 2012-11-04 MED ORDER — MIDAZOLAM HCL 2 MG/2ML IJ SOLN
2.0000 mg | INTRAMUSCULAR | Status: DC | PRN
  Administered 2012-11-04 (×6): 2 mg via INTRAVENOUS
  Administered 2012-11-05 (×4): 4 mg via INTRAVENOUS
  Filled 2012-11-04 (×3): qty 4
  Filled 2012-11-04: qty 2
  Filled 2012-11-04: qty 4
  Filled 2012-11-04 (×5): qty 2

## 2012-11-04 NOTE — Procedures (Signed)
History:  57 year old male post cardiac arrest with persistent encephalopathy  This continuous EEG recorded from 06/02 2:52pm until 06/03 11:05 am  Sedation: Ativan was given during this EEG  Background: The background starts with a 1.5-2.5 Hz frontocentrally predominant sharp and slow wave discharge superimposed on background of generalized disorganized slow activity. This activity improved markedly following repeated(6mg ) ativan administration with resolution by 3:30 PM. The rest of the EEG is predominantly delta activity, though during arousals there is some alpha and theta as well. There are structures resembling sleep spindles with normal distribution. There are occasional sharp waves discharges with a central predominance.  Photic stimulation: Physiologic driving is not performed  EEG Abnormalities: 1) rhythmic sharp and slow wave discharges with a central predominance with evolution in frequency and morphology 2) generalized irregular slow activity  Clinical Interpretation: This abnormal EEG is consistent with nonconvulsive status epilepticus at the onset of the recording. Following treatment with Ativan, this pattern resolves and is replaced with a pattern of moderate encephalopathy.   Ritta Slot, MD [facility] [phone]  If 7pm- 7am, please page neurology on call at [phone].

## 2012-11-04 NOTE — Progress Notes (Signed)
Subjective: Spot review of EEG shows no seizures. Full read to follow. Had episode concerning for seizure earlier when disconnected from EEG.   Exam: Filed Vitals:   11/04/12 1800  BP: 131/74  Pulse:   Temp: 100.6 F (38.1 C)  Resp: 25   Gen: In bed, NAD MS: opens eyes to voice, does not follow commands JY:[id number], doll's eye + Motor: withdraws in legs briskly, minimally withdraws arms.  Sensory:as above [id number] upgiong toes   Impression: 57 yo M with  nonconvulsive status epilepticus following cardiac arrest. His exam at this time does suggest that he has had some hypoxic injury, but feel that he does have a chance of recovery. His nonconvulsive status seems to have broken very easily with benzodiazepine and he is being continued on dilantin. I feel that the episode today was likely seizure, and repeat EEG did not show NCSE, but will leave connected to capture further spells and guide therapy.   Recommendations: 1) Increase dilantin to 125mg  TID 2) Dilantin level tomorrow.  3) continue EEG  Ritta Slot, MD [facility] [phone]  If 7pm- 7am, please page neurology on call at [phone].

## 2012-11-04 NOTE — Progress Notes (Signed)
Patient: Walter Frazier Date of Encounter: 11/04/2012, 7:23 AM Admit date: 10/30/2012     Subjective  Mr. Brosch remains intubated. Off sedation since yesterday.  Hx of  ischemic cardiomyopathy and cardiac arrest 2/2 LAD occlusion; EF 25% IABP support  [facility] protocol cx by status epilepticus Rx ativan    Objective  Physical Exam: Vitals: BP 119/63  Pulse 89  Temp(Src) 100.8 F (38.2 C) (Core (Comment))  Resp 20  Ht 5\' 9"  (1.753 m)  Wt 168 lb 3.4 oz (76.3 kg)  BMI 24.83 kg/m2  SpO2 97% General: Ill appearing 57 year old male intubated. Neck: Supple. Difficult to assess JVD. Lungs: Clear bilaterally to auscultation without wheezes, rales, or rhonchi.  Heart: RRR S1 S2 without murmur, rub or gallop.  Abdomen: Soft, non-distended. Extremities: No clubbing or cyanosis. Mild edema.  Distal pedal pulses are 2+ and equal bilaterally. Neuro: Does not respond to verbal stimuli. +gag reflex with suction.   Intake/Output:  Intake/Output Summary (Last 24 hours) at 11/04/12 0723 Last data filed at 11/04/12 0600  Gross per 24 hour  Intake 1208.99 ml  Output   1530 ml  Net -321.01 ml    Inpatient Medications:  . amiodarone  200 mg Per Tube BID  . antiseptic oral rinse  15 mL Mouth Rinse QID  . aspirin  81 mg Per Tube Daily  . atorvastatin  80 mg Per Tube q1800  . chlorhexidine  15 mL Mouth Rinse BID  . clopidogrel  75 mg Per Tube Q breakfast  . enoxaparin (LOVENOX) injection  40 mg Subcutaneous Daily  . feeding supplement  30 mL Per Tube TID  . insulin aspart  0-9 Units Subcutaneous Q4H  . metoprolol tartrate  25 mg Oral QID  . pantoprazole sodium  40 mg Per Tube QHS  . phenytoin  100 mg Per Tube TID   . sodium chloride 20 mL/hr at 11/03/12 1300  . sodium chloride Stopped (11/03/12 0758)  . dexmedetomidine Stopped (11/03/12 1430)  . feeding supplement (JEVITY 1.2 CAL) 1,000 mL (11/03/12 1700)    Labs:  Recent Labs  11/01/12 2259  11/03/12 0431  11/04/12 0500  NA 134*  < > 131* 134*  K 4.2  < > 3.7 3.8  CL 103  < > 98 99  CO2 23  < > 24 25  GLUCOSE 107*  < > 102* 147*  BUN 12  < > 11 13  CREATININE 0.87  < > 0.80 0.73  CALCIUM 8.0*  < > 8.4 8.9  MG 1.7  --  2.4  --   PHOS 2.2*  --  2.0*  --   < > = values in this interval not displayed.  Recent Labs  11/02/12 0429  AST 114*  ALT 153*  ALKPHOS 80  BILITOT 0.4  PROT 5.8*  ALBUMIN 2.8*    Recent Labs  11/03/12 0431 11/04/12 0500  WBC 18.6* 17.7*  HGB 13.3 13.1  HCT 38.6* 37.6*  MCV 91.9 89.7  PLT 135* 162    Radiology/Studies: Ct Head Wo Contrast  11/03/2012   *RADIOLOGY REPORT*  Clinical Data: Coma. Possible anoxic brain injury. Cardiac arrest.  CT HEAD WITHOUT CONTRAST  Technique:  Contiguous axial images were obtained from the base of the skull through the vertex without contrast.  Comparison: None.  Findings: There is no evidence for acute infarction, intracranial hemorrhage, mass lesion, hydrocephalus, or extra-axial fluid. There is no atrophy or white matter disease. Gray-white junction is preserved  throughout.  Calvarium is intact.  Moderate mucosal thickening in the right greater than left maxillary sinuses and bilateral ethmoid sinuses.  Negative appearing orbits.  No mastoid fluid.  IMPRESSION: No acute intracranial findings.  Definitive CT signs of anoxic injury are not observed at this time.  Continued surveillance is warranted.   Original Report Authenticated By: Davonna Belling, M.D.   Dg Chest Port 1 View  11/03/2012   *RADIOLOGY REPORT*  Clinical Data: Respiratory failure  PORTABLE CHEST - 1 VIEW  Comparison: Chest radiograph 11/02/2012  Findings: Endotracheal tube is 4.2 cm from carina in good position. Aortic balloon pump has been removed.  NG tube extends in the stomach.  Left central venous line is unchanged.  Stable cardiac silhouette with left basilar atelectasis.  There is some fine air space disease in the left lower lobe.   Right lung is clear.   IMPRESSION:  1.  Endotracheal tube in good position. 2.  Fine air space disease in the left lower lobe could represent edema, pneumonia, or pneumonitis.   Original Report Authenticated By: Genevive Bi, M.D.   Dg Chest Port 1 View  11/02/2012   *RADIOLOGY REPORT*  Clinical Data: Respiratory failure, shortness of breath  PORTABLE CHEST - 1 VIEW  Comparison:   the previous day's study  Findings: Low position of endotracheal tube, only 12 mm above carina.  Left IJ central line and nasogastric tube are stable in position.  Patchy left perihilar, retrocardiac, and patchy lower lung airspace opacities.  No effusion.  Heart size upper limits normal for technique.  A IABP tip is low, at the level of the left eighth rib.   IMPRESSION:  Low position of endotracheal tube and IABP as above.   Original Report Authenticated By: D. Andria Rhein, MD    Telemetry: sinus tachycardia    Assessment and Plan  1. CAD with anterior STEMI: s/p DES to pLAD and IABP. Echo shows LVEF is 25%. Continue Lopressor, ASA, Plavix and atorvastatin.  2. Cardiogenic shock: IABP discontinued 11/01/2012. 3. VT: In setting of STEMI. No further VT in last 48 hours. Continue amiodarone.Will need Lifevest at discharge with echo in 3 months to determine need for ICD assuming neurologic recovery.    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 Per Neuro.  Dr. Ladona Ridgel to see Signed, EDMISTEN, [place]  Spoke with family  Continue supportive care  neurto to meet with the family later today Would stop amio follow

## 2012-11-04 NOTE — Progress Notes (Signed)
Per instruction of Dr Sung Amabile, left ring finger gold wedding band removed using the GEM ring cutting system. Unable to remove in any other fashion. Discussed reason with wife and son, both of whom are in agreement in method of removal. Ring removed and given to wife.

## 2012-11-04 NOTE — Progress Notes (Signed)
The patient waking up intermittently and fighting the vent, biting the tube;   We will initiate intermittent Versed. Consider propofol drip if intermittent versed not helping;

## 2012-11-04 NOTE — Progress Notes (Signed)
PULMONARY  / CRITICAL CARE MEDICINE  Name: Walter Frazier MRN: [id number] DOB: 1956/01/07    ADMISSION DATE:  10/30/2012 CONSULTATION DATE:  10/30/2012  REFERRING MD :  Diona Browner PRIMARY SERVICE: PCCM  CHIEF COMPLAINT:  Cardiac arrest  BRIEF PATIENT DESCRIPTION: 57 y/o male smoker with no known past medical history was admitted to [facility] on 5/29 after cardiac arrest from a STEMI.  He was comatose afterwards and was started on the hypothermia protocol and transferred to [facility].  SIGNIFICANT EVENTS / STUDIES:  5/29 LHC > DES to prox LAD @ [facility]. IABP placed 5/29 Transfer to [facility] for hypothermia 5/30 Echo >> EF 25%, LV severely dilated 6/02 RN noted ?myoclonus - sedation infusions resumed. Purposeful on WUA but not F/C.  6/02 Neuro consult: EEG > non-convulsive status epilepticus. Resolution of these discharges after ativan. Loaded with fosphenytoin.  6/02 CT head: NAD 6/03 24 hr EEG:    LINES / TUBES: IABP 5/29 >> 6/01 ETT 5/29 >> L IJ CVL 5/29 >> L radial A-line 5/29 >>   CULTURES: MRSA PCR 5/29 >> neg  ANTIBIOTICS: None    SUBJ/Overnight:  Purposeful on WUA but not F/C   PHYSICAL EXAMINATION: Gen: Comatose, NAD  HEENT: WNL PULM:  Clear anteriorly  CV: RRR s M AB: BS+, soft, nontender, no hsm Ext: no C/C/E Neuro: RASS -3, tonic contractures of B feet. No convulsive movements. No spont mvmt noted   LABS: BMET  Recent Labs Lab 11/01/12 1600 11/01/12 2259 11/02/12 0429 11/02/12 0833 11/03/12 0431 11/04/12 0500  NA 135 134* 133* 134* 131* 134*  K 3.7 4.2 4.1 4.0 3.7 3.8  CL 102 103 102 103 98 99  CO2 23 23 24 24 24 25   GLUCOSE 129* 107* 105* 106* 102* 147*  BUN 10 12 12 12 11 13   CREATININE 0.89 0.87 0.88 0.87 0.80 0.73  CALCIUM 7.8* 8.0* 8.2* 7.9* 8.4 8.9  MG  --  1.7  --   --  2.4  --   PHOS  --  2.2*  --   --  2.0*  --      CBC  Recent Labs Lab 11/02/12 0429 11/03/12 0431 11/04/12 0500  HGB 14.9 13.3 13.1  HCT 42.7 38.6* 37.6*  WBC 20.4*  18.6* 17.7*  PLT 165 135* 162    ABG    Component Value Date/Time   PHART 7.425 11/03/2012 0645   PCO2ART 36.0 11/03/2012 0645   PO2ART 183.0* 11/03/2012 0645   HCO3 23.2 11/03/2012 0645   TCO2 24.3 11/03/2012 0645   ACIDBASEDEF 0.6 11/03/2012 0645   O2SAT 99.3 11/03/2012 0645     Recent Labs Lab 11/03/12 1942 11/04/12 0030 11/04/12 0415 11/04/12 0722 11/04/12 1131  GLUCAP 121* 132* 159* 153* 165*    CXR: No new film    ASSESSMENT / PLAN:  CARDIOVASCULAR A: Cardiac arrest due to recurrent VT STEMI - AWMI Prox LAD occlusion, s/p DES PCI @ [facility] Cardiogenic shock - resolved.    P:  - mgmt per Cards  PULMONARY A: Acute respiratory failure in the setting of cardiac arrest P:   -Cont full support. No wean or extubation until cognition improves   NEUROLOGIC A:  Post anoxic encephalopathy Status epilepticus P:   -Anticonvulsants per Neuro   RENAL A: Hyponatremia - mild  Hypophosphatemia  P:   -Correct electrolytes as indicated   GASTROINTESTINAL A:  No acute issues P:   -Cont PPI -Cont TFs   HEMATOLOGIC A:  No acute issues P:  -  f/u cbc  - cont heparin    INFECTIOUS Leukocytosis - afebrile P:   - monitor off abx   ENDOCRINE A:  No acute issues P:    Code status> Full   35 mins CCM   Billy Fischer, MD ; [facility] [phone].  After 5:30 PM or weekends, call [phone]

## 2012-11-04 NOTE — Progress Notes (Signed)
While assessing patient, nurse noticed stiffening and jerking of legs. Dr. Bard Herbert who was standing outside room door was called in to confirm same assessment. Dr Bard Herbert ordered 2mg  of ativan for possible tonic seizure. Notified Neurology at 1200.

## 2012-11-04 NOTE — Progress Notes (Signed)
LTMV EEG d/ced.

## 2012-11-04 NOTE — Progress Notes (Signed)
Pt biting down on tube, BP and HR elevated. Dr Frederico Hamman notified. Awaiting orders.

## 2012-11-04 NOTE — Progress Notes (Signed)
LTVM restarted per Dr Amada Jupiter.

## 2012-11-05 ENCOUNTER — Inpatient Hospital Stay (HOSPITAL_COMMUNITY): Payer: BC Managed Care – PPO

## 2012-11-05 LAB — GLUCOSE, CAPILLARY
Glucose-Capillary: 160 mg/dL — ABNORMAL HIGH (ref 70–99)
Glucose-Capillary: 128 mg/dL — ABNORMAL HIGH (ref 70–99)
Glucose-Capillary: 152 mg/dL — ABNORMAL HIGH (ref 70–99)

## 2012-11-05 LAB — BASIC METABOLIC PANEL
BUN: 16 mg/dL (ref 6–23)
CO2: 24 mEq/L (ref 19–32)
Calcium: 8.5 mg/dL (ref 8.4–10.5)
Glucose, Bld: 157 mg/dL — ABNORMAL HIGH (ref 70–99)
Sodium: 137 mEq/L (ref 135–145)

## 2012-11-05 LAB — CBC
Hemoglobin: 13.1 g/dL (ref 13.0–17.0)
MCH: 32.1 pg (ref 26.0–34.0)
MCV: 90 fL (ref 78.0–100.0)
Platelets: 169 10*3/uL (ref 150–400)
RBC: 4.08 MIL/uL — ABNORMAL LOW (ref 4.22–5.81)

## 2012-11-05 MED ORDER — SODIUM CHLORIDE 0.9 % IV SOLN
25.0000 ug/h | INTRAVENOUS | Status: DC
  Administered 2012-11-05: 50 ug/h via INTRAVENOUS
  Administered 2012-11-07 – 2012-11-08 (×2): 100 ug/h via INTRAVENOUS
  Administered 2012-11-09: 50 ug/h via INTRAVENOUS
  Administered 2012-11-10: 100 ug/h via INTRAVENOUS
  Filled 2012-11-05 (×4): qty 50

## 2012-11-05 MED ORDER — SODIUM CHLORIDE 0.9 % IV SOLN
1.0000 mg/h | INTRAVENOUS | Status: DC
  Administered 2012-11-05: 6 mg/h via INTRAVENOUS
  Administered 2012-11-05: 8 mg/h via INTRAVENOUS
  Administered 2012-11-05: 2 mg/h via INTRAVENOUS
  Administered 2012-11-06: 6 mg/h via INTRAVENOUS
  Filled 2012-11-05 (×4): qty 10

## 2012-11-05 MED ORDER — PROPOFOL 10 MG/ML IV EMUL
INTRAVENOUS | Status: AC
  Filled 2012-11-05: qty 100

## 2012-11-05 MED ORDER — METOPROLOL TARTRATE 25 MG/10 ML ORAL SUSPENSION
37.5000 mg | Freq: Four times a day (QID) | ORAL | Status: DC
  Administered 2012-11-05 – 2012-11-09 (×19): 37.5 mg
  Filled 2012-11-05 (×23): qty 15

## 2012-11-05 MED ORDER — METOPROLOL TARTRATE 25 MG PO TABS: 37.5000 mg | ORAL_TABLET | Freq: Four times a day (QID) | ORAL | Status: DC

## 2012-11-05 MED ORDER — FENTANYL BOLUS VIA INFUSION
25.0000 ug | Freq: Four times a day (QID) | INTRAVENOUS | Status: DC | PRN
  Filled 2012-11-05: qty 100

## 2012-11-05 MED ORDER — MIDAZOLAM BOLUS VIA INFUSION
1.0000 mg | INTRAVENOUS | Status: DC | PRN
  Administered 2012-11-05 (×2): 2 mg via INTRAVENOUS
  Filled 2012-11-05: qty 2

## 2012-11-05 MED ORDER — PROPOFOL 10 MG/ML IV EMUL
5.0000 ug/kg/min | INTRAVENOUS | Status: DC
  Administered 2012-11-05: 15 ug/kg/min via INTRAVENOUS
  Administered 2012-11-05: 15.072 ug/kg/min via INTRAVENOUS
  Administered 2012-11-06: 20 ug/kg/min via INTRAVENOUS
  Administered 2012-11-06: 15 ug/kg/min via INTRAVENOUS
  Administered 2012-11-07: 25 ug/kg/min via INTRAVENOUS
  Administered 2012-11-07 – 2012-11-09 (×5): 20 ug/kg/min via INTRAVENOUS
  Administered 2012-11-09 – 2012-11-10 (×2): 30 ug/kg/min via INTRAVENOUS
  Filled 2012-11-05 (×12): qty 100

## 2012-11-05 MED ORDER — POTASSIUM CHLORIDE 10 MEQ/50ML IV SOLN
10.0000 meq | INTRAVENOUS | Status: AC
  Administered 2012-11-05 (×4): 10 meq via INTRAVENOUS
  Filled 2012-11-05 (×4): qty 50

## 2012-11-05 MED ORDER — SODIUM CHLORIDE 0.9 % IV SOLN
500.0000 mg | Freq: Two times a day (BID) | INTRAVENOUS | Status: DC
  Administered 2012-11-05 – 2012-11-09 (×9): 500 mg via INTRAVENOUS
  Filled 2012-11-05 (×10): qty 5

## 2012-11-05 MED ORDER — ACETAMINOPHEN 160 MG/5ML PO SOLN
650.0000 mg | Freq: Four times a day (QID) | ORAL | Status: DC | PRN
  Administered 2012-11-05 – 2012-11-09 (×4): 650 mg via ORAL
  Filled 2012-11-05 (×4): qty 20.3

## 2012-11-05 NOTE — Progress Notes (Signed)
PCCM Interval note  Called and notified that pt agitated, myoclonic, hypertensive tachypneic on intermittent sedation. Neurology has already done an exam this am - their opinion on his status is pending. Will convert his prn sedation to continuous versed + fentanyl.   Levy Pupa, MD, PhD 11/05/2012, 8:42 AM [facility] [phone] or if no answer [phone]

## 2012-11-05 NOTE — Progress Notes (Signed)
Hr 120s BP 220s/70s, RR 45, asynchronous with vent. Dr Delton Coombes on unit and notified. Orders received.

## 2012-11-05 NOTE — Progress Notes (Signed)
Patient: GEORGIOS KINA Date of Encounter: 11/05/2012, 7:38 AM Admit date: 10/30/2012     Subjective  Mr. Wey remains intubated and unresponsive. Off sedation since 11/03/2012. Continuous EEG in progress.   Objective  Physical Exam: Vitals: BP 164/78  Pulse 103  Temp(Src) 100.8 F (38.2 C) (Core (Comment))  Resp 31  Ht 5\' 9"  (1.753 m)  Wt 168 lb 3.4 oz (76.3 kg)  BMI 24.83 kg/m2  SpO2 94% General: Ill appearing 57 year old male intubated. Neck: Supple. Difficult to assess JVD. Lungs: Clear bilaterally to auscultation without wheezes, rales, or rhonchi.  Heart: RRR S1 S2 without murmur, rub or gallop.  Abdomen: Soft, non-distended. Extremities: No clubbing or cyanosis. Mild edema.  Distal pedal pulses are 2+ and equal bilaterally. Neuro: Does not respond to verbal stimuli. +gag reflex with suction.   Intake/Output:  Intake/Output Summary (Last 24 hours) at 11/05/12 0738 Last data filed at 11/05/12 0700  Gross per 24 hour  Intake 1953.5 ml  Output   1345 ml  Net  608.5 ml    Inpatient Medications:  . antiseptic oral rinse  15 mL Mouth Rinse QID  . aspirin  81 mg Per Tube Daily  . atorvastatin  80 mg Per Tube q1800  . chlorhexidine  15 mL Mouth Rinse BID  . clopidogrel  75 mg Per Tube Q breakfast  . enoxaparin (LOVENOX) injection  40 mg Subcutaneous Daily  . feeding supplement  30 mL Per Tube TID  . insulin aspart  0-9 Units Subcutaneous Q4H  . metoprolol tartrate  25 mg Oral QID  . pantoprazole sodium  40 mg Per Tube QHS  . phenytoin  125 mg Per Tube TID  . potassium chloride  10 mEq Intravenous Q1 Hr x 4   . sodium chloride 10 mL/hr at 11/04/12 1009  . sodium chloride 20 mL/hr at 11/04/12 1009  . feeding supplement (JEVITY 1.2 CAL) 55 mL/hr at 11/04/12 0830    Labs:  Recent Labs  11/03/12 0431 11/04/12 0500 11/05/12 0500  NA 131* 134* 137  K 3.7 3.8 3.2*  CL 98 99 105  CO2 24 25 24   GLUCOSE 102* 147* 157*  BUN 11 13 16   CREATININE 0.80  0.73 0.63  CALCIUM 8.4 8.9 8.5  MG 2.4  --   --   PHOS 2.0*  --   --     Recent Labs  11/04/12 0500 11/05/12 0500  WBC 17.7* 14.8*  HGB 13.1 13.1  HCT 37.6* 36.7*  MCV 89.7 90.0  PLT 162 169    Radiology/Studies: Ct Head Wo Contrast  11/03/2012   *RADIOLOGY REPORT*  Clinical Data: Coma. Possible anoxic brain injury. Cardiac arrest.  CT HEAD WITHOUT CONTRAST  Technique:  Contiguous axial images were obtained from the base of the skull through the vertex without contrast.  Comparison: None.  Findings: There is no evidence for acute infarction, intracranial hemorrhage, mass lesion, hydrocephalus, or extra-axial fluid. There is no atrophy or white matter disease. Gray-white junction is preserved throughout.  Calvarium is intact.  Moderate mucosal thickening in the right greater than left maxillary sinuses and bilateral ethmoid sinuses.  Negative appearing orbits.  No mastoid fluid.  IMPRESSION: No acute intracranial findings.  Definitive CT signs of anoxic injury are not observed at this time.  Continued surveillance is warranted.   Original Report Authenticated By: Davonna Belling, M.D.   Dg Chest Port 1 View  11/03/2012   *RADIOLOGY REPORT*  Clinical Data: Respiratory failure  PORTABLE CHEST - 1 VIEW  Comparison: Chest radiograph 11/02/2012  Findings: Endotracheal tube is 4.2 cm from carina in good position. Aortic balloon pump has been removed.  NG tube extends in the stomach.  Left central venous line is unchanged.  Stable cardiac silhouette with left basilar atelectasis.  There is some fine air space disease in the left lower lobe.   Right lung is clear.  IMPRESSION:  1.  Endotracheal tube in good position. 2.  Fine air space disease in the left lower lobe could represent edema, pneumonia, or pneumonitis.   Original Report Authenticated By: Genevive Bi, M.D.   Dg Chest Port 1 View  11/02/2012   *RADIOLOGY REPORT*  Clinical Data: Respiratory failure, shortness of breath  PORTABLE CHEST - 1 VIEW   Comparison:   the previous day's study  Findings: Low position of endotracheal tube, only 12 mm above carina.  Left IJ central line and nasogastric tube are stable in position.  Patchy left perihilar, retrocardiac, and patchy lower lung airspace opacities.  No effusion.  Heart size upper limits normal for technique.  A IABP tip is low, at the level of the left eighth rib.   IMPRESSION:  Low position of endotracheal tube and IABP as above.   Original Report Authenticated By: D. Andria Rhein, MD    Telemetry: sinus tachycardia    Assessment and Plan  1. CAD with anterior STEMI: s/p DES to pLAD and IABP. Echo shows LVEF is 25%. Continue Lopressor, ASA, Plavix and atorvastatin.  2. Cardiogenic shock: IABP discontinued 11/01/2012. 3. VT: In setting of STEMI. No further VT in last 48 hours. Continue amiodarone.Will consider Lifevest at discharge with echo in 3 months to determine need for ICD assuming neurologic recovery.    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 Per Neuro.  Dr. Ladona Ridgel to see Signed, Exie Parody  EP Attending  Patient seen and examined. Agree with above, exam, assessment with modifications. Main issue is encephalopathy. Will contine supportive care. Hopefully neuro status will improve.  Leonia Reeves.D.

## 2012-11-05 NOTE — Progress Notes (Signed)
I have had extensive discussions with family wife, kids. We discussed patients current circumstances and organ failures. We also discussed patient's prior wishes under circumstances such as this. Family has decided to NOT perform resuscitation if arrest but to continue current medical support for now.  Continue support for now  Mcarthur Rossetti. Tyson Alias, MD, FACP Pgr: [phone] [facility]

## 2012-11-05 NOTE — Progress Notes (Signed)
PULMONARY  / CRITICAL CARE MEDICINE  Name: Walter Frazier MRN: [id number] DOB: [birth year]    ADMISSION DATE:  10/30/2012 CONSULTATION DATE:  10/30/2012  REFERRING MD :  Diona Browner PRIMARY SERVICE: PCCM  CHIEF COMPLAINT:  Cardiac arrest  BRIEF PATIENT DESCRIPTION: 57 y/o male smoker with no known past medical history was admitted to [facility] on 5/29 after cardiac arrest from a STEMI.  He was comatose afterwards and was started on the hypothermia protocol and transferred to [facility].  SIGNIFICANT EVENTS / STUDIES:  5/29 LHC > DES to prox LAD @ [facility]. IABP placed 5/29 Transfer to [facility] for hypothermia 5/30 Echo >> EF 25%, LV severely dilated 6/02 RN noted ?myoclonus - sedation infusions resumed. Purposeful on WUA but not F/C.  6/02 Neuro consult: EEG > non-convulsive status epilepticus. Resolution of these discharges after ativan. Loaded with fosphenytoin.  6/02 CT head: NAD 6/03 24 hr EEG:    LINES / TUBES: IABP 5/29 >> 6/01 ETT 5/29 >> L IJ CVL 5/29 >> L radial A-line 5/29 >>   CULTURES: MRSA PCR 5/29 >> neg  ANTIBIOTICS: None    SUBJ/Overnight: Agitation / vent dyschrony   PHYSICAL EXAMINATION: Gen: Comatose, NAD  HEENT: WNL PULM:  coarse CV: RRR s M AB: BS+, soft, nontender, no hsm Ext: no C/C/E Neuro: RASS -4 now, myoclonus per RN   LABS: BMET  Recent Labs Lab 11/01/12 1600 11/01/12 2259 11/02/12 0429 11/02/12 0833 11/03/12 0431 11/04/12 0500 11/05/12 0500  NA 135 134* 133* 134* 131* 134* 137  K 3.7 4.2 4.1 4.0 3.7 3.8 3.2*  CL 102 103 102 103 98 99 105  CO2 23 23 24 24 24 25 24   GLUCOSE 129* 107* 105* 106* 102* 147* 157*  BUN 10 12 12 12 11 13 16   CREATININE 0.89 0.87 0.88 0.87 0.80 0.73 0.63  CALCIUM 7.8* 8.0* 8.2* 7.9* 8.4 8.9 8.5  MG  --  1.7  --   --  2.4  --   --   PHOS  --  2.2*  --   --  2.0*  --   --      CBC  Recent Labs Lab 11/03/12 0431 11/04/12 0500 11/05/12 0500  HGB 13.3 13.1 13.1  HCT 38.6* 37.6* 36.7*  WBC 18.6* 17.7* 14.8*   PLT 135* 162 169    ABG    Component Value Date/Time   PHART 7.425 11/03/2012 0645   PCO2ART 36.0 11/03/2012 0645   PO2ART 183.0* 11/03/2012 0645   HCO3 23.2 11/03/2012 0645   TCO2 24.3 11/03/2012 0645   ACIDBASEDEF 0.6 11/03/2012 0645   O2SAT 99.3 11/03/2012 0645     Recent Labs Lab 11/04/12 1543 11/04/12 1943 11/04/12 2328 11/05/12 0310 11/05/12 0727  GLUCAP 115* 150* 159* 160* 128*    CXR: patchy bilat infiltrates mild, ett wnl, line wnl   ASSESSMENT / PLAN:  CARDIOVASCULAR A: Cardiac arrest due to recurrent VT STEMI - AWMI Prox LAD occlusion, s/p DES PCI @ [facility] Cardiogenic shock - resolved.   HTN  P:  - start propofol then re assess affect from neuro needs -escalate oral lopressor  PULMONARY A: Acute respiratory failure in the setting of cardiac arrest P:   -abg reviewed last, remains on min support -wean cpap5 ps 5-10, goal 2 hrs -no extubation planned, neurostatus does not support -pcxr in am   NEUROLOGIC A:  Post anoxic encephalopathy Status epilepticus P:   -Anticonvulsants per Neuro -severe dyschrony, add propofol -daily WUA  RENAL A: Hyponatremia -  mild  Hypokalemia P:   -Correct electrolytes, K supp -use oral in future -chem in am   GASTROINTESTINAL A:  Tolerating TF P:   -Cont PPI -Cont TFs -may need flexiseal  HEMATOLOGIC A:  DV tprevention P:  - f/u cbc  - cont lovenox  INFECTIOUS Leukocytosis - afebrile P:   - monitor off abx -dc aline now  ENDOCRINE A:  Glu controlled P:   SSI  Code status> Full   35 mins CCM , updating family  Mcarthur Rossetti. Tyson Alias, MD, FACP Pgr: [phone] [facility] '

## 2012-11-05 NOTE — Progress Notes (Signed)
Pt continues to receive 4mg  midazolam PRN q2hrs for sedation. HR (110s) and BP (190s/70s)  elevated, biting down on tube, asynchronous with vent. PRN hydralazine given with minimal response. CCM paged, Dr Delton Coombes responded and notified. Awaiting orders.

## 2012-11-05 NOTE — Progress Notes (Signed)
NEURO HOSPITALIST PROGRESS NOTE   SUBJECTIVE:                                                                                                                        Remains intubated, unresponsive. Nursing staff reports frequent touch induced myoclonic like movements involving mainly upper body. Receiving 2-4 mg IV midazolam every 2 hours PRN for sedation. Dilantin 125 mg per tube TID with dilantin level 14.6 today. Continuous EEG at this moment showing disorganized generalized slowing with intermixed sharp waves over the bi frontal regions that doesn't follow an ictal pattern. Leukocytosis above 14,000 and mild hypokalemia.    OBJECTIVE:                                                                                                                           Vital signs in last 24 hours: Temp:  [99.9 F (37.7 C)-100.8 F (38.2 C)] 100.8 F (38.2 C) (06/04 0600) Pulse Rate:  [88-103] 103 (06/04 0327) Resp:  [16-37] 31 (06/04 0600) BP: (113-169)/(62-89) 164/78 mmHg (06/04 0327) SpO2:  [94 %-100 %] 94 % (06/04 0600) Arterial Line BP: (122-197)/(59-88) 187/75 mmHg (06/04 0600) FiO2 (%):  [29.5 %-33.7 %] 29.7 % (06/04 0600)  Intake/Output from previous day: 06/03 0701 - 06/04 0700 In: 1953.5 [I.V.:688.5; NG/GT:1265] Out: 1345 [Urine:1345] Intake/Output this shift:   Nutritional status:    History reviewed. No pertinent past medical history.   Neurologic Exam:  Mental status: unresponsive, intubated on the vent. CN 2-12: pupils 2 mm with sluggish response to light, no gaze preference. EOM present on Doll's maneuver. Doesn't blink to threat. Motor: infrequent decerebrate posturing noted. Sensory: doesn't react to pain. Plantars: upgoing bilaterally. Coordination and gait: unable to test.    Lab Results: No results found for this basename: cbc, bmp, coags, chol, tri, ldl, hga1c   Lipid Panel No results found for this basename: CHOL,  TRIG, HDL, CHOLHDL, VLDL, LDLCALC,  in the last 72 hours  Studies/Results: Ct Head Wo Contrast  11/03/2012   *RADIOLOGY REPORT*  Clinical Data: Coma. Possible anoxic brain injury. Cardiac arrest.  CT HEAD WITHOUT CONTRAST  Technique:  Contiguous axial images were obtained from the base of the skull through the vertex without contrast.  Comparison:  None.  Findings: There is no evidence for acute infarction, intracranial hemorrhage, mass lesion, hydrocephalus, or extra-axial fluid. There is no atrophy or white matter disease. Gray-white junction is preserved throughout.  Calvarium is intact.  Moderate mucosal thickening in the right greater than left maxillary sinuses and bilateral ethmoid sinuses.  Negative appearing orbits.  No mastoid fluid.  IMPRESSION: No acute intracranial findings.  Definitive CT signs of anoxic injury are not observed at this time.  Continued surveillance is warranted.   Original Report Authenticated By: Davonna Belling, M.D.    MEDICATIONS                                                                                                                       I have reviewed the patient's current medications.  ASSESSMENT/PLAN:                                                                                                           Anoxic encephalopathy. Prior NCSE resolved, but will continue C-EEG monitoring for next 24 hours. However, he remains exhibiting post anoxic myoclonus and will suggest adding IV keppra. Will continue to follow.  Wyatt Portela, MD [facility] [phone]  11/05/2012, 8:02 AM

## 2012-11-05 NOTE — Progress Notes (Signed)
[facility] ADULT ICU REPLACEMENT PROTOCOL FOR AM LAB REPLACEMENT ONLY  The patient does apply for the [facility] Adult ICU Electrolyte Replacment Protocol based on the criteria listed below:   1. Is GFR >/= 40 ml/min? yes  Patient's GFR today is >90 2. Is urine output >/= 0.5 ml/kg/hr for the last 6 hours? yes Patient's UOP is 0.82 ml/kg/hr 3. Is BUN < 60 mg/dL? yes  Patient's BUN today is 16 4. Abnormal electrolyte(s):k+ 3.2 5. Ordered repletion with: see orders 6. If a panic level lab has been reported, has the CCM MD in charge been notified? yes.   Physician:  Dr. Maree Krabbe, Avantae Bither A 11/05/2012 6:31 AM

## 2012-11-05 NOTE — Procedures (Signed)
ELECTROENCEPHALOGRAM REPORT   Patient: Walter Frazier       Room #: 1914 Age: 57 y.o.        Sex: male Referring Physician: Kendrick Fries Report Date:  11/05/2012        Interpreting Physician: Thana Farr D  History: Walter Frazier is an 57 y.o. male s/p cardiac arrest with persistent encephalopathy  Medications:  Scheduled: . antiseptic oral rinse  15 mL Mouth Rinse QID  . aspirin  81 mg Per Tube Daily  . atorvastatin  80 mg Per Tube q1800  . chlorhexidine  15 mL Mouth Rinse BID  . clopidogrel  75 mg Per Tube Q breakfast  . enoxaparin (LOVENOX) injection  40 mg Subcutaneous Daily  . feeding supplement  30 mL Per Tube TID  . insulin aspart  0-9 Units Subcutaneous Q4H  . levETIRAcetam  500 mg Intravenous Q12H  . metoprolol tartrate  37.5 mg Per Tube QID  . pantoprazole sodium  40 mg Per Tube QHS  . phenytoin  125 mg Per Tube TID    Conditions of Recording:  This is a continuous 16 channel EEG carried out with accompanying video monitoring from 11/04/2012 at 2:26pm to 11/05/2012 at 8:47am.  Patient is unresponsive throughout monitoring.    Description:   The background activity consists of a polymorphic delta activity that is of low voltage and fairly continuous throughout the tracing.  There is some intermittent superimposed theta and alpha rhythms that are noted as well and resemble sleep spindles.  Poorly formed sharp transients with central predominance are noted as well that are consistent with sleep transients.    No wakefulness was able to be evaluated.  No evidence of nonconvulsive seizure activity was seen.    No activating procedures were performed.  IMPRESSION: This EEG is characterized by slowing.  Many features are consistent with sleep.  Can not rule out the possibility of a diffuse cerebral disturbance that includes an encephalopathy of nonspecific etiology.     Thana Farr, MD [facility] [phone] 11/05/2012, 8:56 PM

## 2012-11-06 ENCOUNTER — Inpatient Hospital Stay (HOSPITAL_COMMUNITY): Payer: BC Managed Care – PPO

## 2012-11-06 LAB — GLUCOSE, CAPILLARY
Glucose-Capillary: 126 mg/dL — ABNORMAL HIGH (ref 70–99)
Glucose-Capillary: 140 mg/dL — ABNORMAL HIGH (ref 70–99)
Glucose-Capillary: 141 mg/dL — ABNORMAL HIGH (ref 70–99)
Glucose-Capillary: 154 mg/dL — ABNORMAL HIGH (ref 70–99)
Glucose-Capillary: 156 mg/dL — ABNORMAL HIGH (ref 70–99)

## 2012-11-06 LAB — COMPREHENSIVE METABOLIC PANEL
ALT: 62 U/L — ABNORMAL HIGH (ref 0–53)
AST: 58 U/L — ABNORMAL HIGH (ref 0–37)
Albumin: 2.3 g/dL — ABNORMAL LOW (ref 3.5–5.2)
Alkaline Phosphatase: 171 U/L — ABNORMAL HIGH (ref 39–117)
Chloride: 104 mEq/L (ref 96–112)
Potassium: 3.3 mEq/L — ABNORMAL LOW (ref 3.5–5.1)
Total Bilirubin: 0.5 mg/dL (ref 0.3–1.2)

## 2012-11-06 LAB — CBC
Platelets: 167 10*3/uL (ref 150–400)
RDW: 13.8 % (ref 11.5–15.5)
WBC: 16.7 10*3/uL — ABNORMAL HIGH (ref 4.0–10.5)

## 2012-11-06 MED ORDER — POTASSIUM CHLORIDE 10 MEQ/50ML IV SOLN: 10.0000 meq | INTRAVENOUS | Status: DC

## 2012-11-06 MED ORDER — PRO-STAT SUGAR FREE PO LIQD
30.0000 mL | Freq: Four times a day (QID) | ORAL | Status: DC
  Administered 2012-11-06 – 2012-11-09 (×14): 30 mL
  Filled 2012-11-06 (×18): qty 30

## 2012-11-06 MED ORDER — POTASSIUM CHLORIDE 20 MEQ/15ML (10%) PO LIQD
ORAL | Status: AC
  Administered 2012-11-06: 40 meq
  Filled 2012-11-06: qty 30

## 2012-11-06 MED ORDER — JEVITY 1.2 CAL PO LIQD
1000.0000 mL | ORAL | Status: DC
  Administered 2012-11-07: 45 mL/h
  Administered 2012-11-08: 12:00:00
  Filled 2012-11-06 (×6): qty 1000

## 2012-11-06 MED ORDER — POTASSIUM CHLORIDE 20 MEQ/15ML (10%) PO LIQD
40.0000 meq | Freq: Once | ORAL | Status: AC
  Filled 2012-11-06: qty 30

## 2012-11-06 NOTE — Progress Notes (Signed)
Patient Name: Walter Frazier Date of Encounter: 11/06/2012     Principal Problem:   Cardiac arrest Active Problems:   STEMI (ST elevation myocardial infarction)   V-tach   Acute respiratory failure with hypoxia   Encephalopathy acute   Status epilepticus    SUBJECTIVE: Intubated, sedated. Myoclonic activity overnight. Keppra initiated by neuro. Per RN, family to meet again today to discuss care.    OBJECTIVE  Filed Vitals:   11/06/12 0327 11/06/12 0400 11/06/12 0500 11/06/12 0807  BP: 102/64 106/64 151/133 126/82  Pulse: 83   91  Temp:  99.3 F (37.4 C) 99.1 F (37.3 C) 99.1 F (37.3 C)  TempSrc:      Resp: 24 24 26 23   Height:      Weight:   80.7 kg (177 lb 14.6 oz)   SpO2: 100% 99% 97% 100%    Intake/Output Summary (Last 24 hours) at 11/06/12 0815 Last data filed at 11/06/12 0540  Gross per 24 hour  Intake 1970.51 ml  Output   1000 ml  Net 970.51 ml   Weight change:   PHYSICAL EXAM  General: Critically ill appearing Head: Normocephalic, atraumatic,  nares are without discharge.  Neck: Supple without bruits or JVD. Lungs:  Respiration ventilator dependent, no appreciable wheezes, rales or rhonchi Heart: RRR no s3, s4, or murmurs. Abdomen: Distending, hypoactive bowel sounds, no appreciable masses Msk:  Strength unable to be assessed, and tone appears normal for age. Extremities: No clubbing, cyanosis or edema. DP/PT/Radials 2+ and equal bilaterally. Neuro: Sedated, no appreciable myoclonic activity on exam Psych: Sedated  LABS:  Recent Labs     11/05/12  0500  11/06/12  0500  WBC  14.8*  16.7*  HGB  13.1  13.3  HCT  36.7*  37.2*  MCV  90.0  90.3  PLT  169  167   Recent Labs Lab 11/04/12 0500 11/05/12 0500 11/06/12 0500  NA 134* 137 139  K 3.8 3.2* 3.3*  CL 99 105 104  CO2 25 24 23   BUN 13 16 16   CREATININE 0.73 0.63 0.60  CALCIUM 8.9 8.5 8.5  PROT  --   --  6.1  BILITOT  --   --  0.5  ALKPHOS  --   --  171*  ALT  --   --  62*    AST  --   --  58*  GLUCOSE 147* 157* 138*    TELE: NSR, 80 bpm  Radiology/Studies:  Ct Head Wo Contrast  11/03/2012   *RADIOLOGY REPORT*  Clinical Data: Coma. Possible anoxic brain injury. Cardiac arrest.  CT HEAD WITHOUT CONTRAST  Technique:  Contiguous axial images were obtained from the base of the skull through the vertex without contrast.  Comparison: None.  Findings: There is no evidence for acute infarction, intracranial hemorrhage, mass lesion, hydrocephalus, or extra-axial fluid. There is no atrophy or white matter disease. Gray-white junction is preserved throughout.  Calvarium is intact.  Moderate mucosal thickening in the right greater than left maxillary sinuses and bilateral ethmoid sinuses.  Negative appearing orbits.  No mastoid fluid.  IMPRESSION: No acute intracranial findings.  Definitive CT signs of anoxic injury are not observed at this time.  Continued surveillance is warranted.   Original Report Authenticated By: Davonna Belling, M.D.   Dg Chest Port 1 View  11/05/2012   *RADIOLOGY REPORT*  Clinical Data: Follow up respiratory failure  PORTABLE CHEST - 1 VIEW  Comparison: 11/03/2012  Findings: There is an endotracheal  tube with tip above the carina. Left sided IJ catheter is noted with tip in the SVC.  Nasogastric tube is in place.  Heart size is normal.  Mild patchy airspace opacities within the right upper lobe and throughout the left lung are identified and appear slightly increased from previous exam.  IMPRESSION:  1.  Stable support apparatus. 2.  Slight increase and bilateral patchy airspace densities.   Original Report Authenticated By: Signa Kell, M.D.   Dg Chest Port 1 View  11/03/2012   *RADIOLOGY REPORT*  Clinical Data: Respiratory failure  PORTABLE CHEST - 1 VIEW  Comparison: Chest radiograph 11/02/2012  Findings: Endotracheal tube is 4.2 cm from carina in good position. Aortic balloon pump has been removed.  NG tube extends in the stomach.  Left central venous line is  unchanged.  Stable cardiac silhouette with left basilar atelectasis.  There is some fine air space disease in the left lower lobe.   Right lung is clear.  IMPRESSION:  1.  Endotracheal tube in good position. 2.  Fine air space disease in the left lower lobe could represent edema, pneumonia, or pneumonitis.   Original Report Authenticated By: Genevive Bi, M.D.   Dg Chest Port 1 View  11/02/2012   *RADIOLOGY REPORT*  Clinical Data: Respiratory failure, shortness of breath  PORTABLE CHEST - 1 VIEW  Comparison:   the previous day's study  Findings: Low position of endotracheal tube, only 12 mm above carina.  Left IJ central line and nasogastric tube are stable in position.  Patchy left perihilar, retrocardiac, and patchy lower lung airspace opacities.  No effusion.  Heart size upper limits normal for technique.  A IABP tip is low, at the level of the left eighth rib.   IMPRESSION:  Low position of endotracheal tube and IABP as above.   Original Report Authenticated By: D. Andria Rhein, MD   Dg Chest Port 1 View  11/01/2012   *RADIOLOGY REPORT*  Clinical Data: Acute respiratory failure on ventilator.  Hiccups.  PORTABLE CHEST - 1 VIEW  Comparison: 11/01/2012  Findings: Support apparatus remains in appropriate position.  There is persistent opacity in the left retrocardiac lung base which is unchanged.  Right lung remains clear.  No new or worsening areas of pulmonary opacity are seen.  Heart size remains within normal limits.  IMPRESSION: Stable left retrocardiac opacity.   Original Report Authenticated By: Myles Rosenthal, M.D.   Dg Chest Port 1 View  11/01/2012   *RADIOLOGY REPORT*  Clinical Data: Respiratory failure  PORTABLE CHEST - 1 VIEW  Comparison:   the previous day's study  Findings: Endotracheal tube, nasogastric tube, and left IJ central line are stable in position.  IABP tip projects at the lower margin of the aortic arch as before.  Left retrocardiac consolidation / atelectasis slightly increased.   Right lung clear.  No effusion.  IMPRESSION:  1.  Some increase in left retrocardiac consolidation / atelectasis. 2. Support hardware stable in position.   Original Report Authenticated By: D. Andria Rhein, MD   Dg Chest Port 1 View  10/31/2012   *RADIOLOGY REPORT*  Clinical Data: Repositioning of the intra-aortic balloon pump.  PORTABLE CHEST - 1 VIEW  Comparison: Chest radiograph performed earlier today at 01:22 a.m.  Findings: The patient's intra-aortic balloon pump tip is seen at the aortic arch, approximately 1.5 cm above the level of the carina.  This could be retracted 1.5 cm, if deemed clinically necessary.  The left IJ line is noted ending about  the distal SVC.  An external pacing pad is again noted overlying the left hemithorax.  The patient's endotracheal tube is seen ending 4 cm above the carina.  The lungs are well-aerated and appear grossly clear.  There is no evidence of focal opacification, pleural effusion or pneumothorax.  The cardiomediastinal silhouette is within normal limits.  No acute osseous abnormalities are seen.  IMPRESSION:  1.  Intra-aortic balloon pump tip noted at the aortic arch, 1.5 cm above the level of the carina.  This could be retracted 1.5 cm, if deemed clinically necessary. 2.  No other changes from the prior study.   Original Report Authenticated By: Tonia Ghent, M.D.   Dg Chest Portable 1 View  10/31/2012   *RADIOLOGY REPORT*  Clinical Data: Moved intra-aortic balloon and endotracheal tube.  PORTABLE CHEST - 1 VIEW  Comparison: Chest radiograph performed earlier today at 12:05 a.m.  Findings: The patient's endotracheal tube is seen ending 4 cm above the carina.  The patient's left IJ line is noted ending about the distal SVC. The intra-aortic balloon pump tip is still noted overlying the aortic knob, approximately 2 cm more superior than expected.  The lungs are clear bilaterally.  No focal consolidation, pleural effusion or pneumothorax is seen.  A left-sided external  pacing pad is again noted.  The patient's enteric tube is seen ending at the fundus of the stomach.  No acute osseous abnormalities are seen.  IMPRESSION:  1.  Intra-aortic balloon pump tip still seen overlying the aortic arch, above the level of the carina, approximately 2 cm more superior than expected.  Would retract this by 2 cm, as deemed clinically appropriate. 2.  Endotracheal tube noted in expected position, ending 4 cm above the carina. 3.  No acute cardiopulmonary process seen.   Original Report Authenticated By: Tonia Ghent, M.D.   Dg Chest Portable 1 View  10/31/2012   *RADIOLOGY REPORT*  Clinical Data: Central line placement.  PORTABLE CHEST - 1 VIEW  Comparison: Chest radiograph performed 10/30/2012  Findings: The patient's endotracheal tube is seen ending 6-7 cm above the carina.  A left IJ line is noted ending about the distal SVC.  An enteric tube is noted extending below the diaphragm.  The patient's intra-aortic balloon pump tip has migrated approximately 2 cm superiorly since the prior study.  The lungs are well expanded.  Minimal left basilar density is thought to reflect normal vasculature and the overlying external pacing pad.  No pleural effusion or pneumothorax is seen.  The cardiomediastinal silhouette remains normal in size.  No acute osseous abnormalities are seen.  IMPRESSION:  1.  Left IJ line noted ending about the distal SVC. 2.  Endotracheal tube seen ending 6-7 cm above the carina.  Would advance this by 3-4 cm. 3.  Intra-aortic balloon pump tip has migrated 2 cm superiorly since the prior study. Would retract this by 2 cm. 4.  No acute cardiopulmonary process seen.  These results were called by telephone on 10/31/2012 at 12:31 a.m. to Nursing on [id number], who verbally acknowledged these results.   Original Report Authenticated By: Tonia Ghent, M.D.   Dg Chest Port 1 View  10/30/2012   *RADIOLOGY REPORT*  Clinical Data: Assess intra-aortic balloon pump.  PORTABLE CHEST - 1  VIEW  Comparison: None.  Findings: The patient's endotracheal tube is seen ending 5-6 cm above the carina.  An enteric tube is noted extending below the diaphragm.  The tip of the intra-aortic balloon pump is seen just  below the aortic arch, just above the level of the carina, in appropriate position.  The lungs are relatively well expanded.  Mild vascular congestion is noted, without definite pulmonary edema.  The external pacing pad is noted overlying the left hemithorax.  No pleural effusion or pneumothorax is seen.  The cardiomediastinal silhouette is normal in size.  No acute osseous abnormalities are identified.  IMPRESSION:  1.  Tip of the intra-aortic balloon pump noted just above the level of the carina and just below the aortic arch, in appropriate position. 2.  Endotracheal tube seen ending 5-6 cm above the carina. 3.  Mild vascular congestion, without definite pulmonary edema.   Original Report Authenticated By: Tonia Ghent, M.D.    Current Medications:  . antiseptic oral rinse  15 mL Mouth Rinse QID  . aspirin  81 mg Per Tube Daily  . atorvastatin  80 mg Per Tube q1800  . chlorhexidine  15 mL Mouth Rinse BID  . clopidogrel  75 mg Per Tube Q breakfast  . enoxaparin (LOVENOX) injection  40 mg Subcutaneous Daily  . feeding supplement  30 mL Per Tube TID  . insulin aspart  0-9 Units Subcutaneous Q4H  . levETIRAcetam  500 mg Intravenous Q12H  . metoprolol tartrate  37.5 mg Per Tube QID  . pantoprazole sodium  40 mg Per Tube QHS  . phenytoin  125 mg Per Tube TID    ASSESSMENT AND PLAN:  1. CAD with anterior STEMI: s/p DES-prox LAD and IABP placement at [facility]. LVEF 25%.   - Continue ASA, Plavix, BB, statin  - Consider adding low dose ACEi  2. Cardiogenic shock: LVEF 25%. IABP discontinued 11/01/12.    3. Recurrent VT: no further VT per telemetry review. Maintaining NSR. Amiodarone discontinue for unclear reasons.   - Restart amiodarone for VT prophylaxis  - Consider LifeVest + echo  in 3 months for consideration of ICD placement-- this will be dependent on neurologic status  4. Anoxic encephalopathy: remains sedated. Myoclonic activity noted yesterday. Started on Keppra. EEG with findings suggestive of generalized encephalopathy. Neurologic status unchanged.    5. Acute respiratory failure: intubated.  6. Leukocytosis: stable. Febrile yesterday. Afebrile today. Pulm/CCM following.   7. Hypokalemia: replete.    Signed, R. Hurman Horn, PA-C 11/06/2012, 8:15 AM  Patient seen and examined.  I have amended progress note above to reflect my findings

## 2012-11-06 NOTE — Progress Notes (Signed)
Found Pt's EEG monitoring screen not recording and later  system shut down. Neurology oncall informed. EEG tech to restart in the morning.

## 2012-11-06 NOTE — Progress Notes (Signed)
PULMONARY  / CRITICAL CARE MEDICINE  Name: Walter Frazier MRN: [id number] DOB: [birth year]    ADMISSION DATE:  10/30/2012 CONSULTATION DATE:  10/30/2012  REFERRING MD :  Diona Browner PRIMARY SERVICE: PCCM  CHIEF COMPLAINT:  Cardiac arrest  BRIEF PATIENT DESCRIPTION: 57 y/o male smoker with no known past medical history was admitted to [facility] on 5/29 after cardiac arrest from a STEMI.  He was comatose afterwards and was started on the hypothermia protocol and transferred to [facility]  SIGNIFICANT EVENTS / STUDIES:  5/29 LHC > DES to prox LAD @ [facility]. IABP placed 5/29 Transfer to [facility] for hypothermia 5/30 Echo >> EF 25%, LV severely dilated 6/02 RN noted ?myoclonus - sedation infusions resumed. Purposeful on WUA but not F/C.  6/02 Neuro consult: EEG > non-convulsive status epilepticus. Resolution of these discharges after ativan. Loaded with fosphenytoin.  6/02 CT head: NAD 6/03 24 hr EEG:  Diffuse slowing, suggestive of general encephalopathy 6/04 Started on Keppra   LINES / TUBES: IABP 5/29 >> 6/01 ETT 5/29 >> L IJ CVL 5/29 >> L radial A-line 5/29 >> 6/4  CULTURES: MRSA PCR 5/29 >> neg  ANTIBIOTICS: None    SUBJ/Overnight: Pos balance  PHYSICAL EXAMINATION: Gen: Comatose, NAD  HEENT: ett wnl, jvd up PULM:  Coarse bilat CV: RRR s M AB: BS+, soft, nontender, no hsm Ext: no C/C/E Neuro: RASS -3, with WUA, biting tube, nonpurposeful, perrl slight reactive, ? Posture? uppers  LABS: BMET  Recent Labs Lab 11/01/12 1600 11/01/12 2259  11/02/12 0833 11/03/12 0431 11/04/12 0500 11/05/12 0500 11/06/12 0500  NA 135 134*  < > 134* 131* 134* 137 139  K 3.7 4.2  < > 4.0 3.7 3.8 3.2* 3.3*  CL 102 103  < > 103 98 99 105 104  CO2 23 23  < > 24 24 25 24 23   GLUCOSE 129* 107*  < > 106* 102* 147* 157* 138*  BUN 10 12  < > 12 11 13 16 16   CREATININE 0.89 0.87  < > 0.87 0.80 0.73 0.63 0.60  CALCIUM 7.8* 8.0*  < > 7.9* 8.4 8.9 8.5 8.5  MG  --  1.7  --   --  2.4  --   --   --   PHOS   --  2.2*  --   --  2.0*  --   --   --   < > = values in this interval not displayed.   CBC  Recent Labs Lab 11/04/12 0500 11/05/12 0500 11/06/12 0500  HGB 13.1 13.1 13.3  HCT 37.6* 36.7* 37.2*  WBC 17.7* 14.8* 16.7*  PLT 162 169 167    ABG    Component Value Date/Time   PHART 7.425 11/03/2012 0645   PCO2ART 36.0 11/03/2012 0645   PO2ART 183.0* 11/03/2012 0645   HCO3 23.2 11/03/2012 0645   TCO2 24.3 11/03/2012 0645   ACIDBASEDEF 0.6 11/03/2012 0645   O2SAT 99.3 11/03/2012 0645     Recent Labs Lab 11/05/12 1604 11/05/12 1934 11/06/12 0008 11/06/12 0411 11/06/12 0807  GLUCAP 152* 141* 115* 113* 154*    CXR: patchy bilat infiltrates improved, ett wnl   ASSESSMENT / PLAN:  CARDIOVASCULAR A: Cardiac arrest due to recurrent VT STEMI - AWMI Prox LAD occlusion, s/p DES PCI @ [facility] Cardiogenic shock - resolved.   HTN more controlled  P:  - bp better controlled with higher dose lopressor - cont sedation with propofol, versed, fentanyl.  Wean as possible, see neuro  PULMONARY  A: Acute respiratory failure in the setting of cardiac arrest P:   -abg reviewed last, remains on min support -cont wean ps goal 10-8, goal 2 hrs -no extubation planned, neurostatus does not support -may need to limit pos balance  NEUROLOGIC A:  Post anoxic encephalopathy Status epilepticus P:   -Anticonvulsants per Neuro -severe dyschrony better now with propofol -daily WUA (failed today due to ETT intolerance) -Prognosis does not appear to be good at this time -continue to review EEG results with neuro -goal to use short acting propofol, dc versed as goal then fentanyl to prn -add versed q4h  RENAL A: Hyponatremia - mild  Hypokalemia P:   -Correct electrolytes, K supp -chem in am   GASTROINTESTINAL A:  Tolerating TF P:   -Cont PPI -Cont TFs  HEMATOLOGIC A:  DVt prevention P:  - f/u cbc  - cont lovenox, daily crt needed  INFECTIOUS Leukocytosis - afebrile P:   - monitor  off abx, no other obvious signs of infection -patchy infiltrates improved  ENDOCRINE A:  Glu controlled P:   SSI  Code status> Full   30 mins CCM , updating family as well  Mcarthur Rossetti. Tyson Alias, MD, FACP Pgr: [phone] [facility] '

## 2012-11-06 NOTE — Progress Notes (Signed)
Chaplain Note: Referral from church. Visited with pt's family in waiting area and at bedside.  Provided ministry of presence and spiritual support.  Prayed with family at bedside.  Will continue to follow up as needed.  Rutherford Nail Chaplain [phone]

## 2012-11-06 NOTE — Progress Notes (Signed)
NUTRITION FOLLOW UP  Intervention:   1. Decrease Jevity 1.2 to a new goal rate of 45 ml/hr.  2. Increase Pro-stat to QID.  This new EN regimen will provide 1696 kcal, 120 gm protein and 872 ml free water. This better meets pt kcal/protein goals with the additional kcals from Propofol.   Nutrition Dx:   nadequate oral intake related to inability to eat as evidenced by NPO diet.   Goal:   Meet >/=90% estimated nutrition need. unmet  Monitor:   Vent status, weight trends, labs, I/O's   Assessment:   Pt remains intubated, now post hypothermia protocol. Pt continues with poor mental status, Neurology consulted and EEG completed. Pt has had several episodes of asynchrony with vent, biting down on tube, and agitation. Pt now on Versed, Fentanyl, and Propofol.   Currently pt has OG tube with Jevity 1.2 running at a goal rate of 55 ml/hr, and 30 ml Pro-stat TID. This is providing 1884 kcal, 118 gm protein, and 1065 ml free water.   Height: Ht Readings from Last 1 Encounters:  10/30/12 5\' 9"  (1.753 m)    Weight Status:   Wt Readings from Last 1 Encounters:  11/06/12 177 lb 14.6 oz (80.7 kg)   Patient is currently intubated on ventilator support.  MV: 11.6 Temp:Temp (24hrs), Avg:99.6 F (37.6 C), Min:98.8 F (37.1 C), Max:100.4 F (38 C)  Propofol: 6.9 ml/hr, providing 182 kcal from lipids daily  Re-estimated needs:  Kcal: 1891 Protein: >/= 114 gm  Fluid: >/= 1.5 L   Skin: intact   Diet Order:  NPO   Intake/Output Summary (Last 24 hours) at 11/06/12 1304 Last data filed at 11/06/12 1200  Gross per 24 hour  Intake   1853 ml  Output   1140 ml  Net    713 ml    Last BM: 6/4   Labs:   Recent Labs Lab 11/01/12 1600 11/01/12 2259  11/03/12 0431 11/04/12 0500 11/05/12 0500 11/06/12 0500  NA 135 134*  < > 131* 134* 137 139  K 3.7 4.2  < > 3.7 3.8 3.2* 3.3*  CL 102 103  < > 98 99 105 104  CO2 23 23  < > 24 25 24 23   BUN 10 12  < > 11 13 16 16   CREATININE 0.89  0.87  < > 0.80 0.73 0.63 0.60  CALCIUM 7.8* 8.0*  < > 8.4 8.9 8.5 8.5  MG  --  1.7  --  2.4  --   --   --   PHOS  --  2.2*  --  2.0*  --   --   --   GLUCOSE 129* 107*  < > 102* 147* 157* 138*  < > = values in this interval not displayed.  CBG (last 3)   Recent Labs  11/06/12 0008 11/06/12 0411 11/06/12 0807  GLUCAP 115* 113* 154*    Scheduled Meds: . antiseptic oral rinse  15 mL Mouth Rinse QID  . aspirin  81 mg Per Tube Daily  . atorvastatin  80 mg Per Tube q1800  . chlorhexidine  15 mL Mouth Rinse BID  . clopidogrel  75 mg Per Tube Q breakfast  . enoxaparin (LOVENOX) injection  40 mg Subcutaneous Daily  . feeding supplement  30 mL Per Tube TID  . insulin aspart  0-9 Units Subcutaneous Q4H  . levETIRAcetam  500 mg Intravenous Q12H  . metoprolol tartrate  37.5 mg Per Tube QID  . pantoprazole sodium  40 mg Per Tube QHS  . phenytoin  125 mg Per Tube TID  . potassium chloride  40 mEq Per Tube Once    Continuous Infusions: . sodium chloride Stopped (11/05/12 0920)  . sodium chloride 20 mL/hr at 11/06/12 0540  . feeding supplement (JEVITY 1.2 CAL) 55 mL/hr (11/06/12 0900)  . fentaNYL infusion INTRAVENOUS 25 mcg/hr (11/05/12 1600)  . propofol 15 mcg/kg/min (11/06/12 1122)    Clarene Duke RD, LDN Pager [phone] After Hours pager [phone]

## 2012-11-06 NOTE — Progress Notes (Signed)
LTM EEG discontinued.  

## 2012-11-06 NOTE — Procedures (Signed)
Continuous EEG report:  Technique: This is a continuous 16 channel EEG carried out with accompanying video monitoring from 11/06/2012 at 8:34 am to 11/06/2012 at 3 pm.  Patient is unresponsive, intubated on the vent throughout the test.  Description:  As the study begins and throughout the entire recording, there is diffuse, continuous, low voltage polymorphic delta activity that at times becomes theta and doesn't follow an ictal pattern at any time. Poorly formed sharp transients with central predominance are noted as well that are consistent with sleep transients.  No evidence of electrographic seizures noted. IMPRESSION:  This EEG is abnormal with findings that seem to be consistent with a moderately severe encephalopathy of nonspecific etiology.  Importantly, there is not evidence of electrographic seizures.  Clinical correlation is advised.  Walter Frazier ,MD [facility].

## 2012-11-07 ENCOUNTER — Inpatient Hospital Stay (HOSPITAL_COMMUNITY): Payer: BC Managed Care – PPO

## 2012-11-07 LAB — BASIC METABOLIC PANEL
BUN: 14 mg/dL (ref 6–23)
Chloride: 108 mEq/L (ref 96–112)
Creatinine, Ser: 0.65 mg/dL (ref 0.50–1.35)
GFR calc Af Amer: >90 mL/min (ref >90)
GFR calc non Af Amer: >90 mL/min (ref >90)
Potassium: 3.4 mEq/L — ABNORMAL LOW (ref 3.5–5.1)

## 2012-11-07 LAB — GLUCOSE, CAPILLARY
Glucose-Capillary: 112 mg/dL — ABNORMAL HIGH (ref 70–99)
Glucose-Capillary: 115 mg/dL — ABNORMAL HIGH (ref 70–99)
Glucose-Capillary: 120 mg/dL — ABNORMAL HIGH (ref 70–99)
Glucose-Capillary: 144 mg/dL — ABNORMAL HIGH (ref 70–99)
Glucose-Capillary: 152 mg/dL — ABNORMAL HIGH (ref 70–99)

## 2012-11-07 LAB — CBC
HCT: 36.2 % — ABNORMAL LOW (ref 39.0–52.0)
Hemoglobin: 12.5 g/dL — ABNORMAL LOW (ref 13.0–17.0)
MCHC: 34.5 g/dL (ref 30.0–36.0)
RDW: 13.9 % (ref 11.5–15.5)
WBC: 18.2 10*3/uL — ABNORMAL HIGH (ref 4.0–10.5)

## 2012-11-07 MED ORDER — FUROSEMIDE 10 MG/ML IJ SOLN: 20.0000 mg | Freq: Once | INTRAMUSCULAR | Status: AC

## 2012-11-07 MED ORDER — FUROSEMIDE 10 MG/ML IJ SOLN
INTRAMUSCULAR | Status: AC
  Administered 2012-11-07: 20 mg
  Filled 2012-11-07: qty 4

## 2012-11-07 MED ORDER — POTASSIUM CHLORIDE 20 MEQ/15ML (10%) PO LIQD
20.0000 meq | ORAL | Status: AC
  Administered 2012-11-07 (×2): 20 meq
  Filled 2012-11-07 (×2): qty 15

## 2012-11-07 MED ORDER — LISINOPRIL 5 MG PO TABS
5.0000 mg | ORAL_TABLET | Freq: Every day | ORAL | Status: DC
  Administered 2012-11-07 – 2012-11-09 (×3): 5 mg via ORAL
  Filled 2012-11-07 (×4): qty 1

## 2012-11-07 NOTE — Progress Notes (Signed)
Subjective: Intubated  With sedation eased he is more agitated Objective: Filed Vitals:   11/07/12 0500 11/07/12 0600 11/07/12 0700 11/07/12 0746  BP: 125/71 118/68 126/63 126/63  Pulse:    90  Temp: 99.5 F (37.5 C) 99.5 F (37.5 C) 99.5 F (37.5 C) 99.3 F (37.4 C)  TempSrc:      Resp: 20 22 23 20   Height:      Weight:      SpO2: 99% 99% 98% 98%   Weight change:   Intake/Output Summary (Last 24 hours) at 11/07/12 0757 Last data filed at 11/07/12 0600  Gross per 24 hour  Intake 2667.46 ml  Output   2745 ml  Net -77.54 ml    General: INtubated.  Agitated  Restless Neck:  JVP is normal Heart: Regular rate and rhythm, without murmurs, rubs, gallops.  Lungs: Coarse BS Exemities:  No edema.   .  Tele:  SR  Lab Results: Results for orders placed during the hospital encounter of 10/30/12 (from the past 24 hour(s))  GLUCOSE, CAPILLARY     Status: Abnormal   Collection Time    11/06/12  8:07 AM      Result Value Range   Glucose-Capillary 154 (*) 70 - 99 mg/dL  GLUCOSE, CAPILLARY     Status: Abnormal   Collection Time    11/06/12 12:33 PM      Result Value Range   Glucose-Capillary 126 (*) 70 - 99 mg/dL  GLUCOSE, CAPILLARY     Status: Abnormal   Collection Time    11/06/12  4:08 PM      Result Value Range   Glucose-Capillary 140 (*) 70 - 99 mg/dL  GLUCOSE, CAPILLARY     Status: Abnormal   Collection Time    11/06/12  8:20 PM      Result Value Range   Glucose-Capillary 144 (*) 70 - 99 mg/dL  GLUCOSE, CAPILLARY     Status: Abnormal   Collection Time    11/06/12 11:42 PM      Result Value Range   Glucose-Capillary 156 (*) 70 - 99 mg/dL  GLUCOSE, CAPILLARY     Status: Abnormal   Collection Time    11/07/12  3:34 AM      Result Value Range   Glucose-Capillary 120 (*) 70 - 99 mg/dL  BASIC METABOLIC PANEL     Status: Abnormal   Collection Time    11/07/12  4:25 AM      Result Value Range   Sodium 144  135 - 145 mEq/L   Potassium 3.4 (*) 3.5 - 5.1 mEq/L   Chloride 108  96 - 112 mEq/L   CO2 27  19 - 32 mEq/L   Glucose, Bld 129 (*) 70 - 99 mg/dL   BUN 14  6 - 23 mg/dL   Creatinine, Ser 0.98  0.50 - 1.35 mg/dL   Calcium 8.5  8.4 - 11.9 mg/dL   GFR calc non Af Amer >90  >90 mL/min   GFR calc Af Amer >90  >90 mL/min  CBC     Status: Abnormal   Collection Time    11/07/12  4:25 AM      Result Value Range   WBC 18.2 (*) 4.0 - 10.5 K/uL   RBC 3.99 (*) 4.22 - 5.81 MIL/uL   Hemoglobin 12.5 (*) 13.0 - 17.0 g/dL   HCT 14.7 (*) 82.9 - 56.2 %   MCV 90.7  78.0 - 100.0 fL   MCH 31.3  26.0 -  34.0 pg   MCHC 34.5  30.0 - 36.0 g/dL   RDW 14.7  82.9 - 56.2 %   Platelets 199  150 - 400 K/uL  GLUCOSE, CAPILLARY     Status: Abnormal   Collection Time    11/07/12  7:19 AM      Result Value Range   Glucose-Capillary 130 (*) 70 - 99 mg/dL    Studies/Results: @RISRSLT24 @  Medications: Reviewed   @PROBHOSP @  1.  Cardiac.  S/p anterior STEMI with DES to prox LAD and  IABP  LVEF 25% On ASA, Plavix, BB statin  WIll add low dose ACE I.  Follow BP  2.  VT  No recurrence  AMio continued  3.  Neuro.  Patient with encephalopathy.  Slow recovery  Neuro following  4.  PUlm  CCM following re vent  LOS: 8 days   Dietrich Pates 11/07/2012, 7:57 AM

## 2012-11-07 NOTE — Progress Notes (Signed)
PULMONARY  / CRITICAL CARE MEDICINE  Name: Walter Frazier MRN: [id number] DOB: Mar 30, 1956    ADMISSION DATE:  10/30/2012 CONSULTATION DATE:  10/30/2012  REFERRING MD :  Diona Browner PRIMARY SERVICE: PCCM  CHIEF COMPLAINT:  Cardiac arrest  BRIEF PATIENT DESCRIPTION: 57 y/o male smoker with no known past medical history was admitted to [facility] on 5/29 after cardiac arrest from a STEMI.  He was comatose afterwards and was started on the hypothermia protocol and transferred to [facility].  SIGNIFICANT EVENTS / STUDIES:  5/29 LHC > DES to prox LAD @ [facility]. IABP placed 5/29 Transfer to [facility] for hypothermia 5/30 Echo >> EF 25%, LV severely dilated 6/02 RN noted ?myoclonus - sedation infusions resumed. Purposeful on WUA but not F/C.  6/02 Neuro consult: EEG > non-convulsive status epilepticus. Resolution of these discharges after ativan. Loaded with fosphenytoin.  6/02 CT head: NAD 6/03 24 hr EEG:  Diffuse slowing, suggestive of general encephalopathy 6/04 Started on Keppra 6/6- daily progress neuro wise, severe agiation  LINES / TUBES: IABP 5/29 >> 6/01 ETT 5/29 >> L IJ CVL 5/29 >> L radial A-line 5/29 >> 6/4  CULTURES: MRSA PCR 5/29 >> neg  ANTIBIOTICS: None    SUBJ/Overnight: Pulled ngt out  PHYSICAL EXAMINATION: Gen: per rn severe agitation, but moves all etx HEENT: ett wnl, jvd up PULM:  ronchi bilat CV: RRR s M AB: BS+, soft, nontender, no hsm Ext: no C/C/E Neuro: RASS 2 not following commands, ,oves all etx  LABS: BMET  Recent Labs Lab 11/01/12 1600 11/01/12 2259  11/03/12 0431 11/04/12 0500 11/05/12 0500 11/06/12 0500 11/07/12 0425  NA 135 134*  < > 131* 134* 137 139 144  K 3.7 4.2  < > 3.7 3.8 3.2* 3.3* 3.4*  CL 102 103  < > 98 99 105 104 108  CO2 23 23  < > 24 25 24 23 27   GLUCOSE 129* 107*  < > 102* 147* 157* 138* 129*  BUN 10 12  < > 11 13 16 16 14   CREATININE 0.89 0.87  < > 0.80 0.73 0.63 0.60 0.65  CALCIUM 7.8* 8.0*  < > 8.4 8.9 8.5 8.5 8.5  MG  --  1.7   --  2.4  --   --   --   --   PHOS  --  2.2*  --  2.0*  --   --   --   --   < > = values in this interval not displayed.   CBC  Recent Labs Lab 11/05/12 0500 11/06/12 0500 11/07/12 0425  HGB 13.1 13.3 12.5*  HCT 36.7* 37.2* 36.2*  WBC 14.8* 16.7* 18.2*  PLT 169 167 199    ABG    Component Value Date/Time   PHART 7.425 11/03/2012 0645   PCO2ART 36.0 11/03/2012 0645   PO2ART 183.0* 11/03/2012 0645   HCO3 23.2 11/03/2012 0645   TCO2 24.3 11/03/2012 0645   ACIDBASEDEF 0.6 11/03/2012 0645   O2SAT 99.3 11/03/2012 0645     Recent Labs Lab 11/06/12 1608 11/06/12 2020 11/06/12 2342 11/07/12 0334 11/07/12 0719  GLUCAP 140* 144* 156* 120* 130*    NWG:NFAO left hazziness   ASSESSMENT / PLAN:  CARDIOVASCULAR A: Cardiac arrest due to recurrent VT STEMI - AWMI Prox LAD occlusion, s/p DES PCI @ [facility] Cardiogenic shock - resolved.   HTN more controlled  P:  - bp better controlled with lopressor -ACEI added  PULMONARY A: Acute respiratory failure in the setting of cardiac arrest  P:   -wean this am cpap 5 ps 5, with reduced sedation -pcxr LLL eval in am  -family did not want trach, but may revisit with neuro progress Consider lasix to neg 500 cc  NEUROLOGIC A:  Post anoxic encephalopathy Status epilepticus Improved 6/5, 6/6 P:   -Anticonvulsants per Neuro -daily progress, although still not following commands -may benefit trach now  RENAL A: Hyponatremia - mild  Hypokalemia P:   -Correct electrolytes, K supp -chem in am -lasix  GASTROINTESTINAL A:  Tolerating TF P:   -Cont PPI -Cont TFs after replace NGT  HEMATOLOGIC A:  DVt prevention, mild leukocytosis P:  - f/u cbc, wbc in am  - cont lovenox  INFECTIOUS Leukocytosis - afebrile P:   -patchy infiltrates improved daily and rt side as well -diff in am   ENDOCRINE A:  Glu controlled P:   SSI  Code status> Full   30 mins CCM , updating family as well  Mcarthur Rossetti. Tyson Alias, MD, FACP Pgr:  [phone] [facility] '

## 2012-11-07 NOTE — Progress Notes (Signed)
[facility] ADULT ICU REPLACEMENT PROTOCOL FOR AM LAB REPLACEMENT ONLY  The patient does apply for the [facility] Adult ICU Electrolyte Replacment Protocol based on the criteria listed below:   1. Is GFR >/= 40 ml/min? yes  Patient's GFR today is >90 2. Is urine output >/= 0.5 ml/kg/hr for the last 6 hours? yes Patient's UOP is 2.2 ml/kg/hr 3. Is BUN < 60 mg/dL? yes  Patient's BUN today is 14 4. Abnormal electrolyte(s): K 3.4 5. Ordered repletion with: per protocol 6. If a panic level lab has been reported, has the CCM MD in charge been notified? yes.   Physician:  Dr Darrick Penna  Barnetta Chapel, Daris Aristizabal A 11/07/2012 5:41 AM

## 2012-11-07 NOTE — Progress Notes (Addendum)
NEURO HOSPITALIST PROGRESS NOTE   SUBJECTIVE:                                                                                                                        Intubated on the vent. Very agitated despite propofol. Nursing staff reports that he open his eyes, gets very restless/agitated, but has no purposeful movements. Continuous EEG discontinued earlier today as there is not evidence of electrographic seizures but a pattern that is more consistent with encephalopathy. On keppra 500 mg iv BID and dilantin 125 mg TID pert tube.  OBJECTIVE:                                                                                                                           Vital signs in last 24 hours: Temp:  [99.1 F (37.3 C)-100.2 F (37.9 C)] 99.3 F (37.4 C) (06/06 1300) Pulse Rate:  [88-102] 88 (06/06 1146) Resp:  [18-32] 23 (06/06 1300) BP: (105-154)/(59-96) 105/59 mmHg (06/06 1300) SpO2:  [93 %-100 %] 97 % (06/06 1300) FiO2 (%):  [29.6 %-97.7 %] 30 % (06/06 1300)  Intake/Output from previous day: 06/05 0701 - 06/06 0700 In: 2711.7 [I.V.:931.7; ZO/XW:9604; IV Piggyback:315] Out: 2745 [Urine:2745] Intake/Output this shift: Total I/O In: 510.6 [P.O.:60; I.V.:225.6; NG/GT:225] Out: 1600 [Urine:1600] Nutritional status:    History reviewed. No pertinent past medical history.    Neurologic Exam:  On propofol ( will not turn off as he becomes extremely agitated) Mental status: unresponsive, intubated on the vent.  CN 2-12: pupils 2 mm with sluggish response to light, no gaze preference. EOM present on Doll's maneuver. Doesn't blink to threat.  Motor: moves all extremities when gets agitated. Sensory: unreliable on propofol Plantars: upgoing bilaterally.  Coordination and gait: unable to test  Lab Results: No results found for this basename: cbc, bmp, coags, chol, tri, ldl, hga1c   Lipid Panel No results found for this basename: CHOL,  TRIG, HDL, CHOLHDL, VLDL, LDLCALC,  in the last 72 hours  Studies/Results: Dg Chest Port 1 View  11/07/2012   *RADIOLOGY REPORT*  Clinical Data: Evaluate endotracheal tube  PORTABLE CHEST - 1 VIEW  Comparison: 11/06/2012.  Findings: Endotracheal tube tip 5.9 cm above the carina.  Left central line  tip mid superior vena cava level.  Nasogastric tube tip gastric fundus level.  Subsegmental atelectasis/subtle infiltrate left mid lower lung zone. Recommend follow-up until clearance.  Interval improved aeration right midlung zone.  Pulmonary vascular prominence.  Heart size top normal.  IMPRESSION: Slight improved aeration right midlung zone.  Persistent patchy changes peripheral aspect left midlung zone may represent atelectasis or infiltrate.  Recommend follow-up until clearance.   Original Report Authenticated By: Lacy Duverney, M.D.   Dg Chest Port 1 View  11/06/2012   *RADIOLOGY REPORT*  Clinical Data: ET tube placement.  PORTABLE CHEST - 1 VIEW  Comparison: 11/05/2012  Findings: Support devices remain in stable position.  Endotracheal tube remains approximately 4.5 cm above the carina.  Patchy bilateral airspace opacities are again noted, stable or slightly increased since prior study.  Low lung volumes.  Heart is normal size.  No effusions.  IMPRESSION: Stable slight increased patchy bilateral airspace opacities.   Original Report Authenticated By: Charlett Nose, M.D.    MEDICATIONS                                                                                                                       I have reviewed the patient's current medications.  ASSESSMENT/PLAN:                                                                                                           Post anoxic encephalopathy, improving. Continue dilantin but will start weaning off keppra 6/7. Continue ICU management. Will follow up.  Wyatt Portela, MD [facility] [phone]  11/07/2012, 1:30 PM

## 2012-11-08 ENCOUNTER — Inpatient Hospital Stay (HOSPITAL_COMMUNITY): Payer: BC Managed Care – PPO

## 2012-11-08 LAB — CBC WITH DIFFERENTIAL/PLATELET
Basophils Relative: 1 % (ref 0–1)
Eosinophils Relative: 3 % (ref 0–5)
Hemoglobin: 12.4 g/dL — ABNORMAL LOW (ref 13.0–17.0)
Lymphocytes Relative: 14 % (ref 12–46)
Monocytes Relative: 9 % (ref 3–12)
Neutrophils Relative %: 73 % (ref 43–77)
Platelets: 256 10*3/uL (ref 150–400)
RBC: 3.92 MIL/uL — ABNORMAL LOW (ref 4.22–5.81)
WBC: 14.8 10*3/uL — ABNORMAL HIGH (ref 4.0–10.5)

## 2012-11-08 LAB — BASIC METABOLIC PANEL
Calcium: 8.9 mg/dL (ref 8.4–10.5)
GFR calc non Af Amer: >90 mL/min (ref >90)
Glucose, Bld: 135 mg/dL — ABNORMAL HIGH (ref 70–99)
Potassium: 3.8 mEq/L (ref 3.5–5.1)
Sodium: 145 mEq/L (ref 135–145)

## 2012-11-08 LAB — TRIGLYCERIDES: Triglycerides: 129 mg/dL (ref <150)

## 2012-11-08 LAB — GLUCOSE, CAPILLARY
Glucose-Capillary: 129 mg/dL — ABNORMAL HIGH (ref 70–99)
Glucose-Capillary: 125 mg/dL — ABNORMAL HIGH (ref 70–99)

## 2012-11-08 MED ORDER — POTASSIUM CHLORIDE 20 MEQ/15ML (10%) PO LIQD
20.0000 meq | Freq: Two times a day (BID) | ORAL | Status: AC
  Administered 2012-11-08 (×2): 20 meq
  Filled 2012-11-08 (×2): qty 15

## 2012-11-08 MED ORDER — FUROSEMIDE 10 MG/ML IJ SOLN
40.0000 mg | Freq: Two times a day (BID) | INTRAMUSCULAR | Status: AC
  Administered 2012-11-08 – 2012-11-09 (×2): 40 mg via INTRAVENOUS
  Filled 2012-11-08 (×2): qty 4

## 2012-11-08 NOTE — Progress Notes (Signed)
PULMONARY  / CRITICAL CARE MEDICINE  Name: Walter Frazier MRN: [id number] DOB: 04/10/1956    ADMISSION DATE:  10/30/2012 CONSULTATION DATE:  10/30/2012  REFERRING MD :  Diona Browner PRIMARY SERVICE: PCCM  CHIEF COMPLAINT:  Cardiac arrest  BRIEF PATIENT DESCRIPTION: 57 y/o male smoker with no known past medical history was admitted to [facility] on 5/29 after cardiac arrest from a STEMI.  He was comatose afterwards and was started on the hypothermia protocol and transferred to [facility].  SIGNIFICANT EVENTS / STUDIES:  5/29 - LHC > DES to prox LAD @ [facility]. IABP placed 5/29 - Transfer to [facility] for hypothermia 5/30 - Echo >> EF 25%, LV severely dilated 6/02 - RN noted ?myoclonus - sedation infusions resumed. Purposeful on WUA but not F/C.  6/02 - Neuro consult: EEG > non-convulsive status epilepticus. Resolution of these discharges after ativan. Loaded with fosphenytoin.  6/02 - CT head: NAD 6/03 - 24 hr EEG:  Diffuse slowing, suggestive of general encephalopathy 6/04 - Started on Keppra 6/06 - daily progress neuro wise, severe agiation 6/07 - weaned for 1 hour on PSV, agitation limiting factor  LINES / TUBES: IABP 5/29 >> 6/01 ETT 5/29 >> L IJ CVL 5/29 >> L radial A-line 5/29 >> 6/4  CULTURES: MRSA PCR 5/29 >> neg  ANTIBIOTICS: None   SUBJ/Overnight: Weaned on PSV for 1 hour, had to go back on full support due to agitation  PHYSICAL EXAMINATION: Gen:  wdwn adult male, sedate on vent, no distress HEENT: ett wnl, jvd PULM:  resp's even/non-labored on vent, lungs bilaterally with scattered rhonchi CV: RRR s M AB: BS+, soft, nontender, no hsm Ext: warm/dry, trace edema Neuro: moves all ext on WUA, but no follow commands  LABS: BMET  Recent Labs Lab 11/01/12 1600 11/01/12 2259  11/03/12 0431 11/04/12 0500 11/05/12 0500 11/06/12 0500 11/07/12 0425 11/08/12 0500  NA 135 134*  < > 131* 134* 137 139 144 145  K 3.7 4.2  < > 3.7 3.8 3.2* 3.3* 3.4* 3.8  CL 102 103  < > 98 99 105 104  108 109  CO2 23 23  < > 24 25 24 23 27 29   GLUCOSE 129* 107*  < > 102* 147* 157* 138* 129* 135*  BUN 10 12  < > 11 13 16 16 14 20   CREATININE 0.89 0.87  < > 0.80 0.73 0.63 0.60 0.65 0.61  CALCIUM 7.8* 8.0*  < > 8.4 8.9 8.5 8.5 8.5 8.9  MG  --  1.7  --  2.4  --   --   --   --   --   PHOS  --  2.2*  --  2.0*  --   --   --   --   --   < > = values in this interval not displayed.  CBC  Recent Labs Lab 11/06/12 0500 11/07/12 0425 11/08/12 0500  HGB 13.3 12.5* 12.4*  HCT 37.2* 36.2* 36.5*  WBC 16.7* 18.2* 14.8*  PLT 167 199 256    ABG    Component Value Date/Time   PHART 7.425 11/03/2012 0645   PCO2ART 36.0 11/03/2012 0645   PO2ART 183.0* 11/03/2012 0645   HCO3 23.2 11/03/2012 0645   TCO2 24.3 11/03/2012 0645   ACIDBASEDEF 0.6 11/03/2012 0645   O2SAT 99.3 11/03/2012 0645     Recent Labs Lab 11/07/12 1536 11/07/12 2006 11/07/12 2340 11/08/12 0532 11/08/12 0751  GLUCAP 152* 115* 112* 129* 125*    CXR: mild left hazziness  ASSESSMENT / PLAN:  CARDIOVASCULAR A:  Cardiac arrest - due to recurrent VT STEMI - AWMI Prox LAD occlusion - s/p DES PCI @ [facility] Cardiogenic shock - resolved.   HTN  P:  -continue PT lopressor -ACEI   PULMONARY A:  Acute respiratory failure in the setting of cardiac arrest LLL Airspace Disease - increased LLL, may be component effusion  P:   -daily PSV / WUA -follow LLL on cxr -family did not want trach initially, but may revisit with neuro progress -lasix to negative balance with ? effusions  NEUROLOGIC A:  Post anoxic encephalopathy - improvement 6/5-6 Status epilepticus   P:   -Anticonvulsants per Neuro -daily progress, although still not following commands -may benefit trach now  RENAL A: Hyponatremia - mild  Hypokalemia P:   -Correct electrolytes, K supp -chem in am -lasix  GASTROINTESTINAL A:  Tolerating TF P:   -Cont PPI -Cont TFs   HEMATOLOGIC A:   DVT prevention mild leukocytosis P:  - f/u cbc, wbc in am  -  cont lovenox  INFECTIOUS Leukocytosis - afebrile, diff reassuring P:   -holding off on abx for now, follow radiographically and clinically   ENDOCRINE A:  Glu controlled P:   SSI  Code status> Full  40 minutes CC time    Levy Pupa, MD, PhD 11/08/2012, 1:47 PM [place] [phone] or if no answer [phone]

## 2012-11-08 NOTE — Progress Notes (Signed)
SUBJECTIVE:  Intubated.  He is agitated and not appropriate.     PHYSICAL EXAM Filed Vitals:   11/08/12 0400 11/08/12 0500 11/08/12 0600 11/08/12 0700  BP: 108/71 145/84 122/75 122/72  Pulse:      Temp: 99.1 F (37.3 C) 99 F (37.2 C) 98.8 F (37.1 C) 98.6 F (37 C)  TempSrc: Core (Comment)     Resp: 14 19 20 17   Height:      Weight:      SpO2: 98% 100% 100% 100%   General:  No distress Lungs:  Clear Heart:  RRR Abdomen:  Positive bowel sounds, no rebound no guarding Extremities:  No edema Neuro:  Nonfocal.  LABS: No results found for this basename: CKTOTAL, CKMB, CKMBINDEX, TROPONINI   Results for orders placed during the hospital encounter of 10/30/12 (from the past 24 hour(s))  GLUCOSE, CAPILLARY     Status: Abnormal   Collection Time    11/07/12 11:21 AM      Result Value Range   Glucose-Capillary 173 (*) 70 - 99 mg/dL  GLUCOSE, CAPILLARY     Status: Abnormal   Collection Time    11/07/12  3:36 PM      Result Value Range   Glucose-Capillary 152 (*) 70 - 99 mg/dL  GLUCOSE, CAPILLARY     Status: Abnormal   Collection Time    11/07/12  8:06 PM      Result Value Range   Glucose-Capillary 115 (*) 70 - 99 mg/dL  GLUCOSE, CAPILLARY     Status: Abnormal   Collection Time    11/07/12 11:40 PM      Result Value Range   Glucose-Capillary 112 (*) 70 - 99 mg/dL  BASIC METABOLIC PANEL     Status: Abnormal   Collection Time    11/08/12  5:00 AM      Result Value Range   Sodium 145  135 - 145 mEq/L   Potassium 3.8  3.5 - 5.1 mEq/L   Chloride 109  96 - 112 mEq/L   CO2 29  19 - 32 mEq/L   Glucose, Bld 135 (*) 70 - 99 mg/dL   BUN 20  6 - 23 mg/dL   Creatinine, Ser 4.78  0.50 - 1.35 mg/dL   Calcium 8.9  8.4 - 29.5 mg/dL   GFR calc non Af Amer >90  >90 mL/min   GFR calc Af Amer >90  >90 mL/min  CBC WITH DIFFERENTIAL     Status: Abnormal   Collection Time    11/08/12  5:00 AM      Result Value Range   WBC 14.8 (*) 4.0 - 10.5 K/uL   RBC 3.92 (*) 4.22 - 5.81  MIL/uL   Hemoglobin 12.4 (*) 13.0 - 17.0 g/dL   HCT 62.1 (*) 30.8 - 65.7 %   MCV 93.1  78.0 - 100.0 fL   MCH 31.6  26.0 - 34.0 pg   MCHC 34.0  30.0 - 36.0 g/dL   RDW 84.6  96.2 - 95.2 %   Platelets 256  150 - 400 K/uL   Neutrophils Relative % 73  43 - 77 %   Lymphocytes Relative 14  12 - 46 %   Monocytes Relative 9  3 - 12 %   Eosinophils Relative 3  0 - 5 %   Basophils Relative 1  0 - 1 %   Neutro Abs 10.9 (*) 1.7 - 7.7 K/uL   Lymphs Abs 2.1  0.7 - 4.0 K/uL  Monocytes Absolute 1.3 (*) 0.1 - 1.0 K/uL   Eosinophils Absolute 0.4  0.0 - 0.7 K/uL   Basophils Absolute 0.1  0.0 - 0.1 K/uL   WBC Morphology TOXIC GRANULATION    TRIGLYCERIDES     Status: None   Collection Time    11/08/12  5:00 AM      Result Value Range   Triglycerides 129  <150 mg/dL  GLUCOSE, CAPILLARY     Status: Abnormal   Collection Time    11/08/12  5:32 AM      Result Value Range   Glucose-Capillary 129 (*) 70 - 99 mg/dL    Intake/Output Summary (Last 24 hours) at 11/08/12 9147 Last data filed at 11/08/12 0700  Gross per 24 hour  Intake 2745.43 ml  Output   3040 ml  Net -294.57 ml    ASSESSMENT AND PLAN:  CAD:  Continue current therapy  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  BP is somewhat labile.  As sedation is off we might be able to titrate ACE further.    VT:  No recurrent VT and he has actually been off of the amiodarone for several days.  I will leave him off of this.     Rollene Rotunda 11/08/2012 8:32 AM

## 2012-11-09 ENCOUNTER — Inpatient Hospital Stay (HOSPITAL_COMMUNITY): Payer: BC Managed Care – PPO

## 2012-11-09 LAB — POCT I-STAT 3, ART BLOOD GAS (G3+)
Acid-Base Excess: 5 mmol/L — ABNORMAL HIGH (ref 0.0–2.0)
Bicarbonate: 28.9 mEq/L — ABNORMAL HIGH (ref 20.0–24.0)
O2 Saturation: 97 %
Patient temperature: 99
TCO2: 30 mmol/L (ref 0–100)
pO2, Arterial: 83 mmHg (ref 80.0–100.0)

## 2012-11-09 LAB — GLUCOSE, CAPILLARY
Glucose-Capillary: 126 mg/dL — ABNORMAL HIGH (ref 70–99)
Glucose-Capillary: 136 mg/dL — ABNORMAL HIGH (ref 70–99)
Glucose-Capillary: 177 mg/dL — ABNORMAL HIGH (ref 70–99)

## 2012-11-09 LAB — BASIC METABOLIC PANEL
BUN: 21 mg/dL (ref 6–23)
CO2: 28 mEq/L (ref 19–32)
Calcium: 8.6 mg/dL (ref 8.4–10.5)
Creatinine, Ser: 0.67 mg/dL (ref 0.50–1.35)
GFR calc non Af Amer: >90 mL/min (ref >90)
Glucose, Bld: 113 mg/dL — ABNORMAL HIGH (ref 70–99)
Sodium: 144 mEq/L (ref 135–145)

## 2012-11-09 LAB — CBC
MCH: 31.4 pg (ref 26.0–34.0)
MCHC: 33.7 g/dL (ref 30.0–36.0)
MCV: 93.4 fL (ref 78.0–100.0)
Platelets: 345 10*3/uL (ref 150–400)
RBC: 4.07 MIL/uL — ABNORMAL LOW (ref 4.22–5.81)

## 2012-11-09 MED ORDER — FUROSEMIDE 10 MG/ML IJ SOLN
40.0000 mg | Freq: Two times a day (BID) | INTRAMUSCULAR | Status: AC
  Administered 2012-11-09 – 2012-11-10 (×2): 40 mg via INTRAVENOUS
  Filled 2012-11-09 (×2): qty 4

## 2012-11-09 MED ORDER — POTASSIUM CHLORIDE 20 MEQ/15ML (10%) PO LIQD
20.0000 meq | Freq: Two times a day (BID) | ORAL | Status: AC
  Administered 2012-11-09 (×2): 20 meq
  Filled 2012-11-09 (×2): qty 15

## 2012-11-09 MED ORDER — SODIUM CHLORIDE 0.9 % IJ SOLN
10.0000 mL | Freq: Two times a day (BID) | INTRAMUSCULAR | Status: DC
  Administered 2012-11-09: 10 mL via INTRAVENOUS

## 2012-11-09 MED ORDER — SODIUM CHLORIDE 0.9 % IV SOLN
250.0000 mg | Freq: Two times a day (BID) | INTRAVENOUS | Status: DC
  Administered 2012-11-09 – 2012-11-10 (×3): 250 mg via INTRAVENOUS
  Filled 2012-11-09 (×5): qty 2.5

## 2012-11-09 NOTE — Progress Notes (Signed)
SUBJECTIVE:  Intubated.  He is agitated opens eyes and follows voice.     PHYSICAL EXAM Filed Vitals:   11/09/12 0400 11/09/12 0405 11/09/12 0500 11/09/12 0600  BP: 121/70  122/71 114/66  Pulse:  87    Temp: 99.1 F (37.3 C) 99.1 F (37.3 C) 99.7 F (37.6 C) 99 F (37.2 C)  TempSrc: Core (Comment)     Resp: 17 20 18 17   Height:      Weight:      SpO2: 99% 99% 96% 98%   General:  No distress Lungs:  Clear Heart:  RRR Abdomen:  Positive bowel sounds, no rebound no guarding Extremities:  No edema Neuro:  Nonfocal.  LABS: No results found for this basename: CKTOTAL,  CKMB,  CKMBINDEX,  TROPONINI   Results for orders placed during the hospital encounter of 10/30/12 (from the past 24 hour(s))  GLUCOSE, CAPILLARY     Status: Abnormal   Collection Time    11/08/12 12:20 PM      Result Value Range   Glucose-Capillary 162 (*) 70 - 99 mg/dL  GLUCOSE, CAPILLARY     Status: Abnormal   Collection Time    11/08/12  4:23 PM      Result Value Range   Glucose-Capillary 112 (*) 70 - 99 mg/dL  GLUCOSE, CAPILLARY     Status: Abnormal   Collection Time    11/08/12  8:07 PM      Result Value Range   Glucose-Capillary 104 (*) 70 - 99 mg/dL  GLUCOSE, CAPILLARY     Status: Abnormal   Collection Time    11/09/12 12:20 AM      Result Value Range   Glucose-Capillary 136 (*) 70 - 99 mg/dL  BASIC METABOLIC PANEL     Status: Abnormal   Collection Time    11/09/12  5:00 AM      Result Value Range   Sodium 144  135 - 145 mEq/L   Potassium 4.0  3.5 - 5.1 mEq/L   Chloride 107  96 - 112 mEq/L   CO2 28  19 - 32 mEq/L   Glucose, Bld 113 (*) 70 - 99 mg/dL   BUN 21  6 - 23 mg/dL   Creatinine, Ser 1.61  0.50 - 1.35 mg/dL   Calcium 8.6  8.4 - 09.6 mg/dL   GFR calc non Af Amer >90  >90 mL/min   GFR calc Af Amer >90  >90 mL/min  CBC     Status: Abnormal   Collection Time    11/09/12  5:00 AM      Result Value Range   WBC 17.0 (*) 4.0 - 10.5 K/uL   RBC 4.07 (*) 4.22 - 5.81 MIL/uL   Hemoglobin 12.8 (*) 13.0 - 17.0 g/dL   HCT 04.5 (*) 40.9 - 81.1 %   MCV 93.4  78.0 - 100.0 fL   MCH 31.4  26.0 - 34.0 pg   MCHC 33.7  30.0 - 36.0 g/dL   RDW 91.4  78.2 - 95.6 %   Platelets 345  150 - 400 K/uL  MAGNESIUM     Status: None   Collection Time    11/09/12  5:00 AM      Result Value Range   Magnesium 2.3  1.5 - 2.5 mg/dL  GLUCOSE, CAPILLARY     Status: Abnormal   Collection Time    11/09/12  5:02 AM      Result Value Range   Glucose-Capillary 116 (*) 70 -  99 mg/dL    Intake/Output Summary (Last 24 hours) at 11/09/12 0801 Last data filed at 11/09/12 0600  Gross per 24 hour  Intake 2621.7 ml  Output   4215 ml  Net -1593.3 ml    ASSESSMENT AND PLAN:  CAD:  Continue current therapy  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  BP is soft while sedated.  Tolerating current meds .  No titration today.  Good UO.  Continue IV Lasix.   VT:  No recurrent VT and he has actually been off of the amiodarone for several days.  I will leave him off of this.     Fayrene Fearing [facility] 11/09/2012 8:01 AM

## 2012-11-09 NOTE — Progress Notes (Signed)
Increased PS to 10 d/t pt w/ increased RR and aggitation.

## 2012-11-09 NOTE — Progress Notes (Signed)
NEURO HOSPITALIST PROGRESS NOTE   SUBJECTIVE:                                                                                                                        Intubated. Still agitated requiring propofol and fentanyl. No paroxysmal episodes to suggest seizures. On keppra and dilantin.  OBJECTIVE:                                                                                                                           Vital signs in last 24 hours: Temp:  [98.4 F (36.9 C)-99.7 F (37.6 C)] 98.8 F (37.1 C) (06/08 0750) Pulse Rate:  [81-97] 94 (06/08 0750) Resp:  [15-25] 19 (06/08 0750) BP: (95-136)/(59-74) 104/67 mmHg (06/08 0750) SpO2:  [96 %-100 %] 100 % (06/08 0750) FiO2 (%):  [29.4 %-30.8 %] 30 % (06/08 0800)  Intake/Output from previous day: 06/07 0701 - 06/08 0700 In: 2718.1 [I.V.:1153.1; NG/GT:1355; IV Piggyback:210] Out: 4315 [Urine:3865; Stool:450] Intake/Output this shift: Total I/O In: 45 [NG/GT:45] Out: -  Nutritional status:    History reviewed. No pertinent past medical history.    Neurologic Exam:  Patient is on propofol. Mental status: he open eyes upon verbal commands but doesn't follow commands. CN 2-12: pupils 2-3 mm reactive. No gaze preference. Resist eye examination but are EOM full without nystagmus. No frank facial asymmetry.   Motor: seems to move all limbs symmetrically. Sensory: reacts to noxious stimuli even while on propofol. DTR's: no tested. Coordination and gait: unable to test.  Lab Results: No results found for this basename: cbc, bmp, coags, chol, tri, ldl, hga1c   Lipid Panel  Recent Labs  11/08/12 0500  TRIG 129    Studies/Results: Dg Chest Port 1 View  11/09/2012   *RADIOLOGY REPORT*  Clinical Data: Endotracheal tube, assess effusions and left lower lobe airspace opacity  PORTABLE CHEST - 1 VIEW  Comparison: Prior chest x-ray six in 2014  Findings: To the endotracheal tube is 4.4  cm above the carina.  The left IJ approach central venous catheter is in stable position with the tip in the mid SVC.  Nasogastric tube is coiled within the stomach.  Improving left basilar opacity with some persistent opacity in the left  retrocardiac region.  Cardiac and mediastinal contours are unchanged. No definite pleural effusion on today's exam.  The right lung is relatively clear.  No acute osseous abnormality.  IMPRESSION:  1.  Improving left basilar opacity with some residual retrocardiac density which may reflect atelectasis or infiltrate. 2.  No definite effusion. 3.  Stable and satisfactory support apparatus.   Original Report Authenticated By: Malachy Moan, M.D.   Dg Chest Port 1 View  11/08/2012   *RADIOLOGY REPORT*  Clinical Data: Assess left lower lobe infiltrate  PORTABLE CHEST - 1 VIEW  Comparison: Prior chest x-rays 11/07/2012  Findings: The tip of the endotracheal tube is 4 cm above the carina.  Left IJ approach central venous catheter in unchanged position with the tip in the mid superior vena cava.  The nasogastric tube tip is coiled within the gastric fundus. Increasing left lower lobe opacity.  There may be a posteriorly layering pleural effusion.  The right lung remains clear.  No pneumothorax or acute osseous abnormality.  IMPRESSION:  1.  Increasing left lower lobe opacity.  There may be a posteriorly layering pleural effusion with associated atelectasis.  Developing infiltrate is also a possibility.  2.  Stable and satisfactory support apparatus as above.   Original Report Authenticated By: Malachy Moan, M.D.    MEDICATIONS                                                                                                                       I have reviewed the patient's current medications.  ASSESSMENT/PLAN:                                                                                                           Post anoxic encephalopathy. Still with agitated delirium but  otherwise he seems to be gradually imroving. Plan to gradually wean off anticonvuldants. Will follow up.  Vilinda Boehringer, MD  [facility] [phone]  11/09/2012, 10:46 AM

## 2012-11-09 NOTE — Progress Notes (Signed)
PULMONARY  / CRITICAL CARE MEDICINE  Name: Walter Frazier MRN: [id number] DOB: April 16, 1956    ADMISSION DATE:  10/30/2012 CONSULTATION DATE:  10/30/2012  REFERRING MD :  Diona Browner PRIMARY SERVICE: PCCM  CHIEF COMPLAINT:  Cardiac arrest  BRIEF PATIENT DESCRIPTION: 57 y/o male smoker with no known past medical history was admitted to [facility] on 5/29 after cardiac arrest from a STEMI.  He was comatose afterwards and was started on the hypothermia protocol and transferred to [facility].  SIGNIFICANT EVENTS / STUDIES:  5/29 - LHC > DES to prox LAD @ [facility]. IABP placed 5/29 - Transfer to [facility] for hypothermia 5/30 - Echo >> EF 25%, LV severely dilated 6/02 - RN noted ?myoclonus - sedation infusions resumed. Purposeful on WUA but not F/C.  6/02 - Neuro consult: EEG > non-convulsive status epilepticus. Resolution of these discharges after ativan. Loaded with fosphenytoin.  6/02 - CT head: NAD 6/03 - 24 hr EEG:  Diffuse slowing, suggestive of general encephalopathy 6/04 - Started on Keppra 6/06 - daily progress neuro wise, severe agiation 6/07 - weaned for 1 hour on PSV, agitation limiting factor  LINES / TUBES: IABP 5/29 >> 6/01 ETT 5/29 >> L IJ CVL 5/29 >> L radial A-line 5/29 >> 6/4  CULTURES: MRSA PCR 5/29 >> neg  ANTIBIOTICS: None   SUBJ/Overnight: Weaned on PSV for 1 hour, had to go back on full support due to agitation.  On sedation, weans well on PSV 10/5, 30%  PHYSICAL EXAMINATION: Gen:  wdwn adult male, sedate on vent, no distress HEENT: ett wnl, jvd PULM:  resp's even/non-labored on vent, lungs bilaterally with scattered rhonchi CV: RRR s M AB: BS+, soft, nontender, no hsm Ext: warm/dry, trace edema Neuro: moves all ext on WUA, but no follow commands  LABS: BMET  Recent Labs Lab 11/03/12 0431  11/05/12 0500 11/06/12 0500 11/07/12 0425 11/08/12 0500 11/09/12 0500  NA 131*  < > 137 139 144 145 144  K 3.7  < > 3.2* 3.3* 3.4* 3.8 4.0  CL 98  < > 105 104 108 109 107   CO2 24  < > 24 23 27 29 28   GLUCOSE 102*  < > 157* 138* 129* 135* 113*  BUN 11  < > 16 16 14 20 21   CREATININE 0.80  < > 0.63 0.60 0.65 0.61 0.67  CALCIUM 8.4  < > 8.5 8.5 8.5 8.9 8.6  MG 2.4  --   --   --   --   --  2.3  PHOS 2.0*  --   --   --   --   --   --   < > = values in this interval not displayed.  CBC  Recent Labs Lab 11/07/12 0425 11/08/12 0500 11/09/12 0500  HGB 12.5* 12.4* 12.8*  HCT 36.2* 36.5* 38.0*  WBC 18.2* 14.8* 17.0*  PLT 199 256 345    ABG    Component Value Date/Time   PHART 7.425 11/03/2012 0645   PCO2ART 36.0 11/03/2012 0645   PO2ART 183.0* 11/03/2012 0645   HCO3 23.2 11/03/2012 0645   TCO2 24.3 11/03/2012 0645   ACIDBASEDEF 0.6 11/03/2012 0645   O2SAT 99.3 11/03/2012 0645     Recent Labs Lab 11/08/12 1220 11/08/12 1623 11/08/12 2007 11/09/12 0020 11/09/12 0502  GLUCAP 162* 112* 104* 136* 116*    CXR: mild left hazziness   ASSESSMENT / PLAN:  CARDIOVASCULAR A:  Cardiac arrest - due to recurrent VT STEMI - AWMI Prox LAD  occlusion - s/p DES PCI @ [facility] Cardiogenic shock - resolved.   HTN  P:  -continue PT lopressor -ACEI   PULMONARY A:  Acute respiratory failure in the setting of cardiac arrest LLL Airspace Disease - increased LLL, may be component effusion  P:   -daily PSV / WUA -follow LLL on cxr -family did not want trach initially, but may revisit with neuro progress -lasix as renal fxn / bp permit -allow PSV wean while on sedation as tolerated during day (6/8), full support overnight   NEUROLOGIC A:   Post anoxic encephalopathy - improvement 6/5-6.  No follow commands as of 6/8 Status epilepticus   P:   -Anticonvulsants per Neuro -likely will require trach  RENAL A: Hyponatremia - mild  Hypokalemia P:   -Correct electrolytes, K supp -chem in am -lasix  GASTROINTESTINAL A:  Tolerating TF P:   -Cont PPI -Cont TFs   HEMATOLOGIC A:   DVT prevention mild leukocytosis P:  - f/u cbc, wbc in am  - cont  lovenox  INFECTIOUS Leukocytosis - afebrile, diff reassuring P:   -holding off on abx for now, follow radiographically and clinically -will need to d/c lines & change sites    ENDOCRINE A:  Glu controlled P:   SSI  Code status> Full  CC Time: 30 minutes  Levy Pupa, MD, PhD 11/09/2012, 4:28 PM [facility] [phone] or if no answer [phone]

## 2012-11-10 ENCOUNTER — Inpatient Hospital Stay (HOSPITAL_COMMUNITY): Payer: BC Managed Care – PPO

## 2012-11-10 LAB — CBC
HCT: 38.1 % — ABNORMAL LOW (ref 39.0–52.0)
Hemoglobin: 13.2 g/dL (ref 13.0–17.0)
MCHC: 34.6 g/dL (ref 30.0–36.0)
MCV: 92.3 fL (ref 78.0–100.0)

## 2012-11-10 LAB — BASIC METABOLIC PANEL
BUN: 27 mg/dL — ABNORMAL HIGH (ref 6–23)
CO2: 30 mEq/L (ref 19–32)
Chloride: 104 mEq/L (ref 96–112)
Creatinine, Ser: 0.71 mg/dL (ref 0.50–1.35)
GFR calc Af Amer: >90 mL/min (ref >90)
Potassium: 4.1 mEq/L (ref 3.5–5.1)

## 2012-11-10 LAB — GLUCOSE, CAPILLARY
Glucose-Capillary: 118 mg/dL — ABNORMAL HIGH (ref 70–99)
Glucose-Capillary: 127 mg/dL — ABNORMAL HIGH (ref 70–99)
Glucose-Capillary: 136 mg/dL — ABNORMAL HIGH (ref 70–99)
Glucose-Capillary: 153 mg/dL — ABNORMAL HIGH (ref 70–99)

## 2012-11-10 LAB — PHENYTOIN LEVEL, TOTAL: Phenytoin Lvl: 8.4 ug/mL — ABNORMAL LOW (ref 10.0–20.0)

## 2012-11-10 MED ORDER — DEXMEDETOMIDINE BOLUS VIA INFUSION
1.0000 ug/kg | Freq: Once | INTRAVENOUS | Status: AC
  Administered 2012-11-10: 74.7 ug via INTRAVENOUS

## 2012-11-10 MED ORDER — MIDAZOLAM BOLUS VIA INFUSION
1.0000 mg | INTRAVENOUS | Status: DC | PRN
  Filled 2012-11-10: qty 2

## 2012-11-10 MED ORDER — METOPROLOL TARTRATE 1 MG/ML IV SOLN
2.5000 mg | INTRAVENOUS | Status: DC | PRN
  Filled 2012-11-10: qty 5

## 2012-11-10 MED ORDER — LEVALBUTEROL HCL 0.63 MG/3ML IN NEBU
0.6300 mg | INHALATION_SOLUTION | RESPIRATORY_TRACT | Status: DC | PRN
  Filled 2012-11-10: qty 3

## 2012-11-10 MED ORDER — KCL IN DEXTROSE-NACL 20-5-0.45 MEQ/L-%-% IV SOLN
INTRAVENOUS | Status: DC
  Administered 2012-11-10 – 2012-11-11 (×2): via INTRAVENOUS
  Filled 2012-11-10 (×2): qty 1000

## 2012-11-10 MED ORDER — ATORVASTATIN CALCIUM 80 MG PO TABS
80.0000 mg | ORAL_TABLET | Freq: Every day | ORAL | Status: DC
Start: 2012-11-10 — End: 2012-11-25
  Administered 2012-11-10 – 2012-11-25 (×14): 80 mg via ORAL
  Filled 2012-11-10 (×16): qty 1

## 2012-11-10 MED ORDER — LEVALBUTEROL HCL 0.63 MG/3ML IN NEBU
0.6300 mg | INHALATION_SOLUTION | Freq: Four times a day (QID) | RESPIRATORY_TRACT | Status: DC | PRN
  Administered 2012-11-10 – 2012-11-11 (×2): 0.63 mg via RESPIRATORY_TRACT
  Filled 2012-11-10: qty 3

## 2012-11-10 MED ORDER — PANTOPRAZOLE SODIUM 40 MG IV SOLR
40.0000 mg | INTRAVENOUS | Status: DC
  Administered 2012-11-10: 40 mg via INTRAVENOUS
  Filled 2012-11-10 (×2): qty 40

## 2012-11-10 MED ORDER — SODIUM CHLORIDE 0.9 % IV SOLN
1.0000 mg/h | INTRAVENOUS | Status: DC
  Filled 2012-11-10: qty 10

## 2012-11-10 MED ORDER — FENTANYL CITRATE 0.05 MG/ML IJ SOLN: 200.0000 ug | Freq: Once | INTRAMUSCULAR | Status: DC

## 2012-11-10 MED ORDER — IPRATROPIUM BROMIDE 0.02 % IN SOLN
0.5000 mg | Freq: Four times a day (QID) | RESPIRATORY_TRACT | Status: DC
  Administered 2012-11-10 – 2012-11-15 (×19): 0.5 mg via RESPIRATORY_TRACT
  Filled 2012-11-10 (×20): qty 2.5

## 2012-11-10 MED ORDER — FENTANYL BOLUS VIA INFUSION
25.0000 ug | Freq: Four times a day (QID) | INTRAVENOUS | Status: DC | PRN
  Administered 2012-11-11: 50 ug via INTRAVENOUS
  Filled 2012-11-10: qty 100

## 2012-11-10 MED ORDER — HYDRALAZINE HCL 20 MG/ML IJ SOLN: 10.0000 mg | INTRAMUSCULAR | Status: DC | PRN

## 2012-11-10 MED ORDER — ACETAMINOPHEN 650 MG RE SUPP: 650.0000 mg | RECTAL | Status: DC | PRN

## 2012-11-10 MED ORDER — SODIUM CHLORIDE 0.9 % IV SOLN
INTRAVENOUS | Status: DC
  Administered 2012-11-11: 19:00:00 via INTRAVENOUS

## 2012-11-10 MED ORDER — ENALAPRILAT 1.25 MG/ML IV SOLN
1.2500 mg | Freq: Four times a day (QID) | INTRAVENOUS | Status: DC
  Administered 2012-11-10 – 2012-11-11 (×4): 1.25 mg via INTRAVENOUS
  Filled 2012-11-10 (×8): qty 1

## 2012-11-10 MED ORDER — ACETAMINOPHEN 325 MG PO TABS
650.0000 mg | ORAL_TABLET | ORAL | Status: DC | PRN
  Administered 2012-11-11 – 2012-11-13 (×3): 650 mg via NASOGASTRIC
  Filled 2012-11-10 (×3): qty 2

## 2012-11-10 MED ORDER — METOPROLOL TARTRATE 1 MG/ML IV SOLN
5.0000 mg | Freq: Four times a day (QID) | INTRAVENOUS | Status: DC
  Administered 2012-11-10 – 2012-11-11 (×5): 5 mg via INTRAVENOUS
  Filled 2012-11-10 (×7): qty 5

## 2012-11-10 MED ORDER — CLOPIDOGREL BISULFATE 75 MG PO TABS
75.0000 mg | ORAL_TABLET | Freq: Every day | ORAL | Status: DC
  Filled 2012-11-10: qty 1

## 2012-11-10 MED ORDER — ASPIRIN 300 MG RE SUPP
300.0000 mg | Freq: Every day | RECTAL | Status: DC
  Filled 2012-11-10: qty 1

## 2012-11-10 MED ORDER — MIDAZOLAM HCL 2 MG/2ML IJ SOLN
INTRAMUSCULAR | Status: AC
  Administered 2012-11-10: 2 mg
  Filled 2012-11-10: qty 2

## 2012-11-10 MED ORDER — ETOMIDATE 2 MG/ML IV SOLN
INTRAVENOUS | Status: AC
  Administered 2012-11-10: 20 mg
  Filled 2012-11-10: qty 20

## 2012-11-10 MED ORDER — SUCCINYLCHOLINE CHLORIDE 20 MG/ML IJ SOLN
INTRAMUSCULAR | Status: AC
  Filled 2012-11-10: qty 1

## 2012-11-10 MED ORDER — SODIUM CHLORIDE 0.9 % IV SOLN
25.0000 ug/h | INTRAVENOUS | Status: AC
  Administered 2012-11-10: 50 ug/h via INTRAVENOUS
  Administered 2012-11-10: 25 ug/h via INTRAVENOUS
  Administered 2012-11-10 (×2): 50 ug/h via INTRAVENOUS
  Administered 2012-11-11: 20 ug/h via INTRAVENOUS
  Filled 2012-11-10: qty 50

## 2012-11-10 MED ORDER — HALOPERIDOL LACTATE 5 MG/ML IJ SOLN
INTRAMUSCULAR | Status: AC
  Administered 2012-11-10: 2 mg
  Filled 2012-11-10: qty 1

## 2012-11-10 MED ORDER — ETOMIDATE 2 MG/ML IV SOLN
40.0000 mg | Freq: Once | INTRAVENOUS | Status: DC
  Filled 2012-11-10: qty 20

## 2012-11-10 MED ORDER — LEVALBUTEROL HCL 0.63 MG/3ML IN NEBU
0.6300 mg | INHALATION_SOLUTION | Freq: Four times a day (QID) | RESPIRATORY_TRACT | Status: DC
  Administered 2012-11-10 – 2012-11-15 (×18): 0.63 mg via RESPIRATORY_TRACT
  Filled 2012-11-10 (×34): qty 3

## 2012-11-10 MED ORDER — FENTANYL CITRATE 0.05 MG/ML IJ SOLN
25.0000 ug | INTRAMUSCULAR | Status: DC | PRN
  Administered 2012-11-10: 50 ug via INTRAVENOUS

## 2012-11-10 MED ORDER — DEXMEDETOMIDINE HCL IN NACL 200 MCG/50ML IV SOLN
0.2000 ug/kg/h | INTRAVENOUS | Status: AC
  Administered 2012-11-10: 1.2 ug/kg/h via INTRAVENOUS
  Administered 2012-11-10: 0.5 ug/kg/h via INTRAVENOUS
  Filled 2012-11-10 (×2): qty 50

## 2012-11-10 MED ORDER — MIDAZOLAM HCL 2 MG/2ML IJ SOLN: 4.0000 mg | Freq: Once | INTRAMUSCULAR | Status: DC

## 2012-11-10 MED ORDER — BUDESONIDE 0.25 MG/2ML IN SUSP
0.2500 mg | Freq: Four times a day (QID) | RESPIRATORY_TRACT | Status: DC
  Administered 2012-11-10 – 2012-11-15 (×18): 0.25 mg via RESPIRATORY_TRACT
  Filled 2012-11-10 (×26): qty 2

## 2012-11-10 MED ORDER — PROPOFOL 10 MG/ML IV EMUL
INTRAVENOUS | Status: AC
  Administered 2012-11-10: 1000 mg
  Filled 2012-11-10: qty 100

## 2012-11-10 MED ORDER — FENTANYL CITRATE 0.05 MG/ML IJ SOLN
50.0000 ug | Freq: Once | INTRAMUSCULAR | Status: AC
  Administered 2012-11-10: 50 ug via INTRAVENOUS

## 2012-11-10 MED ORDER — VECURONIUM BROMIDE 10 MG IV SOLR
10.0000 mg | Freq: Once | INTRAVENOUS | Status: DC
  Filled 2012-11-10: qty 10

## 2012-11-10 MED ORDER — PROPOFOL 10 MG/ML IV EMUL
5.0000 ug/kg/min | INTRAVENOUS | Status: DC
  Administered 2012-11-10: 10 ug/kg/min via INTRAVENOUS
  Administered 2012-11-11: 50 ug/kg/min via INTRAVENOUS
  Administered 2012-11-11: 60 ug/kg/min via INTRAVENOUS
  Administered 2012-11-11: 30 ug/kg/min via INTRAVENOUS
  Administered 2012-11-11: 50 ug/kg/min via INTRAVENOUS
  Administered 2012-11-11 (×2): 40 ug/kg/min via INTRAVENOUS
  Administered 2012-11-12 (×2): 60 ug/kg/min via INTRAVENOUS
  Administered 2012-11-12: 20 ug/kg/min via INTRAVENOUS
  Filled 2012-11-10 (×10): qty 100

## 2012-11-10 MED ORDER — SODIUM CHLORIDE 0.9 % IV SOLN
125.0000 mg | Freq: Three times a day (TID) | INTRAVENOUS | Status: DC
  Administered 2012-11-10 – 2012-11-11 (×3): 125 mg via INTRAVENOUS
  Filled 2012-11-10 (×7): qty 2.5

## 2012-11-10 MED ORDER — ROCURONIUM BROMIDE 50 MG/5ML IV SOLN
INTRAVENOUS | Status: AC
  Administered 2012-11-10: 30 mg
  Filled 2012-11-10: qty 2

## 2012-11-10 MED ORDER — CLONIDINE HCL 0.1 MG/24HR TD PTWK
0.1000 mg | MEDICATED_PATCH | TRANSDERMAL | Status: DC
  Administered 2012-11-10: 0.1 mg via TRANSDERMAL
  Filled 2012-11-10: qty 1

## 2012-11-10 MED ORDER — LIDOCAINE HCL (CARDIAC) 20 MG/ML IV SOLN
INTRAVENOUS | Status: AC
  Filled 2012-11-10: qty 5

## 2012-11-10 NOTE — Procedures (Signed)
Intubation Procedure Note UBALDO DAYWALT [id number] 02/08/1956  Procedure: Intubation Indications: Airway protection and maintenance  Procedure Details Consent: Risks of procedure as well as the alternatives and risks of each were explained to the (patient/caregiver).  Consent for procedure obtained. Time Out: Verified patient identification, verified procedure, site/side was marked, verified correct patient position, special equipment/implants available, medications/allergies/relevent history reviewed, required imaging and test results available.  Performed  Maximum sterile technique was used including gloves, hand hygiene and mask.  MAC and 4    Evaluation Hemodynamic Status: BP stable throughout; O2 sats: stable throughout Patient's Current Condition: stable Complications: No apparent complications Patient did tolerate procedure well. Chest X-ray ordered to verify placement.  CXR: tube position acceptable.   Sharene Skeans [facility] 11/10/2012

## 2012-11-10 NOTE — Progress Notes (Signed)
Pt extubated to 4l . Pt tolerating well. No complications at this time. Vital signs stable. RT will continue to monitor.

## 2012-11-10 NOTE — Progress Notes (Signed)
Pt extubated this AM. Began to get agitated, increased RR and HR. MD and respiratory notified. Both came to bedside and assessed. Performed multiple interventions per MD. MD decided to re-intubate. Restarted on propofol and Fentanyl. Will continue to monitor.   Charlane Ferretti, RN

## 2012-11-10 NOTE — Progress Notes (Signed)
Subjective: INtubated Objective: Filed Vitals:   11/10/12 0400 11/10/12 0423 11/10/12 0500 11/10/12 0600  BP: 104/66  111/73 111/74  Pulse:  87    Temp: 99.3 F (37.4 C) 99.3 F (37.4 C) 99.7 F (37.6 C) 99 F (37.2 C)  TempSrc:      Resp: 19 26 19 18   Height:      Weight: 164 lb 10.9 oz (74.7 kg)     SpO2: 97%      Weight change:   Intake/Output Summary (Last 24 hours) at 11/10/12 0743 Last data filed at 11/10/12 0600  Gross per 24 hour  Intake 2251.33 ml  Output   3415 ml  Net -1163.67 ml    General: Patient intubated  Eyes open  No purposeful gaze Neck:  JVP is normal Heart: Regular rate and rhythm, without murmurs, rubs, gallops.  Lungs: Clear to auscultation.  No rales or wheezes. Exemities:  No edema.    Tele:  SR  Lab Results: Results for orders placed during the hospital encounter of 10/30/12 (from the past 24 hour(s))  GLUCOSE, CAPILLARY     Status: Abnormal   Collection Time    11/09/12 12:08 PM      Result Value Range   Glucose-Capillary 177 (*) 70 - 99 mg/dL  POCT I-STAT 3, BLOOD GAS (G3+)     Status: Abnormal   Collection Time    11/09/12 12:51 PM      Result Value Range   pH, Arterial 7.458 (*) 7.350 - 7.450   pCO2 arterial 41.0  35.0 - 45.0 mmHg   pO2, Arterial 83.0  80.0 - 100.0 mmHg   Bicarbonate 28.9 (*) 20.0 - 24.0 mEq/L   TCO2 30  0 - 100 mmol/L   O2 Saturation 97.0     Acid-Base Excess 5.0 (*) 0.0 - 2.0 mmol/L   Patient temperature 99.0 F     Collection site RADIAL, ALLEN'S TEST ACCEPTABLE     Drawn by Operator     Sample type ARTERIAL    GLUCOSE, CAPILLARY     Status: Abnormal   Collection Time    11/09/12  3:42 PM      Result Value Range   Glucose-Capillary 141 (*) 70 - 99 mg/dL  GLUCOSE, CAPILLARY     Status: Abnormal   Collection Time    11/09/12  7:35 PM      Result Value Range   Glucose-Capillary 126 (*) 70 - 99 mg/dL  GLUCOSE, CAPILLARY     Status: Abnormal   Collection Time    11/09/12 11:35 PM      Result Value Range    Glucose-Capillary 146 (*) 70 - 99 mg/dL  GLUCOSE, CAPILLARY     Status: Abnormal   Collection Time    11/10/12  3:55 AM      Result Value Range   Glucose-Capillary 153 (*) 70 - 99 mg/dL  BASIC METABOLIC PANEL     Status: Abnormal   Collection Time    11/10/12  4:00 AM      Result Value Range   Sodium 141  135 - 145 mEq/L   Potassium 4.1  3.5 - 5.1 mEq/L   Chloride 104  96 - 112 mEq/L   CO2 30  19 - 32 mEq/L   Glucose, Bld 162 (*) 70 - 99 mg/dL   BUN 27 (*) 6 - 23 mg/dL   Creatinine, Ser 9.14  0.50 - 1.35 mg/dL   Calcium 9.0  8.4 - 78.2 mg/dL  GFR calc non Af Amer >90  >90 mL/min   GFR calc Af Amer >90  >90 mL/min  CBC     Status: Abnormal   Collection Time    11/10/12  4:00 AM      Result Value Range   WBC 15.7 (*) 4.0 - 10.5 K/uL   RBC 4.13 (*) 4.22 - 5.81 MIL/uL   Hemoglobin 13.2  13.0 - 17.0 g/dL   HCT 40.9 (*) 81.1 - 91.4 %   MCV 92.3  78.0 - 100.0 fL   MCH 32.0  26.0 - 34.0 pg   MCHC 34.6  30.0 - 36.0 g/dL   RDW 78.2  95.6 - 21.3 %   Platelets 424 (*) 150 - 400 K/uL  MAGNESIUM     Status: None   Collection Time    11/10/12  4:00 AM      Result Value Range   Magnesium 2.5  1.5 - 2.5 mg/dL    Studies/Results: @RISRSLT24 @  Medications: Reviewed   @PROBHOSP @  1.  Anterior STEMI  S/p PTCA/DES to LAD  LVEF 25%  Keep on current regimen  Volume status appears OK  2.  Neuro.  Slow changes in neuro status  3.  Pulm  Remains intubated  CCM is following    LOS: 11 days   Dietrich Pates 11/10/2012, 7:43 AM

## 2012-11-10 NOTE — Progress Notes (Signed)
PULMONARY  / CRITICAL CARE MEDICINE  Name: Walter Frazier MRN: [id number] DOB: 12-01-1955    ADMISSION DATE:  10/30/2012 CONSULTATION DATE:  10/30/2012  REFERRING MD :  Diona Browner PRIMARY SERVICE: PCCM  CHIEF COMPLAINT:  Cardiac arrest  BRIEF PATIENT DESCRIPTION: 57 y/o male smoker with no known past medical history was admitted to [facility] on 5/29 after cardiac arrest from a STEMI.  He was comatose afterwards and was started on the hypothermia protocol and transferred to [facility].  SIGNIFICANT EVENTS / STUDIES:  5/29 - LHC > DES to prox LAD @ [facility]. IABP placed 5/29 - Transfer to [facility] for hypothermia 5/30 - Echo >> EF 25%, LV severely dilated 6/02 - RN noted ?myoclonus - sedation infusions resumed. Purposeful on WUA but not F/C.  6/02 - Neuro consult: EEG > non-convulsive status epilepticus. Resolution of these discharges after ativan. Loaded with fosphenytoin.  6/02 - CT head: NAD 6/03 - 24 hr EEG:  Diffuse slowing, suggestive of general encephalopathy 6/04 - Started on Keppra 6/06 - daily progress neuro wise, severe agiation 6/07 - weaned for 1 hour on PSV, agitation limiting factor 6/09 Passed SBT, extubated, tolerated approx 3 hrs. Very agitated, tachypneic despite dexmedetomidine. Reintubated. Trach planned   LINES / TUBES: IABP 5/29 >> 6/01 ETT 5/29 >> 6/09, 6/09 >> L IJ CVL 5/29 >> L radial A-line 5/29 >> 6/4  CULTURES: MRSA PCR 5/29 >> neg  ANTIBIOTICS: None   SUBJ/Overnight: Passed SBT. Extubated. Failed - likely due to neurogenic reasons. Very agitated. Occasionally regards others but not F/C.   PHYSICAL EXAMINATION: Gen: Intermittently severely agitated off propofol HEENT: Poor dentition, o/w WNL PULM: scattered rhonchi CV: RRR s M AB: BS+, soft, nontender, no hsm Ext: warm/dry, no edema Neuro: moves all ext with full strength, but no follow commands  LABS: BMET  Recent Labs Lab 11/06/12 0500 11/07/12 0425 11/08/12 0500 11/09/12 0500 11/10/12 0400  NA  139 144 145 144 141  K 3.3* 3.4* 3.8 4.0 4.1  CL 104 108 109 107 104  CO2 23 27 29 28 30   GLUCOSE 138* 129* 135* 113* 162*  BUN 16 14 20 21  27*  CREATININE 0.60 0.65 0.61 0.67 0.71  CALCIUM 8.5 8.5 8.9 8.6 9.0  MG  --   --   --  2.3 2.5    CBC  Recent Labs Lab 11/08/12 0500 11/09/12 0500 11/10/12 0400  HGB 12.4* 12.8* 13.2  HCT 36.5* 38.0* 38.1*  WBC 14.8* 17.0* 15.7*  PLT 256 345 424*    ABG    Component Value Date/Time   PHART 7.458* 11/09/2012 1251   PCO2ART 41.0 11/09/2012 1251   PO2ART 83.0 11/09/2012 1251   HCO3 28.9* 11/09/2012 1251   TCO2 30 11/09/2012 1251   ACIDBASEDEF 0.6 11/03/2012 0645   O2SAT 97.0 11/09/2012 1251     Recent Labs Lab 11/09/12 1542 11/09/12 1935 11/09/12 2335 11/10/12 0355 11/10/12 0744  GLUCAP 141* 126* 146* 153* 136*    CXR: NSC mild left hazziness   ASSESSMENT / PLAN:  CARDIOVASCULAR A:  Cardiac arrest - due to recurrent VT STEMI - AWMI Prox LAD occlusion - s/p DES PCI @ [facility] Cardiogenic shock - resolved.   HTN  P:  -continue current Rx -Cards following  PULMONARY A:  Acute respiratory failure in the setting of cardiac arrest LLL Airspace Disease Failed extubation 6/09  P:   -trach planned 6/10   NEUROLOGIC A:   Post anoxic encephalopathy - severe. Prognosis for good recovery guarded Status  epilepticus, resolved Severe agitation  P:   -Cont anticonvulsants per Neuro -Resume propofol and fent gtt with re-intubation -After trach placement, will need to work on regimen that let's him "come up" slowly  RENAL A: Hyponatremia, resolved Hypokalemia, resolved P:   -Correct electrolytes as indicated   GASTROINTESTINAL A:  Tolerating TF P:   -Cont PPI -Hold TFs for trach 6/10   HEMATOLOGIC A:   DVT prevention P:  - Cont same   INFECTIOUS Leukocytosis - afebrile, diff reassuring P:   -monitor off abx for now   75 mins CCM   Billy Fischer, MD ; [facility] service Mobile [phone].  After 5:30 PM  or weekends, call [phone]

## 2012-11-10 NOTE — Progress Notes (Signed)
NG tube advanced- per radiology CXR reading.. Dr. Molli Knock notified of advancement  of NG tube.

## 2012-11-10 NOTE — Procedures (Signed)
Oral Intubation Procedure Note   Procedure: Intubation Indications: Respiratory insufficiency, failed extubation Consent: Unable to obtain consent because of altered level of consciousness. Time Out: Verified patient identification, verified procedure, site/side was marked, verified correct patient position, special equipment/implants available, medications/allergies/relevent history reviewed, required imaging and test results available.   Pre-meds: Etomidate 30 mg IV  Neuromuscular blockade: Rocuronium 30 mg IV  Laryngoscope: #4 MAC  Visualization: cords fully visualized  ETT: 8.0 ETT passed on first attempt and secured @ 25 cm at upper incisors  Findings: normal upper airway   Evaluation:  Hemodynamic Status: stable O2 sats: stable Patient's Current Condition: stabilized Complications: none Patient did tolerate procedure well. CXR: proper position   Billy Fischer, MD ; [facility] [phone].  After 5:30 PM or weekends, call [phone]

## 2012-11-11 ENCOUNTER — Inpatient Hospital Stay (HOSPITAL_COMMUNITY): Payer: BC Managed Care – PPO

## 2012-11-11 DIAGNOSIS — I469 Cardiac arrest, cause unspecified: Secondary | ICD-10-CM

## 2012-11-11 LAB — BASIC METABOLIC PANEL
CO2: 27 mEq/L (ref 19–32)
Calcium: 8.8 mg/dL (ref 8.4–10.5)
Chloride: 106 mEq/L (ref 96–112)
Glucose, Bld: 138 mg/dL — ABNORMAL HIGH (ref 70–99)
Potassium: 4.2 mEq/L (ref 3.5–5.1)
Sodium: 141 mEq/L (ref 135–145)

## 2012-11-11 LAB — POCT I-STAT 3, ART BLOOD GAS (G3+)
TCO2: 29 mmol/L (ref 0–100)
pCO2 arterial: 41.1 mmHg (ref 35.0–45.0)
pH, Arterial: 7.437 (ref 7.350–7.450)

## 2012-11-11 LAB — PROTIME-INR
INR: 1.05 (ref 0.00–1.49)
Prothrombin Time: 13.6 seconds (ref 11.6–15.2)

## 2012-11-11 LAB — CBC
Hemoglobin: 12.4 g/dL — ABNORMAL LOW (ref 13.0–17.0)
Platelets: 455 10*3/uL — ABNORMAL HIGH (ref 150–400)
RBC: 3.94 MIL/uL — ABNORMAL LOW (ref 4.22–5.81)
WBC: 16.8 10*3/uL — ABNORMAL HIGH (ref 4.0–10.5)

## 2012-11-11 LAB — APTT: aPTT: 28 seconds (ref 24–37)

## 2012-11-11 MED ORDER — PANTOPRAZOLE SODIUM 40 MG PO PACK
40.0000 mg | PACK | Freq: Every day | ORAL | Status: DC
  Administered 2012-11-11 – 2012-11-15 (×5): 40 mg
  Filled 2012-11-11 (×5): qty 20

## 2012-11-11 MED ORDER — SODIUM CHLORIDE 0.9 % IJ SOLN
10.0000 mL | Freq: Two times a day (BID) | INTRAMUSCULAR | Status: DC
  Administered 2012-11-12 – 2012-11-18 (×11): 10 mL
  Administered 2012-11-18 – 2012-11-20 (×3): 20 mL

## 2012-11-11 MED ORDER — VECURONIUM BROMIDE 10 MG IV SOLR
10.0000 mg | Freq: Once | INTRAVENOUS | Status: AC
  Administered 2012-11-11: 10 mg via INTRAVENOUS
  Filled 2012-11-11: qty 10

## 2012-11-11 MED ORDER — JEVITY 1.2 CAL PO LIQD
1000.0000 mL | ORAL | Status: DC
  Administered 2012-11-11: 1000 mL
  Administered 2012-11-12 – 2012-11-13 (×2)
  Administered 2012-11-15 – 2012-11-19 (×4): 1000 mL
  Filled 2012-11-11 (×13): qty 1000

## 2012-11-11 MED ORDER — FENTANYL 100 MCG/HR TD PT72
100.0000 ug | MEDICATED_PATCH | TRANSDERMAL | Status: DC
  Administered 2012-11-11 – 2012-11-14 (×2): 100 ug via TRANSDERMAL
  Filled 2012-11-11 (×2): qty 2

## 2012-11-11 MED ORDER — PHENYTOIN 125 MG/5ML PO SUSP
200.0000 mg | Freq: Two times a day (BID) | ORAL | Status: DC
  Administered 2012-11-11 – 2012-11-20 (×20): 200 mg
  Filled 2012-11-11 (×25): qty 8

## 2012-11-11 MED ORDER — VALPROATE SODIUM 500 MG/5ML IV SOLN
500.0000 mg | Freq: Two times a day (BID) | INTRAVENOUS | Status: DC
  Administered 2012-11-11 – 2012-11-12 (×3): 500 mg via INTRAVENOUS
  Filled 2012-11-11 (×4): qty 5

## 2012-11-11 MED ORDER — MIDAZOLAM HCL 2 MG/2ML IJ SOLN: 4.0000 mg | Freq: Once | INTRAMUSCULAR | Status: AC

## 2012-11-11 MED ORDER — SODIUM CHLORIDE 0.9 % IJ SOLN
10.0000 mL | INTRAMUSCULAR | Status: DC | PRN
  Administered 2012-11-13 – 2012-11-14 (×2): 10 mL

## 2012-11-11 MED ORDER — VITAL AF 1.2 CAL PO LIQD
1000.0000 mL | ORAL | Status: DC
  Filled 2012-11-11 (×4): qty 1000

## 2012-11-11 MED ORDER — ASPIRIN 81 MG PO CHEW
81.0000 mg | CHEWABLE_TABLET | Freq: Every day | ORAL | Status: DC
  Administered 2012-11-11 – 2012-11-20 (×10): 81 mg
  Filled 2012-11-11 (×11): qty 1

## 2012-11-11 MED ORDER — PRO-STAT SUGAR FREE PO LIQD
30.0000 mL | Freq: Four times a day (QID) | ORAL | Status: DC
  Administered 2012-11-11 – 2012-11-17 (×23): 30 mL
  Filled 2012-11-11 (×28): qty 30

## 2012-11-11 MED ORDER — FENTANYL CITRATE 0.05 MG/ML IJ SOLN
INTRAMUSCULAR | Status: AC
  Filled 2012-11-11: qty 4

## 2012-11-11 MED ORDER — SODIUM CHLORIDE 0.9 % IV SOLN
INTRAVENOUS | Status: DC
  Administered 2012-11-15: 07:00:00 via INTRAVENOUS

## 2012-11-11 MED ORDER — METOPROLOL TARTRATE 25 MG PO TABS
25.0000 mg | ORAL_TABLET | Freq: Two times a day (BID) | ORAL | Status: DC
  Administered 2012-11-11 – 2012-11-13 (×5): 25 mg
  Filled 2012-11-11 (×7): qty 1

## 2012-11-11 MED ORDER — MIDAZOLAM HCL 2 MG/2ML IJ SOLN
INTRAMUSCULAR | Status: AC
  Administered 2012-11-11: 4 mg
  Filled 2012-11-11: qty 4

## 2012-11-11 MED ORDER — CLOPIDOGREL BISULFATE 75 MG PO TABS
75.0000 mg | ORAL_TABLET | Freq: Every day | ORAL | Status: DC
  Administered 2012-11-12 – 2012-11-20 (×9): 75 mg
  Filled 2012-11-11 (×11): qty 1

## 2012-11-11 MED ORDER — ENALAPRIL MALEATE 2.5 MG PO TABS
2.5000 mg | ORAL_TABLET | Freq: Two times a day (BID) | ORAL | Status: DC
  Administered 2012-11-11 – 2012-11-13 (×5): 2.5 mg
  Filled 2012-11-11 (×9): qty 1

## 2012-11-11 NOTE — Progress Notes (Addendum)
NEURO HOSPITALIST PROGRESS NOTE   SUBJECTIVE:                                                                                                                        He was extubated but became extremely agitated and required re intubation. On low dose propofol and fentanyl. Keppra and dilantin. No reported seizures. No new neurological developments.  OBJECTIVE:                                                                                                                           Vital signs in last 24 hours: Temp:  [98.4 F (36.9 C)-99.9 F (37.7 C)] 98.8 F (37.1 C) (06/10 0910) Pulse Rate:  [74-97] 74 (06/10 0910) Resp:  [11-42] 19 (06/10 0910) BP: (76-199)/(42-115) 99/61 mmHg (06/10 0910) SpO2:  [93 %-100 %] 100 % (06/10 0910) FiO2 (%):  [39.4 %-40.6 %] 40 % (06/10 0910)  Intake/Output from previous day: 06/09 0701 - 06/10 0700 In: 2724.1 [I.V.:2022.6; NG/GT:90; IV Piggyback:611.5] Out: 2875 [Urine:2875] Intake/Output this shift: Total I/O In: 121 [I.V.:91; NG/GT:30] Out: 100 [Urine:100] Nutritional status: NPO  History reviewed. No pertinent past medical history.   Neurologic Exam:  Intubated. MS: alert and awake, tracks me in the room but doesn't follows commands. CN 2-12: symmetric 2-3 mm pupils, reactive to light. No gaze preference. Resist eye exam. EOM intact. No facial weakness. Motor: moves all extremities spontaneously and symmetrically. Sensory: withdraws to noxious stimuli. DTR's: 1+ all over. Coordination and gait: no tested. Gait: no tested.   Lab Results: No results found for this basename: cbc, bmp, coags, chol, tri, ldl, hga1c   Lipid Panel No results found for this basename: CHOL, TRIG, HDL, CHOLHDL, VLDL, LDLCALC,  in the last 72 hours  Studies/Results: Dg Chest Port 1 View  11/10/2012   *RADIOLOGY REPORT*  Clinical Data: Myocardial infarction - endotracheal tube placement.  PORTABLE CHEST - 1 VIEW   Comparison: 11/10/2012 and prior chest radiographs dating back to 10/30/2012  Findings: An endotracheal tube appears in satisfactory position with tip 3.6 cm above the carina. A left IJ central venous catheters present with tip overlying the mid - upper SVC / azygos vein. An NG tube is present with tip overlying the  EG junction. The cardiomediastinal silhouette is stable. Continued left lower lung atelectasis/consolidation is noted. There is no evidence of pneumothorax or pulmonary edema.  IMPRESSION: Support apparatus as described.  Recommend NG tube advancement.  Continued left lower lobe atelectasis/consolidation.   Original Report Authenticated By: Harmon Pier, M.D.   Dg Chest Port 1 View  11/10/2012   *RADIOLOGY REPORT*  Clinical Data: Evaluate endotracheal tube  PORTABLE CHEST - 1 VIEW  Comparison: 11/09/2012; 11/08/2012; 11/07/2012  Findings:  Grossly unchanged cardiac silhouette and mediastinal contours. Stable positioning of support apparatus.  No pneumothorax. Heterogeneous air space opacities within the mid and inferior aspect of the left lung are similar to remote prior examinations. No new focal airspace opacities.  No definite pleural effusion. Unchanged bones.  IMPRESSION: 1.  Stable positioning of support apparatus.  No pneumothorax. 2.  Left mid and lower lung heterogeneous air space opacities are similar to the 11/07/2012 examination and again remain concerning for infection.   Original Report Authenticated By: Tacey Ruiz, MD    MEDICATIONS                                                                                                                       I have reviewed the patient's current medications.  ASSESSMENT/PLAN:                                                                                                           Persistent agitation in the context of post anoxic brain injury. No evidence of further seizures. I wonder if keppra could also be playing a role in patient's  agitation and thus will suggest stopping keppra and switching to IV valproic acid 500 or 750 mg BID. In terms of prognostication, I suspect that Mr. Stefan will suffer severe post anoxic cognitive impairment with compromise of her ability to function independently, as well as high risk for seizures. Please,call neurology with any questions or concerns.  Spoke with Dr. Sharol Harness.  Call back if needed.   Wyatt Portela, MD [facility] [phone]  11/11/2012, 9:26 AM

## 2012-11-11 NOTE — Procedures (Signed)
TRACHEOSTOMY TUBE PLACEMENT  INDICATION: need for prolonged ventilator support CONSENT: obtained from wife PREMEDS:  SEDATION USED DURING PROCEDURE:   PROCEDURE: performed with bronchoscopic assistance by Dr Tyson Alias Patient placed in proper position with shoulder roll  Landmarks identified Area prepped and draped in sterile fashion 1% lidocaine with epi infused liberally with effective blanching  1 cm vertical incision Hemostasis achieved Blunt dissection down to trachea Trachea entered with 14 gauge introducer needle and guide wire advanced under bronchoscopic visualization Punch dilator advanced over wire and removed 28 FR dilator advanced over wire and removed #8 cuffed Shiley over graduated dilator advanced over wire  Trach tube position confirmed with bronchoscopy Trach tube sutured in place  COMPLICATIONS: none. Procedure was well tolerated EBL: minimal   Billy Fischer, MD;  PCCM service; Mobile [phone]

## 2012-11-11 NOTE — Progress Notes (Signed)
NUTRITION FOLLOW UP  Intervention:   1. Recommend restart enteral nutrition as able post trach procedure. Previous regimen remains appropriate, Jevity 1.2 at 45 ml/hr and 30 ml Pro-stat QID. This provide 1696 kcal, 120 gm protein, and 872 ml free water.   2. Pt would likely benefit from placement of PEG tube.   Nutrition Dx:   Inadequate oral intake related to inability to eat as evidenced by NPO diet. Ongoing   Goal:   Meet >/=90% estimated nutrition needs. Currently unmet  Monitor:   Vent status, weight trends, labs   Assessment:   S/p EEG- suggesting encephalopathy, very agitated despite propofol. Pt was extubated on 6/9 but with agitation required reintubation. Per RN, planned for trach later today. Tube feeding off for procedure.  Pt would likely benefit from PEG placement if planned for slow wean from vent support. Expect will need ongoing enteral nutrition.    Height: Ht Readings from Last 1 Encounters:  10/30/12 5\' 9"  (1.753 m)    Weight Status:   Wt Readings from Last 1 Encounters:  11/10/12 164 lb 10.9 oz (74.7 kg)   Patient is currently intubated on ventilator support.  MV: 10.1 Temp:Temp (24hrs), Avg:99.1 F (37.3 C), Min:98.4 F (36.9 C), Max:99.9 F (37.7 C)  Propofol: 9 ml/hr providing 238 kcal from lipids daily    Re-estimated needs:  Kcal: 1905 Protein: >/= 114 gm  Fluid: >/= 1.5 L   Skin: intact   Diet Order: NPO   Intake/Output Summary (Last 24 hours) at 11/11/12 1002 Last data filed at 11/11/12 0900  Gross per 24 hour  Intake 2741.67 ml  Output   2225 ml  Net 516.67 ml    Last BM: 6/10   Labs:   Recent Labs Lab 11/09/12 0500 11/10/12 0400 11/11/12 0456  NA 144 141 141  K 4.0 4.1 4.2  CL 107 104 106  CO2 28 30 27   BUN 21 27* 23  CREATININE 0.67 0.71 0.70  CALCIUM 8.6 9.0 8.8  MG 2.3 2.5  --   GLUCOSE 113* 162* 138*    CBG (last 3)   Recent Labs  11/10/12 1647 11/10/12 2354 11/11/12 0744  GLUCAP 118* 127* 128*     Scheduled Meds: . antiseptic oral rinse  15 mL Mouth Rinse QID  . aspirin  300 mg Rectal Daily  . atorvastatin  80 mg Oral q1800  . budesonide  0.25 mg Nebulization Q6H  . chlorhexidine  15 mL Mouth Rinse BID  . cloNIDine  0.1 mg Transdermal Weekly  . clopidogrel  75 mg Oral Q breakfast  . enalaprilat  1.25 mg Intravenous Q6H  . enoxaparin (LOVENOX) injection  40 mg Subcutaneous Daily  . etomidate  40 mg Intravenous Once  . fentaNYL  200 mcg Intravenous Once  . ipratropium  0.5 mg Nebulization Q6H  . levalbuterol  0.63 mg Nebulization Q6H  . metoprolol  5 mg Intravenous Q6H  . midazolam  4 mg Intravenous Once  . pantoprazole (PROTONIX) IV  40 mg Intravenous Q24H  . phenytoin (DILANTIN) IV  125 mg Intravenous Q8H  . valproate sodium  500 mg Intravenous Q12H  . vecuronium  10 mg Intravenous Once    Continuous Infusions: . sodium chloride    . dexmedetomidine Stopped (11/10/12 1240)  . dextrose 5 % and 0.45 % NaCl with KCl 20 mEq/L 50 mL/hr at 11/11/12 0509  . fentaNYL infusion INTRAVENOUS 50 mcg/hr (11/11/12 0958)  . midazolam (VERSED) infusion    . propofol 30 mcg/kg/min (  11/11/12 0959)    Clarene Duke RD, LDN Pager [phone] After Hours pager [phone]

## 2012-11-11 NOTE — Progress Notes (Signed)
Brief Nutrition Note:   Pt now s/p trach. RD consulted for initiation and management of enteral nutrition per adult tube feeding protocol.   Pt has NG tube in place. Per most recent CXR, tip in distal esophagus. RN confirmed that tube has been repositioned. RD will change orders to reflect previous recommendations. Please see note from earlier today for details.   Clarene Duke RD, LDN Pager [phone] After Hours pager [phone]

## 2012-11-11 NOTE — Progress Notes (Signed)
Peripherally Inserted Central Catheter/Midline Placement  The IV Nurse has discussed with the patient and/or persons authorized to consent for the patient, the purpose of this procedure and the potential benefits and risks involved with this procedure.  The benefits include less needle sticks, lab draws from the catheter and patient may be discharged home with the catheter.  Risks include, but not limited to, infection, bleeding, blood clot (thrombus formation), and puncture of an artery; nerve damage and irregular heat beat.  Alternatives to this procedure were also discussed.  PICC/Midline Placement Documentation        Lisabeth Devoid 11/11/2012, 6:12 PM Consent obtained by staff RN

## 2012-11-11 NOTE — Progress Notes (Signed)
PULMONARY  / CRITICAL CARE MEDICINE  Name: Walter Frazier MRN: [id number] DOB: 01-Feb-1956    ADMISSION DATE:  10/30/2012 CONSULTATION DATE:  10/30/2012  REFERRING MD :  Diona Browner PRIMARY SERVICE: PCCM  CHIEF COMPLAINT:  Cardiac arrest  BRIEF PATIENT DESCRIPTION: 57 y/o male smoker with no known past medical history was admitted to [facility] on 5/29 after cardiac arrest from a STEMI.  He was comatose afterwards and was started on the hypothermia protocol and transferred to [facility].  SIGNIFICANT EVENTS / STUDIES:  5/29 - LHC > DES to prox LAD @ [facility]. IABP placed 5/29 - Transfer to [facility] for hypothermia 5/30 - Echo >> EF 25%, LV severely dilated 6/02 - RN noted ?myoclonus - sedation infusions resumed. Purposeful on WUA but not F/C.  6/02 - Neuro consult: EEG > non-convulsive status epilepticus. Resolution of these discharges after ativan. Loaded with fosphenytoin.  6/02 - CT head: NAD 6/03 - 24 hr EEG:  Diffuse slowing, suggestive of general encephalopathy 6/04 - Started on Keppra 6/06 - daily progress neuro wise, severe agiation 6/07 - weaned for 1 hour on PSV, agitation limiting factor 6/09 Passed SBT, extubated, tolerated approx 3 hrs. Very agitated, tachypneic despite dexmedetomidine. Reintubated. Trach planned 6/10 Trach tube placed  LINES / TUBES: IABP 5/29 >> 6/01 ETT 5/29 >> 6/09, 6/09 >> 6/10 L IJ CVL 5/29 >> 6/10 L radial A-line 5/29 >> 6/4 Trach 6/10 >>  PICC 6/10 >>   CULTURES: MRSA PCR 5/29 >> neg  ANTIBIOTICS: None   SUBJ/Overnight: Still requiring very high doses of sedation to maintain in bed and provide effective nursing care   PHYSICAL EXAMINATION: Gen: Intermittently severely agitated off propofol HEENT: Poor dentition, o/w WNL PULM: scattered rhonchi CV: RRR s M AB: BS+, soft, nontender, no hsm Ext: warm/dry, no edema Neuro: moves all ext with full strength, but no follow commands  LABS: BMET  Recent Labs Lab 11/07/12 0425 11/08/12 0500  11/09/12 0500 11/10/12 0400 11/11/12 0456  NA 144 145 144 141 141  K 3.4* 3.8 4.0 4.1 4.2  CL 108 109 107 104 106  CO2 27 29 28 30 27   GLUCOSE 129* 135* 113* 162* 138*  BUN 14 20 21  27* 23  CREATININE 0.65 0.61 0.67 0.71 0.70  CALCIUM 8.5 8.9 8.6 9.0 8.8  MG  --   --  2.3 2.5  --     CBC  Recent Labs Lab 11/09/12 0500 11/10/12 0400 11/11/12 0456  HGB 12.8* 13.2 12.4*  HCT 38.0* 38.1* 36.6*  WBC 17.0* 15.7* 16.8*  PLT 345 424* 455*    ABG    Component Value Date/Time   PHART 7.437 11/11/2012 0430   PCO2ART 41.1 11/11/2012 0430   PO2ART 90.0 11/11/2012 0430   HCO3 27.7* 11/11/2012 0430   TCO2 29 11/11/2012 0430   ACIDBASEDEF 0.6 11/03/2012 0645   O2SAT 97.0 11/11/2012 0430     Recent Labs Lab 11/10/12 0355 11/10/12 0744 11/10/12 1647 11/10/12 2354 11/11/12 0744  GLUCAP 153* 136* 118* 127* 128*    CXR: trach tube well positioned. No PTX. Minimal LLL atx  ASSESSMENT / PLAN:  CARDIOVASCULAR A:  Cardiac arrest - due to recurrent VT STEMI - AWMI Prox LAD occlusion - s/p DES PCI @ [facility] Cardiogenic shock - resolved.   HTN  P:  -continue current Rx -Cards following  PULMONARY A:  Acute respiratory failure in the setting of cardiac arrest LLL Airspace Disease - atelectasis Failed extubation 6/09  P:   -trach placed -Wean  to ATC as tolerated  NEUROLOGIC A:   Post anoxic encephalopathy - severe. Prognosis guarded Status epilepticus, resolved Severe agitation  P:   -Cont anticonvulsants per Neuro - changed from Keppra to VPA 6/10 -Cont propofol for now -Place fentanyl patch and D/C fentanyl infusion  RENAL A: Hyponatremia, resolved Hypokalemia, resolved P:   -Correct electrolytes as indicated   GASTROINTESTINAL A:  Tolerating TF P:   -Cont PPI -Resume TFs   HEMATOLOGIC A:   DVT prevention P:  - Cont same   INFECTIOUS Leukocytosis - afebrile, no evidence of infectious source P:   -monitor off abx for now   35 mins  CCM   Billy Fischer, MD ; [facility] service Mobile [phone].  After 5:30 PM or weekends, call [phone]

## 2012-11-11 NOTE — Progress Notes (Signed)
No SBT/weaning done due to pt is getting a trach this AM and the pt is lethargic from sedation. No complications noted. RT Will monitor

## 2012-11-11 NOTE — Procedures (Signed)
Bedside Tracheostomy Insertion Procedure Note   Patient Details:   Name: Walter Frazier DOB: [birth year] MRN: [id number]  Procedure: Tracheostomy  Pre Procedure Assessment: ET Tube Size:8.0 ET Tube secured at lip (cm):24 Bite block in place: Yes Breath Sounds: Clear  Post Procedure Assessment: BP 153/76  Pulse 74  Temp(Src) 98.7 F (37.1 C) (Oral)  Resp 20  Ht 5\' 9"  (1.753 m)  Wt 164 lb 10.9 oz (74.7 kg)  BMI 24.31 kg/m2  SpO2 100% O2 sats: stable throughout Complications: No apparent complications Patient did tolerate procedure well Tracheostomy Brand:Shiley Tracheostomy Style:Cuffed Tracheostomy Size: 8.0 Tracheostomy Secured WUJ:[id number], velcro Tracheostomy Placement Confirmation:Trach cuff visualized and in place, cxr taken for placement    Readling, Tammie Ann 11/11/2012, 12:18 PM        Billy Fischer, MD ; [facility] service Mobile [phone].  After 5:30 PM or weekends, call [phone]

## 2012-11-11 NOTE — Progress Notes (Signed)
SUBJECTIVE:  Extubated for about 3 hours 6/9, re-intubated due to agitation, tachypnea.  Currently with mild sedation plus PRN boluses, opens eyes to voice, no response to commands.   PHYSICAL EXAM Filed Vitals:   11/11/12 0730 11/11/12 0800 11/11/12 0830 11/11/12 0840  BP: 111/62 115/62 89/55 92/58   Pulse:      Temp: 98.6 F (37 C) 99 F (37.2 C) 98.8 F (37.1 C) 98.6 F (37 C)  TempSrc:  Core (Comment)    Resp: 22 21 18 18   Height:      Weight:      SpO2: 100% 99% 99% 100%   General:  WD, WN WM, NAD Lungs:  Just suctioned, some basilar rales, no rhonchi Heart:  RRR, no sig M/R/G Abdomen:  Soft, + BS Extremities:  No edema, pulses intact.  LABS: Results for orders placed during the hospital encounter of 10/30/12 (from the past 24 hour(s))  PHENYTOIN LEVEL, TOTAL     Status: Abnormal   Collection Time    11/10/12  9:22 AM      Result Value Range   Phenytoin Lvl 8.4 (*) 10.0 - 20.0 ug/mL  GLUCOSE, CAPILLARY     Status: Abnormal   Collection Time    11/10/12  4:47 PM      Result Value Range   Glucose-Capillary 118 (*) 70 - 99 mg/dL  GLUCOSE, CAPILLARY     Status: Abnormal   Collection Time    11/10/12 11:54 PM      Result Value Range   Glucose-Capillary 127 (*) 70 - 99 mg/dL  POCT I-STAT 3, BLOOD GAS (G3+)     Status: Abnormal   Collection Time    11/11/12  4:30 AM      Result Value Range   pH, Arterial 7.437  7.350 - 7.450   pCO2 arterial 41.1  35.0 - 45.0 mmHg   pO2, Arterial 90.0  80.0 - 100.0 mmHg   Bicarbonate 27.7 (*) 20.0 - 24.0 mEq/L   TCO2 29  0 - 100 mmol/L   O2 Saturation 97.0     Acid-Base Excess 3.0 (*) 0.0 - 2.0 mmol/L   Collection site RADIAL, ALLEN'S TEST ACCEPTABLE     Drawn by Operator     Sample type ARTERIAL    CBC     Status: Abnormal   Collection Time    11/11/12  4:56 AM      Result Value Range   WBC 16.8 (*) 4.0 - 10.5 K/uL   RBC 3.94 (*) 4.22 - 5.81 MIL/uL   Hemoglobin 12.4 (*) 13.0 - 17.0 g/dL   HCT 19.1 (*) 47.8 - 29.5 %   MCV 92.9  78.0 - 100.0 fL   MCH 31.5  26.0 - 34.0 pg   MCHC 33.9  30.0 - 36.0 g/dL   RDW 62.1  30.8 - 65.7 %   Platelets 455 (*) 150 - 400 K/uL  BASIC METABOLIC PANEL     Status: Abnormal   Collection Time    11/11/12  4:56 AM      Result Value Range   Sodium 141  135 - 145 mEq/L   Potassium 4.2  3.5 - 5.1 mEq/L   Chloride 106  96 - 112 mEq/L   CO2 27  19 - 32 mEq/L   Glucose, Bld 138 (*) 70 - 99 mg/dL   BUN 23  6 - 23 mg/dL   Creatinine, Ser 8.46  0.50 - 1.35 mg/dL   Calcium 8.8  8.4 - 96.2  mg/dL   GFR calc non Af Amer >90  >90 mL/min   GFR calc Af Amer >90  >90 mL/min  PROTIME-INR     Status: None   Collection Time    11/11/12  4:56 AM      Result Value Range   Prothrombin Time 13.6  11.6 - 15.2 seconds   INR 1.05  0.00 - 1.49  APTT     Status: None   Collection Time    11/11/12  4:56 AM      Result Value Range   aPTT 28  24 - 37 seconds  GLUCOSE, CAPILLARY     Status: Abnormal   Collection Time    11/11/12  7:44 AM      Result Value Range   Glucose-Capillary 128 (*) 70 - 99 mg/dL   Lab Results  Component Value Date   HGBA1C 5.8* 10/31/2012    Intake/Output Summary (Last 24 hours) at 11/11/12 0901 Last data filed at 11/11/12 0800  Gross per 24 hour  Intake 2680.67 ml  Output   2225 ml  Net 455.67 ml   Filed Weights   10/30/12 2310 11/06/12 0500 11/10/12 0400  Weight: 168 lb 3.4 oz (76.3 kg) 177 lb 14.6 oz (80.7 kg) 164 lb 10.9 oz (74.7 kg)   EKG:  SR  Dg Chest Port 1 View 11/10/2012   *RADIOLOGY REPORT*  Clinical Data: Myocardial infarction - endotracheal tube placement.  PORTABLE CHEST - 1 VIEW  Comparison: 11/10/2012 and prior chest radiographs dating back to 10/30/2012  Findings: An endotracheal tube appears in satisfactory position with tip 3.6 cm above the carina. A left IJ central venous catheters present with tip overlying the mid - upper SVC / azygos vein. An NG tube is present with tip overlying the EG junction. The cardiomediastinal silhouette is  stable. Continued left lower lung atelectasis/consolidation is noted. There is no evidence of pneumothorax or pulmonary edema.  IMPRESSION: Support apparatus as described.  Recommend NG tube advancement.  Continued left lower lobe atelectasis/consolidation.   Original Report Authenticated By: Harmon Pier, M.D.   Dg Chest Port 1 View 11/10/2012   *RADIOLOGY REPORT*  Clinical Data: Evaluate endotracheal tube  PORTABLE CHEST - 1 VIEW  Comparison: 11/09/2012; 11/08/2012; 11/07/2012  Findings:  Grossly unchanged cardiac silhouette and mediastinal contours. Stable positioning of support apparatus.  No pneumothorax. Heterogeneous air space opacities within the mid and inferior aspect of the left lung are similar to remote prior examinations. No new focal airspace opacities.  No definite pleural effusion. Unchanged bones.  IMPRESSION: 1.  Stable positioning of support apparatus.  No pneumothorax. 2.  Left mid and lower lung heterogeneous air space opacities are similar to the 11/07/2012 examination and again remain concerning for infection.   Original Report Authenticated By: Tacey Ruiz, MD    ASSESSMENT AND PLAN: 57 yo male with no previous cardiac history was admitted 5/29 post-arrest from anterior STEMI, s/p DES prox LAD, EF 25%. S/P hypothermia protocol but agitation (encephalopathy) limits vent wean. Likely will need trach.     STEMI (ST elevation myocardial infarction) - anterior: On ASA, statin, ACE. Lopressor is IV, SBP low at times, may need to D/C catapres patch 0.1 mg (new to him). Will leave to MD.  Otherwise, per CCM. Principal Problem:   Cardiac arrest Active Problems:   V-tach   Acute respiratory failure with hypoxia   Encephalopathy acute   Status epilepticus    Walter Frazier 11/11/2012 9:01 AM

## 2012-11-11 NOTE — Procedures (Signed)
Bronchoscopy Procedure Note Walter Frazier [id number] [birth year]  Procedure: Bronchoscopy Indications: Placement of Tracheostomy  Procedure Details Consent: Risks of procedure as well as the alternatives and risks of each were explained to the (patient/caregiver).  Consent for procedure obtained. Time Out: Verified patient identification, verified procedure, site/side was marked, verified correct patient position, special equipment/implants available, medications/allergies/relevent history reviewed, required imaging and test results available.  Performed  In preparation for procedure, patient was given 100% FiO2 and bronchoscope lubricated. Sedation: Benzodiazepines  Airway entered and the following bronchi were examined: noted needle, wire, punch dil, progressive dil and trach placement   Bronchoscope removed.    Evaluation Hemodynamic Status: BP stable throughout; O2 sats: stable throughout Patient's Current Condition: stable Specimens:  None Complications: No apparent complications Patient did tolerate procedure well.   Procedure performed under direct supervision of Dr. Tyson Alias.   [name], NP-C [facility] Pgr: [phone] or [phone]  Tolerated well Direct vis all parts of case No injury noted  Mcarthur Rossetti. Tyson Alias, MD, FACP Pgr: [phone] [facility]   11/11/2012

## 2012-11-12 LAB — URINALYSIS, ROUTINE W REFLEX MICROSCOPIC
Bilirubin Urine: NEGATIVE
Ketones, ur: NEGATIVE mg/dL
Specific Gravity, Urine: 1.02 (ref 1.005–1.030)
pH: 5.5 (ref 5.0–8.0)

## 2012-11-12 LAB — GLUCOSE, CAPILLARY: Glucose-Capillary: 128 mg/dL — ABNORMAL HIGH (ref 70–99)

## 2012-11-12 MED ORDER — HALOPERIDOL LACTATE 5 MG/ML IJ SOLN
5.0000 mg | Freq: Once | INTRAMUSCULAR | Status: AC
  Administered 2012-11-12: 5 mg via INTRAVENOUS

## 2012-11-12 MED ORDER — VALPROIC ACID 250 MG/5ML PO SYRP
500.0000 mg | ORAL_SOLUTION | Freq: Two times a day (BID) | ORAL | Status: DC
  Administered 2012-11-12 – 2012-11-20 (×17): 500 mg
  Filled 2012-11-12 (×24): qty 10

## 2012-11-12 MED ORDER — HALOPERIDOL LACTATE 5 MG/ML IJ SOLN: 5.0000 mg | Freq: Once | INTRAMUSCULAR | Status: DC

## 2012-11-12 MED ORDER — FENTANYL CITRATE 0.05 MG/ML IJ SOLN
25.0000 ug | INTRAMUSCULAR | Status: DC | PRN
  Administered 2012-11-12 – 2012-11-13 (×4): 100 ug via INTRAVENOUS
  Administered 2012-11-13: 50 ug via INTRAVENOUS
  Administered 2012-11-13: 100 ug via INTRAVENOUS
  Administered 2012-11-13: 50 ug via INTRAVENOUS
  Administered 2012-11-13 – 2012-11-14 (×4): 100 ug via INTRAVENOUS
  Filled 2012-11-12 (×9): qty 2

## 2012-11-12 MED ORDER — CLONAZEPAM 1 MG PO TABS
1.0000 mg | ORAL_TABLET | Freq: Two times a day (BID) | ORAL | Status: DC
  Administered 2012-11-12 – 2012-11-13 (×3): 1 mg via ORAL
  Filled 2012-11-12 (×3): qty 1

## 2012-11-12 MED ORDER — VALPROIC ACID 250 MG/5ML PO SYRP
500.0000 mg | ORAL_SOLUTION | Freq: Two times a day (BID) | ORAL | Status: DC
  Filled 2012-11-12 (×2): qty 10

## 2012-11-12 MED ORDER — CLONIDINE HCL 0.1 MG/24HR TD PTWK
0.1000 mg | MEDICATED_PATCH | TRANSDERMAL | Status: DC
  Administered 2012-11-12: 0.1 mg via TRANSDERMAL
  Filled 2012-11-12: qty 1

## 2012-11-12 MED ORDER — FENTANYL CITRATE 0.05 MG/ML IJ SOLN
25.0000 ug | INTRAMUSCULAR | Status: DC | PRN
  Administered 2012-11-12 (×2): 50 ug via INTRAVENOUS
  Administered 2012-11-12: 100 ug via INTRAVENOUS
  Filled 2012-11-12 (×3): qty 2

## 2012-11-12 MED ORDER — HALOPERIDOL LACTATE 5 MG/ML IJ SOLN
INTRAMUSCULAR | Status: AC
  Filled 2012-11-12: qty 1

## 2012-11-12 MED ORDER — FREE WATER
200.0000 mL | Freq: Three times a day (TID) | Status: DC
  Administered 2012-11-12 – 2012-11-20 (×21): 200 mL

## 2012-11-12 MED ORDER — METOPROLOL TARTRATE 1 MG/ML IV SOLN: 2.5000 mg | INTRAVENOUS | Status: DC | PRN

## 2012-11-12 MED ORDER — DEXTROSE 5 % IV SOLN
2.0000 g | INTRAVENOUS | Status: DC
  Administered 2012-11-12: 2 g via INTRAVENOUS
  Filled 2012-11-12 (×2): qty 2

## 2012-11-12 MED ORDER — LORAZEPAM 2 MG/ML IJ SOLN
0.5000 mg | INTRAMUSCULAR | Status: DC | PRN
  Administered 2012-11-12 – 2012-11-19 (×14): 1 mg via INTRAVENOUS
  Filled 2012-11-12 (×14): qty 1

## 2012-11-12 NOTE — Progress Notes (Signed)
11/12/2012 1430  Extremem agitation. Dr. Sung Amabile called. Ativan 1 mg IV given. Still restless. Pulling at gown. Bladder scan done. No void since 10 am.  Large amount of urine.  14 Fr. Foley inserted without difficulty sterile process. Dr. Sung Amabile informed of milky drainage around penis prior to foley insertion.  Cynthia Stainback, [place] Fulling

## 2012-11-12 NOTE — Progress Notes (Signed)
eLink Physician-Brief Progress Note Patient Name: Walter Frazier DOB: [birth year] MRN: [id number]  Date of Service  11/12/2012   HPI/Events of Note   Pyuria,fever  eICU Interventions   UA, UC, Ceftriaxone   Intervention Category Intermediate Interventions: Infection - evaluation and management  Jule Whitsel 11/12/2012, 6:37 PM

## 2012-11-12 NOTE — Progress Notes (Signed)
Wasted of fentanyl in sink with [place], Charity fundraiser.

## 2012-11-12 NOTE — Progress Notes (Signed)
PULMONARY  / CRITICAL CARE MEDICINE  Name: Walter Frazier MRN: [id number] DOB: 1955/08/15    ADMISSION DATE:  10/30/2012 CONSULTATION DATE:  10/30/2012  REFERRING MD :  Diona Browner PRIMARY SERVICE: PCCM  CHIEF COMPLAINT:  Cardiac arrest  BRIEF PATIENT DESCRIPTION: 57 y/o male smoker with no known past medical history was admitted to [facility] on 5/29 after cardiac arrest from a STEMI.  He was comatose afterwards and was started on the hypothermia protocol and transferred to [facility].  SIGNIFICANT EVENTS / STUDIES:  5/29 - LHC > DES to prox LAD @ [facility]. IABP placed 5/29 - Transfer to [facility] for hypothermia 5/30 - Echo >> EF 25%, LV severely dilated 6/02 - RN noted ?myoclonus - sedation infusions resumed. Purposeful on WUA but not F/C.  6/02 - Neuro consult: EEG > non-convulsive status epilepticus. Resolution of these discharges after ativan. Loaded with fosphenytoin.  6/02 - CT head: NAD 6/03 - 24 hr EEG:  Diffuse slowing, suggestive of general encephalopathy 6/04 - Started on Keppra 6/06 - daily progress neuro wise, severe agiation 6/07 - weaned for 1 hour on PSV, agitation limiting factor 6/09 Passed SBT, extubated, tolerated approx 3 hrs. Very agitated, tachypneic despite dexmedetomidine. Reintubated. Trach planned 6/10 Trach tube placed  LINES / TUBES: IABP 5/29 >> 6/01 ETT 5/29 >> 6/09, 6/09 >> 6/10 L IJ CVL 5/29 >> 6/10 L radial A-line 5/29 >> 6/4 Trach 6/10 >>  PICC 6/10 >>   CULTURES: MRSA PCR 5/29 >> neg  ANTIBIOTICS: None   SUBJ/Overnight: Less agitated off propofol. Still squirms a lot. Regards examiner but does not F/C  PHYSICAL EXAMINATION: Gen: Intermittently agitated HEENT: Poor dentition, o/w WNL PULM: scattered rhonchi CV: RRR s M AB: BS+, soft, nontender, no hsm Ext: warm/dry, no edema Neuro: moves all ext with full strength, but no follow commands  LABS: BMET    Component Value Date/Time   NA 141 11/11/2012 0456   K 4.2 11/11/2012 0456   CL 106 11/11/2012  0456   CO2 27 11/11/2012 0456   GLUCOSE 138* 11/11/2012 0456   BUN 23 11/11/2012 0456   CREATININE 0.70 11/11/2012 0456   CALCIUM 8.8 11/11/2012 0456   GFRNONAA >90 11/11/2012 0456   GFRAA >90 11/11/2012 0456     CBC    Component Value Date/Time   WBC 16.8* 11/11/2012 0456   RBC 3.94* 11/11/2012 0456   HGB 12.4* 11/11/2012 0456   HCT 36.6* 11/11/2012 0456   PLT 455* 11/11/2012 0456   MCV 92.9 11/11/2012 0456   MCH 31.5 11/11/2012 0456   MCHC 33.9 11/11/2012 0456   RDW 13.2 11/11/2012 0456   LYMPHSABS 2.1 11/08/2012 0500   MONOABS 1.3* 11/08/2012 0500   EOSABS 0.4 11/08/2012 0500   BASOSABS 0.1 11/08/2012 0500     ABG    Component Value Date/Time   PHART 7.437 11/11/2012 0430   PCO2ART 41.1 11/11/2012 0430   PO2ART 90.0 11/11/2012 0430   HCO3 27.7* 11/11/2012 0430   TCO2 29 11/11/2012 0430   ACIDBASEDEF 0.6 11/03/2012 0645   O2SAT 97.0 11/11/2012 0430     Recent Labs Lab 11/10/12 1647 11/10/12 2354 11/11/12 0744 11/12/12 0455 11/12/12 0747  GLUCAP 118* 127* 128* 103* 128*    CXR: no new film  ASSESSMENT / PLAN:  CARDIOVASCULAR A:  Cardiac arrest - due to recurrent VT Anterior wall STEMI 5/29, prox LAD occlusion - s/p DES PCI @ [facility] Cardiogenic shock - resolved.   HTN  P:  -continue current Rx -Cards following  PULMONARY A:  Acute respiratory failure in the setting of cardiac arrest LLL Airspace Disease - atelectasis, resolved Failed extubation 6/09 Tracheostomy status P:   -Wean to ATC as tolerated   NEUROLOGIC A:   Post anoxic encephalopathy - severe. Prognosis guarded Status epilepticus, resolved Severe agitation - improved  P:   -Cont anticonvulsants - phenytoin and VPA -D/C propofol -Cont fentanyl patch -Begin scheduled clonazepam 6/11 -PRN fentanyl -Cont clonidine patch as adjunct    RENAL A: Hyponatremia, resolved Hypokalemia, resolved P:   -Correct electrolytes as indicated   GASTROINTESTINAL A:  Tolerating TF P:   -Cont PPI -Resume  TFs -Add free water 6/11   HEMATOLOGIC A:   DVT prevention P:  - Cont LMWH   INFECTIOUS Leukocytosis - afebrile, no evidence of infectious source P:   -monitor off abx for now   35 mins CCM. Wife and sister updated   Billy Fischer, MD ; [facility] service Mobile [phone].  After 5:30 PM or weekends, call [phone]

## 2012-11-12 NOTE — Progress Notes (Signed)
Patient Name: Walter Frazier Date of Encounter: 11/12/2012  Principal Problem:   Cardiac arrest Active Problems:   STEMI (ST elevation myocardial infarction)   V-tach   Acute respiratory failure with hypoxia   Encephalopathy acute   Status epilepticus    SUBJECTIVE: Occasionally opens eyes to verbal, no directed responses to stimulus. Requiring some sedation.   OBJECTIVE Filed Vitals:   11/12/12 0400 11/12/12 0500 11/12/12 0600 11/12/12 0700  BP: 110/54 116/65 123/66 97/61  Pulse:      Temp: 99.4 F (37.4 C)     TempSrc: Oral     Resp: 28 18 18 16   Height:      Weight:      SpO2: 100% 98% 99% 97%    Intake/Output Summary (Last 24 hours) at 11/12/12 0733 Last data filed at 11/12/12 0600  Gross per 24 hour  Intake 2109.62 ml  Output   1715 ml  Net 394.62 ml   Filed Weights   10/30/12 2310 11/06/12 0500 11/10/12 0400  Weight: 168 lb 3.4 oz (76.3 kg) 177 lb 14.6 oz (80.7 kg) 164 lb 10.9 oz (74.7 kg)    PHYSICAL EXAM General: Well developed, well nourished, male sedated, occasional spontaneous arm movements, ? Posturing? Head: Normocephalic, atraumatic.  Neck: Supple without bruits, JVD not elevated. Lungs:  Resp regular and unlabored, few rales bases. Heart: RRR, S1, S2, no S3, S4, or murmur; no rub. Abdomen: Soft, non-tender, non-distended, BS + x 4.  Extremities: No clubbing, cyanosis, no edema.  Neuro: Alert and oriented X 3. Moves all extremities spontaneously. Psych: Normal affect.  LABS: CBC: Recent Labs  11/10/12 0400 11/11/12 0456  WBC 15.7* 16.8*  HGB 13.2 12.4*  HCT 38.1* 36.6*  MCV 92.3 92.9  PLT 424* 455*   INR: Recent Labs  11/11/12 0456  INR 1.05   Basic Metabolic Panel: Recent Labs  11/10/12 0400 11/11/12 0456  NA 141 141  K 4.1 4.2  CL 104 106  CO2 30 27  GLUCOSE 162* 138*  BUN 27* 23  CREATININE 0.71 0.70  CALCIUM 9.0 8.8  MG 2.5  --    TELE:  SR     Radiology/Studies: Chest Portable 1 View To Assess Tube  Placement And Rule-out Pneumothorax 11/11/2012   *RADIOLOGY REPORT*  Clinical Data: Tracheostomy tube placement.  Acute respiratory failure.  Myocardial infarction.  PORTABLE CHEST - 1 VIEW  Comparison: 11/10/2012  Findings: A new tracheostomy tube is seen in appropriate position in the mid thoracic trachea.  A left internal jugular center venous catheter remains in appropriate position with tip in the mid SVC. A nasogastric tube been pulled back, with the distal tip now in the proximal thoracic esophagus. No pneumothorax identified.  There is mild atelectasis in the medial left lung base which shows no significant change.  Right lung is clear.  Heart size is within normal limits.  IMPRESSION:  New tracheostomy tube in appropriate position.  No evidence of pneumomediastinum or pneumothorax. 2.  No significant change and left basilar atelectasis. 3.  Abnormal positioning of nasogastric tube, with tip in the proximal thoracic esophagus.   Original Report Authenticated By: Myles Rosenthal, M.D.   Dg Chest Port 1 View 11/10/2012   *RADIOLOGY REPORT*  Clinical Data: Myocardial infarction - endotracheal tube placement.  PORTABLE CHEST - 1 VIEW  Comparison: 11/10/2012 and prior chest radiographs dating back to 10/30/2012  Findings: An endotracheal tube appears in satisfactory position with tip 3.6 cm above the carina. A left IJ central  venous catheters present with tip overlying the mid - upper SVC / azygos vein. An NG tube is present with tip overlying the EG junction. The cardiomediastinal silhouette is stable. Continued left lower lung atelectasis/consolidation is noted. There is no evidence of pneumothorax or pulmonary edema.  IMPRESSION: Support apparatus as described.  Recommend NG tube advancement.  Continued left lower lobe atelectasis/consolidation.   Original Report Authenticated By: Harmon Pier, M.D.     Current Medications:  . antiseptic oral rinse  15 mL Mouth Rinse QID  . aspirin  81 mg Per Tube Daily  .  atorvastatin  80 mg Oral q1800  . budesonide  0.25 mg Nebulization Q6H  . chlorhexidine  15 mL Mouth Rinse BID  . cloNIDine  0.1 mg Transdermal Weekly  . clopidogrel  75 mg Per Tube Q breakfast  . enalapril  2.5 mg Per Tube BID  . enoxaparin (LOVENOX) injection  40 mg Subcutaneous Daily  . feeding supplement  30 mL Per Tube QID  . fentaNYL  100 mcg Transdermal Q72H  . ipratropium  0.5 mg Nebulization Q6H  . levalbuterol  0.63 mg Nebulization Q6H  . metoprolol tartrate  25 mg Per Tube BID  . pantoprazole sodium  40 mg Per Tube Daily  . phenytoin  200 mg Per Tube BID  . sodium chloride  10-40 mL Intracatheter Q12H  . valproate sodium  500 mg Intravenous Q12H   . sodium chloride 10 mL/hr at 11/11/12 1851  . sodium chloride 10 mL/hr at 11/11/12 2258  . feeding supplement (JEVITY 1.2 CAL) 1,000 mL (11/11/12 1600)  . propofol 60 mcg/kg/min (11/12/12 0616)    ASSESSMENT AND PLAN: 57 yo male with no previous cardiac history was admitted 5/29 post-arrest from anterior STEMI, s/p DES prox LAD, EF 25%. S/P hypothermia protocol but agitation (encephalopathy) limits vent wean. S/P trach 6/10, on tube feeds, plan is for [facility]   STEMI (ST elevation myocardial infarction) - anterior: On ASA, statin, ACE, Lopressor. Meds are all per tube now. SBP now 90s at times, will D/C catapres patch 0.1 mg (not a home med) as it was started for very high BP in the setting of agitation.   Otherwise, per CCM  Principal Problem:   Cardiac arrest Active Problems:   STEMI (ST elevation myocardial infarction)   V-tach   Acute respiratory failure with hypoxia   Encephalopathy acute   Status epilepticus   Melida Quitter , PA-C 7:33 AM 11/12/2012

## 2012-11-12 NOTE — Progress Notes (Signed)
Called Elink due to severe agitation.  Pt is breathing 45 breaths/min with a HR of 150.   He is thrashing in the bed and unable to follow directions or to console.  Ativan 1 mg was given iv per prn orders.  Orders received from Dr. Marin Shutter to administer Haldol 5 mg iv x 1 and this was done.  RT is in the room to return pt to full ventilator support.  Also reported to MD that pt has a fever of 101.5 and noted the sediment and cloudiness of the urine and the purulent drainage from the  Urinary meatus.  New orders received.

## 2012-11-12 NOTE — Progress Notes (Signed)
    I have seen and examined the patient. I agree with the above note with the addition of : still with confusion and agitation due to anoxic encephalopathy. BP is borderline low with tachycardia.  I d/ced Clonidine patch. Try to advance Metoprolol dose.  Continue other meds.   Lorine Bears MD, [facility] 11/12/2012 3:50 PM

## 2012-11-13 LAB — GLUCOSE, CAPILLARY
Glucose-Capillary: 116 mg/dL — ABNORMAL HIGH (ref 70–99)
Glucose-Capillary: 158 mg/dL — ABNORMAL HIGH (ref 70–99)
Glucose-Capillary: 91 mg/dL (ref 70–99)

## 2012-11-13 MED ORDER — QUETIAPINE FUMARATE 100 MG PO TABS
100.0000 mg | ORAL_TABLET | Freq: Two times a day (BID) | ORAL | Status: DC
  Administered 2012-11-13 – 2012-11-15 (×5): 100 mg via ORAL
  Filled 2012-11-13 (×7): qty 1

## 2012-11-13 MED ORDER — CLONAZEPAM 0.5 MG PO TABS
1.0000 mg | ORAL_TABLET | Freq: Every day | ORAL | Status: DC
  Administered 2012-11-14 – 2012-11-15 (×2): 1 mg via ORAL
  Filled 2012-11-13: qty 1
  Filled 2012-11-13: qty 2

## 2012-11-13 MED ORDER — CLONIDINE HCL 0.1 MG/24HR TD PTWK
0.1000 mg | MEDICATED_PATCH | TRANSDERMAL | Status: DC
  Administered 2012-11-13: 0.1 mg via TRANSDERMAL
  Filled 2012-11-13: qty 1

## 2012-11-13 MED ORDER — DEXTROSE 5 % IV SOLN
2.0000 g | INTRAVENOUS | Status: AC
  Administered 2012-11-13 – 2012-11-15 (×3): 2 g via INTRAVENOUS
  Filled 2012-11-13 (×5): qty 2

## 2012-11-13 MED ORDER — CLONAZEPAM 1 MG PO TABS
2.0000 mg | ORAL_TABLET | Freq: Every day | ORAL | Status: DC
  Administered 2012-11-13 – 2012-11-14 (×2): 2 mg
  Filled 2012-11-13 (×2): qty 2

## 2012-11-13 MED ORDER — CLONAZEPAM 0.1 MG/ML ORAL SUSPENSION
2.0000 mg | Freq: Every day | ORAL | Status: DC
  Filled 2012-11-13: qty 20

## 2012-11-13 NOTE — Progress Notes (Signed)
PULMONARY  / CRITICAL CARE MEDICINE  Name: Walter Frazier MRN: [id number] DOB: Mar 17, 1956    ADMISSION DATE:  10/30/2012 CONSULTATION DATE:  10/30/2012  REFERRING MD :  Diona Browner PRIMARY SERVICE: PCCM  CHIEF COMPLAINT:  Cardiac arrest  BRIEF PATIENT DESCRIPTION: 57 y/o male smoker with no known past medical history was admitted to [facility] on 5/29 after cardiac arrest from a STEMI.  He was comatose afterwards and was started on the hypothermia protocol and transferred to [facility].  SIGNIFICANT EVENTS / STUDIES:  5/29 - LHC > DES to prox LAD @ [facility]. IABP placed 5/29 - Transfer to [facility] for hypothermia 5/30 - Echo >> EF 25%, LV severely dilated 6/02 - RN noted ?myoclonus - sedation infusions resumed. Purposeful on WUA but not F/C.  6/02 - Neuro consult: EEG > non-convulsive status epilepticus. Resolution of these discharges after ativan. Loaded with fosphenytoin.  6/02 - CT head: NAD 6/03 - 24 hr EEG:  Diffuse slowing, suggestive of general encephalopathy 6/04 - Started on Keppra 6/06 - daily progress neuro wise, severe agiation 6/07 - weaned for 1 hour on PSV, agitation limiting factor 6/09 Passed SBT, extubated, tolerated approx 3 hrs. Very agitated, tachypneic despite dexmedetomidine. Reintubated. Trach planned 6/10 Trach tube placed 6/11 Foley removed. Urinary retention. Foley replaced. Urethral discharge noted. Cloudy urine. Ceftrx started for UTI  LINES / TUBES: IABP 5/29 >> 6/01 ETT 5/29 >> 6/09, 6/09 >> 6/10 L IJ CVL 5/29 >> 6/10 L radial A-line 5/29 >> 6/4 Trach 6/10 >>  PICC 6/10 >>   CULTURES: MRSA PCR 5/29 >> neg  ANTIBIOTICS: Ceftrx 6/11 >> 6/14 (ordered)  SUBJ/Overnight: Still intermittently very agitated. RASS -1 to +3  PHYSICAL EXAMINATION: Gen: Intermittently agitated HEENT: Poor dentition, o/w WNL PULM: scattered rhonchi CV: RRR s M AB: BS+, soft, nontender, no hsm Ext: warm/dry, no edema Neuro: moves all ext with full strength, but no follow  commands  LABS: BMET    Component Value Date/Time   NA 141 11/11/2012 0456   K 4.2 11/11/2012 0456   CL 106 11/11/2012 0456   CO2 27 11/11/2012 0456   GLUCOSE 138* 11/11/2012 0456   BUN 23 11/11/2012 0456   CREATININE 0.70 11/11/2012 0456   CALCIUM 8.8 11/11/2012 0456   GFRNONAA >90 11/11/2012 0456   GFRAA >90 11/11/2012 0456     CBC    Component Value Date/Time   WBC 16.8* 11/11/2012 0456   RBC 3.94* 11/11/2012 0456   HGB 12.4* 11/11/2012 0456   HCT 36.6* 11/11/2012 0456   PLT 455* 11/11/2012 0456   MCV 92.9 11/11/2012 0456   MCH 31.5 11/11/2012 0456   MCHC 33.9 11/11/2012 0456   RDW 13.2 11/11/2012 0456   LYMPHSABS 2.1 11/08/2012 0500   MONOABS 1.3* 11/08/2012 0500   EOSABS 0.4 11/08/2012 0500   BASOSABS 0.1 11/08/2012 0500     ABG    Component Value Date/Time   PHART 7.437 11/11/2012 0430   PCO2ART 41.1 11/11/2012 0430   PO2ART 90.0 11/11/2012 0430   HCO3 27.7* 11/11/2012 0430   TCO2 29 11/11/2012 0430   ACIDBASEDEF 0.6 11/03/2012 0645   O2SAT 97.0 11/11/2012 0430     Recent Labs Lab 11/12/12 0455 11/12/12 0747 11/12/12 1640 11/12/12 2353 11/13/12 0833  GLUCAP 103* 128* 144* 116* 158*    CXR: no new film  ASSESSMENT / PLAN:  CARDIOVASCULAR A:  Cardiac arrest - due to recurrent VT Anterior wall STEMI 5/29, prox LAD occlusion - s/p DES PCI @ [facility] Cardiogenic shock -  resolved.   HTN  P:  -continue current Rx -Cards following  PULMONARY A:  Acute respiratory failure in the setting of cardiac arrest LLL Airspace Disease - atelectasis, resolved Failed extubation 6/09 Tracheostomy status P:   -Wean to ATC as tolerated   NEUROLOGIC A:   Post anoxic encephalopathy - severe. Prognosis guarded Status epilepticus, resolved Severe agitation - improved  P:   -Cont anticonvulsants - phenytoin and VPA -Cont fentanyl patch -Cont scheduled clonazepam - started 6/11, increased 6/12 -Cont PRN fentanyl -Cont clonidine patch as adjunct  -Add quetiapine 6/12    RENAL A: Hyponatremia, resolved Hypokalemia, resolved P:   -Correct electrolytes as indicated   GASTROINTESTINAL A:  Tolerating TF P:   -Cont PPI -Cont  TFs -Cont free water   HEMATOLOGIC A:   DVT prevention P:  - Cont LMWH   INFECTIOUS Leukocytosis Presumed UTI  P:   -micro and abx as above   35 mins CCM. Wife and sister updated   Billy Fischer, MD ; [facility] service [place] [phone].  After 5:30 PM or weekends, call [phone]

## 2012-11-13 NOTE — Progress Notes (Signed)
Weaning pt to trach collar

## 2012-11-14 DIAGNOSIS — Z930 Tracheostomy status: Secondary | ICD-10-CM

## 2012-11-14 DIAGNOSIS — N390 Urinary tract infection, site not specified: Secondary | ICD-10-CM

## 2012-11-14 HISTORY — DX: Tracheostomy status: Z93.0

## 2012-11-14 LAB — COMPREHENSIVE METABOLIC PANEL
ALT: 147 U/L — ABNORMAL HIGH (ref 0–53)
AST: 113 U/L — ABNORMAL HIGH (ref 0–37)
Alkaline Phosphatase: 340 U/L — ABNORMAL HIGH (ref 39–117)
CO2: 28 mEq/L (ref 19–32)
Calcium: 8.6 mg/dL (ref 8.4–10.5)
Chloride: 104 mEq/L (ref 96–112)
GFR calc non Af Amer: >90 mL/min (ref >90)
Potassium: 3.8 mEq/L (ref 3.5–5.1)
Sodium: 140 mEq/L (ref 135–145)

## 2012-11-14 LAB — CBC
MCH: 31.7 pg (ref 26.0–34.0)
Platelets: 571 10*3/uL — ABNORMAL HIGH (ref 150–400)
RBC: 3.98 MIL/uL — ABNORMAL LOW (ref 4.22–5.81)
WBC: 15.5 10*3/uL — ABNORMAL HIGH (ref 4.0–10.5)

## 2012-11-14 LAB — URINE CULTURE: Colony Count: >=100000

## 2012-11-14 LAB — GLUCOSE, CAPILLARY: Glucose-Capillary: 110 mg/dL — ABNORMAL HIGH (ref 70–99)

## 2012-11-14 MED ORDER — METOPROLOL TARTRATE 25 MG PO TABS
25.0000 mg | ORAL_TABLET | Freq: Two times a day (BID) | ORAL | Status: DC
  Administered 2012-11-14 – 2012-11-17 (×7): 25 mg
  Filled 2012-11-14 (×8): qty 1

## 2012-11-14 MED ORDER — ALTEPLASE 2 MG IJ SOLR
2.0000 mg | Freq: Once | INTRAMUSCULAR | Status: AC
  Administered 2012-11-14: 2 mg
  Filled 2012-11-14: qty 2

## 2012-11-14 MED ORDER — METOPROLOL TARTRATE 25 MG PO TABS: 25.0000 mg | ORAL_TABLET | ORAL | Status: DC

## 2012-11-14 MED ORDER — FENTANYL CITRATE 0.05 MG/ML IJ SOLN
25.0000 ug | INTRAMUSCULAR | Status: DC | PRN
  Administered 2012-11-14 – 2012-11-15 (×3): 50 ug via INTRAVENOUS
  Filled 2012-11-14 (×3): qty 2

## 2012-11-14 MED ORDER — ENALAPRIL MALEATE 5 MG PO TABS
5.0000 mg | ORAL_TABLET | Freq: Two times a day (BID) | ORAL | Status: DC
  Administered 2012-11-14 – 2012-11-20 (×14): 5 mg
  Filled 2012-11-14 (×17): qty 1

## 2012-11-14 NOTE — Progress Notes (Signed)
SUBJECTIVE:  Opens eyes to voice and touch, no directed movements seen. Pt has been very agitated despite sedation.   PHYSICAL EXAM Filed Vitals:   11/14/12 0500 11/14/12 0600 11/14/12 0700 11/14/12 0725  BP: 118/73 105/64 123/76   Pulse:   108   Temp:   99 F (37.2 C) 99 F (37.2 C)  TempSrc:   Oral Oral  Resp: 22 22 20    Height:      Weight:      SpO2: 96% 97% 100%    General: Well developed, well nourished, male in no acute distress Head: Eyes PERRLA, No xanthomas.   Normocephalic and atraumatic  Lungs: Clear bilaterally to auscultation. Heart: HRRR S1 S2, without MRG.  Pulses are 2+ & equal. No JVD.  Abdomen: Bowel sounds are present, abdomen soft and non-tender without masses or  hernias noted. Msk: Normal strength and tone for age. Extremities: No clubbing, cyanosis or edema.    Skin:  No rashes or lesions noted. Neuro: Alert and oriented X 3. Psych:  Good affect, responds appropriately  LABS: Results for orders placed during the hospital encounter of 10/30/12 (from the past 24 hour(s))  GLUCOSE, CAPILLARY     Status: Abnormal   Collection Time    11/13/12  8:33 AM      Result Value Range   Glucose-Capillary 158 (*) 70 - 99 mg/dL  GLUCOSE, CAPILLARY     Status: None   Collection Time    11/13/12  4:18 PM      Result Value Range   Glucose-Capillary 91  70 - 99 mg/dL  GLUCOSE, CAPILLARY     Status: Abnormal   Collection Time    11/13/12 11:44 PM      Result Value Range   Glucose-Capillary 142 (*) 70 - 99 mg/dL  CBC     Status: Abnormal   Collection Time    11/14/12  4:45 AM      Result Value Range   WBC 15.5 (*) 4.0 - 10.5 K/uL   RBC 3.98 (*) 4.22 - 5.81 MIL/uL   Hemoglobin 12.6 (*) 13.0 - 17.0 g/dL   HCT 57.8 (*) 46.9 - 62.9 %   MCV 89.9  78.0 - 100.0 fL   MCH 31.7  26.0 - 34.0 pg   MCHC 35.2  30.0 - 36.0 g/dL   RDW 52.8  41.3 - 24.4 %   Platelets 571 (*) 150 - 400 K/uL  COMPREHENSIVE METABOLIC PANEL     Status: Abnormal   Collection Time   11/14/12  4:45 AM      Result Value Range   Sodium 140  135 - 145 mEq/L   Potassium 3.8  3.5 - 5.1 mEq/L   Chloride 104  96 - 112 mEq/L   CO2 28  19 - 32 mEq/L   Glucose, Bld 128 (*) 70 - 99 mg/dL   BUN 19  6 - 23 mg/dL   Creatinine, Ser 0.10  0.50 - 1.35 mg/dL   Calcium 8.6  8.4 - 27.2 mg/dL   Total Protein 7.0  6.0 - 8.3 g/dL   Albumin 2.3 (*) 3.5 - 5.2 g/dL   AST 536 (*) 0 - 37 U/L   ALT 147 (*) 0 - 53 U/L   Alkaline Phosphatase 340 (*) 39 - 117 U/L   Total Bilirubin 0.3  0.3 - 1.2 mg/dL   GFR calc non Af Amer >90  >90 mL/min   GFR calc Af Amer >90  >90 mL/min  GLUCOSE,  CAPILLARY     Status: Abnormal   Collection Time    11/14/12  7:28 AM      Result Value Range   Glucose-Capillary 110 (*) 70 - 99 mg/dL    Intake/Output Summary (Last 24 hours) at 11/14/12 0806 Last data filed at 11/14/12 0700  Gross per 24 hour  Intake   1880 ml  Output   1445 ml  Net    435 ml   ASSESSMENT AND PLAN: 57 yo male with no previous cardiac history was admitted 5/29 post-arrest from anterior STEMI, s/p DES prox LAD, EF 25%. S/P hypothermia protocol but agitation (encephalopathy) limits vent wean. S/P trach 6/10, on tube feeds, plan is for [facility]   STEMI (ST elevation myocardial infarction) - anterior: On ASA, statin, lisinopril 2.5 bid, Lopressor 25 bid. Meds are all per tube now. SBP was 90s at times, better now. He is on a catapres patch 0.1 mg (not a home med), which was added for agitation. Will discuss with CCM if this is best option, considering it may limit [place] up-titrating his BB/ACE.  Otherwise, per CCM  Principal Problem:  Cardiac arrest  Active Problems:  V-tach  Acute respiratory failure with hypoxia  Encephalopathy acute  Status epilepticus  We will continue to follow, call over the weekend with questions.    Walter Frazier 11/14/2012 8:06 AM  History and all data above reviewed.  Patient examined.  I agree with the findings as above.  Opens eyes and tracks.  However, no  purposeful movement.  The patient exam reveals COR:RRR  ,  Lungs: Clear  ,  Abd: Positive bowel sounds, no rebound no guarding , Ext No edema  .  All available labs, radiology testing, previous records reviewed. Agree with documented assessment and plan. Trying to titrate the meds for ischemic cardiomyopathy slowly as above.    Walter Frazier  9:05 AM  11/14/2012

## 2012-11-14 NOTE — Evaluation (Addendum)
Speech Language Pathology Evaluation Patient Details Name: Walter Frazier MRN: [id number] DOB: 24-Apr-1956 Today's Date: 11/14/2012 Time: 7829-5621 SLP Time Calculation (min): 7 min  Problem List:  Patient Active Problem List   Diagnosis Date Noted  . UTI (lower urinary tract infection) 11/14/2012  . 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 Status epilepticus 11/03/2012  . Cardiac arrest 10/31/2012  . STEMI (ST elevation myocardial infarction) 10/31/2012  . V-tach 10/31/2012  . Acute respiratory failure with hypoxia 10/31/2012  . Encephalopathy acute 10/31/2012   Past Medical History: History reviewed. No pertinent past medical history. Past Surgical History: History reviewed. No pertinent past surgical history. HPI:  58 y/o male smoker with no known past medical history was admitted to [facility] on 5/29 after cardiac arrest from a STEMI. He was comatose afterwards and was started on the hypothermia protocol and transferred to [facility].   Assessment / Plan / Recommendation Clinical Impression  Assessement very brief and limited as pt. lethargic and received sedatives.  Pt. opened eyes 2 out of 3 times in response to hearing name and able to maintain for approximately 5-8 seconds.  No response to yes/ no questions with head or hand when provided max verbal cues.  Janina Mayo is cuffed and inflated therefore unable to transit air to upper airway.  Pt. will continue to be followed by ST for continued diagnostic therapy as alertness improves, restlessness decreases.  RN reports he has a strong cough.  Feel he is a good candidate for a Passy-Muir speaking valve sometime beginning of next week.      SLP Assessment  Patient needs continued Speech Lanaguage Pathology Services    Follow Up Recommendations  Inpatient Rehab    Frequency and Duration min 2x/week  2 weeks   Pertinent Vitals/Pain none   SLP Goals  SLP Goals Potential to Achieve Goals: Good Progress/Goals/Alternative treatment plan discussed  with pt/caregiver and they: Patient unable to parrticipate in goal setting SLP Goal #1: Pt. will maintain sustained attention with eye opening for 5 minutes with min cues. SLP Goal #2: Pt. will follow 1 step commands with 79% accuracy without cues. SLP Goal #3: Pt. will demonstrate functional problem solving with moderate verbal/visual cues.  SLP Evaluation Prior Functioning  Cognitive/Linguistic Baseline: Information not available (assume WFL)   Cognition  Overall Cognitive Status: Impaired/Different from baseline Arousal/Alertness: Lethargic Orientation Level:  (unable to assess due to lethargy) Attention: Focused Focused Attention: Impaired Focused Attention Impairment: Verbal basic Memory:  (unable to assess due to lethargy) Awareness: Impaired Awareness Impairment: Intellectual impairment;Emergent impairment;Anticipatory impairment Problem Solving: Impaired Problem Solving Impairment: Functional basic Behaviors: Restless Safety/Judgment: Impaired    Comprehension  Auditory Comprehension Overall Auditory Comprehension:  (did not follow commands) Yes/No Questions:  (no response) Commands:  (not follow commands) Interfering Components: Attention Reading Comprehension Reading Status: Not tested    Expression Verbal Expression Overall Verbal Expression:  (trach with cuff inflated, no mouthing) Initiation: Impaired Level of Generative/Spontaneous Verbalization:  (n/a) Written Expression Written Expression: Not tested   Oral / Motor Oral Motor/Sensory Function Overall Oral Motor/Sensory Function:  (TBA) Motor Speech Overall Motor Speech:  (TBA)   GO     Breck Coons [place].Ed [employer] [phone]   11/14/2012

## 2012-11-14 NOTE — Progress Notes (Signed)
Patient ID: Walter Frazier, male   DOB: 01/10/1956, 57 y.o.   MRN: [id number]    Request has been received for placement of percutaneous gastric tube  Anoxic encephalopathy Post STEMI cardiac arrest- new stent- on Plavix Status epilepticus Trach dependent Deconditioning  Temp: 100.3 6/14 am Wbc 15.5 6/14- not on steroids  We will follow pt over weekend Need afebrile and wbc wnl to place  Perc g tube safely We will hold Lovenox for procedure  Unable to come off Plavix secondary to new cardiac stent  PA to evaluate pt Sun; if wbc trending down and afeb May be able to move forward Mon or early next week

## 2012-11-14 NOTE — Progress Notes (Addendum)
PULMONARY  / CRITICAL CARE MEDICINE  Name: Walter Frazier MRN: [id number] DOB: [birth year]    ADMISSION DATE:  10/30/2012 CONSULTATION DATE:  10/30/2012  REFERRING MD :  Diona Browner PRIMARY SERVICE: PCCM  CHIEF COMPLAINT:  Cardiac arrest  BRIEF PATIENT DESCRIPTION: 57 y/o male smoker with no known past medical history was admitted to [facility] on 5/29 after cardiac arrest from a STEMI.  He was comatose afterwards and was started on the hypothermia protocol and transferred to [facility].  SIGNIFICANT EVENTS / STUDIES:  5/29 - LHC > DES to prox LAD @ [facility]. IABP placed 5/29 - Transfer to [facility] for hypothermia 5/30 - Echo >> EF 25%, LV severely dilated 6/02 - RN noted ?myoclonus - sedation infusions resumed. Purposeful on WUA but not F/C.  6/02 - Neuro consult: EEG > non-convulsive status epilepticus. Resolution of these discharges after ativan. Loaded with fosphenytoin.  6/02 - CT head: NAD 6/03 - 24 hr EEG:  Diffuse slowing, suggestive of general encephalopathy 6/04 - Started on Keppra 6/06 - daily progress neuro wise, severe agiation 6/07 - weaned for 1 hour on PSV, agitation limiting factor 6/09 Passed SBT, extubated, tolerated approx 3 hrs. Very agitated, tachypneic despite dexmedetomidine. Reintubated. Trach planned 6/10 Trach tube placed 6/11 Foley removed. Urinary retention. Foley replaced. Urethral discharge noted. Cloudy urine. Ceftrx started for UTI 6/13 Transfer to SDU   LINES / TUBES: IABP 5/29 >> 6/01 ETT 5/29 >> 6/09, 6/09 >> 6/10 L IJ CVL 5/29 >> 6/10 L radial A-line 5/29 >> 6/4 Trach 6/10 >>  PICC 6/10 >>   CULTURES: MRSA PCR 5/29 >> neg Urine 6/12 >> E coli  ANTIBIOTICS: Ceftrx 6/11 >> 6/14 (ordered)  SUBJ/Overnight: Improved agitation.   PHYSICAL EXAMINATION: Gen: Intermittently agitated HEENT: Poor dentition, o/w WNL PULM: scattered rhonchi CV: RRR s M AB: BS+, soft, nontender, no hsm Ext: warm/dry, no edema Neuro: moves all ext with full strength, stares  blankly, will not follow commands  LABS: BMET    Component Value Date/Time   NA 140 11/14/2012 0445   K 3.8 11/14/2012 0445   CL 104 11/14/2012 0445   CO2 28 11/14/2012 0445   GLUCOSE 128* 11/14/2012 0445   BUN 19 11/14/2012 0445   CREATININE 0.59 11/14/2012 0445   CALCIUM 8.6 11/14/2012 0445   GFRNONAA >90 11/14/2012 0445   GFRAA >90 11/14/2012 0445     CBC    Component Value Date/Time   WBC 15.5* 11/14/2012 0445   RBC 3.98* 11/14/2012 0445   HGB 12.6* 11/14/2012 0445   HCT 35.8* 11/14/2012 0445   PLT 571* 11/14/2012 0445   MCV 89.9 11/14/2012 0445   MCH 31.7 11/14/2012 0445   MCHC 35.2 11/14/2012 0445   RDW 12.8 11/14/2012 0445   LYMPHSABS 2.1 11/08/2012 0500   MONOABS 1.3* 11/08/2012 0500   EOSABS 0.4 11/08/2012 0500   BASOSABS 0.1 11/08/2012 0500     ABG    Component Value Date/Time   PHART 7.437 11/11/2012 0430   PCO2ART 41.1 11/11/2012 0430   PO2ART 90.0 11/11/2012 0430   HCO3 27.7* 11/11/2012 0430   TCO2 29 11/11/2012 0430   ACIDBASEDEF 0.6 11/03/2012 0645   O2SAT 97.0 11/11/2012 0430     Recent Labs Lab 11/12/12 2353 11/13/12 0833 11/13/12 1618 11/13/12 2344 11/14/12 0728  GLUCAP 116* 158* 91 142* 110*    CXR: no new film  ASSESSMENT / PLAN:  CARDIOVASCULAR A:  Cardiac arrest 5/29   VT/VF Anterior wall STEMI 5/29, prox LAD occlusion  s/p DES PCI @ [facility] Cardiogenic shock, resolved.   HTN  P:  -continue current Rx -Cards following  PULMONARY A:  Acute respiratory failure post cardiac arrest LLL Airspace Disease - atelectasis, resolved Failed extubation 6/09 Tracheostomy status P:   -Cont ATC as tolerated   NEUROLOGIC A:   Post anoxic encephalopathy - severe. Status epilepticus, resolved Severe agitation - improved Apparent aphasia  P:   -Cont anticonvulsants - phenytoin and VPA -Cont fentanyl patch -Cont scheduled clonazepam - started 6/11, increased 6/12 -Cont PRN fentanyl - dose adjusted -Cont clonidine patch as adjunct  -Cont quetiapine 6/12   -SLP for neurocognitive eval   RENAL A: Hyponatremia, resolved Hypokalemia, resolved P:   -Correct electrolytes as indicated  GASTROINTESTINAL A:  Tolerating TF P:   -Cont PPI -Cont  TFs -Cont free water -G tube ordered 6/13  HEMATOLOGIC A:   DVT prevention P:  - Cont LMWH   INFECTIOUS E coli UTI  P:   -micro and abx as above     Billy Fischer, MD ; [facility] service Mobile [phone].  After 5:30 PM or weekends, call [phone]

## 2012-11-14 NOTE — Progress Notes (Signed)
[name] TEAM Patient Details Name: Walter Frazier MRN: [id number] DOB: 17-Oct-1955 Today's Date: 11/14/2012 Time:  -     SLP reviewd chart as part of Trach Team.  Pt. Tolerated trach collar most of the day yesterday and placed on TC this morning.  MD, please order a Passy-Muir speaking valve if/when pt. Is appropriate to facilitate verbal expression, assist with secretion management.  Thank you.      Breck Coons [place].Ed [employer] [phone]  11/14/2012

## 2012-11-15 LAB — GLUCOSE, CAPILLARY
Glucose-Capillary: 123 mg/dL — ABNORMAL HIGH (ref 70–99)
Glucose-Capillary: 126 mg/dL — ABNORMAL HIGH (ref 70–99)
Glucose-Capillary: 130 mg/dL — ABNORMAL HIGH (ref 70–99)

## 2012-11-15 MED ORDER — FENTANYL CITRATE 0.05 MG/ML IJ SOLN
25.0000 ug | INTRAMUSCULAR | Status: DC | PRN
  Administered 2012-11-15 – 2012-11-24 (×11): 25 ug via INTRAVENOUS
  Filled 2012-11-15 (×11): qty 2

## 2012-11-15 MED ORDER — CLONAZEPAM 0.1 MG/ML ORAL SUSPENSION
1.0000 mg | Freq: Two times a day (BID) | ORAL | Status: DC
  Filled 2012-11-15 (×2): qty 10

## 2012-11-15 MED ORDER — QUETIAPINE FUMARATE 100 MG PO TABS
100.0000 mg | ORAL_TABLET | Freq: Every day | ORAL | Status: DC
  Administered 2012-11-16 – 2012-11-20 (×5): 100 mg via ORAL
  Filled 2012-11-15 (×6): qty 1

## 2012-11-15 MED ORDER — FENTANYL 50 MCG/HR TD PT72
50.0000 ug | MEDICATED_PATCH | TRANSDERMAL | Status: DC
  Administered 2012-11-17: 50 ug via TRANSDERMAL
  Filled 2012-11-15: qty 1

## 2012-11-15 MED ORDER — CLONAZEPAM 1 MG PO TABS
1.0000 mg | ORAL_TABLET | Freq: Two times a day (BID) | ORAL | Status: DC
  Administered 2012-11-15 – 2012-11-17 (×4): 1 mg
  Filled 2012-11-15 (×4): qty 1

## 2012-11-15 NOTE — Progress Notes (Signed)
Patient beginning to show signs of fatigue. Increasing HR slowly and RR increased overnight from 16-17 to 30-32. SATs ranging from 94-98%. Patient had Diminished BBS throughout and not moving great air. Placed back on Vent with previous modes and will attempt to wean again in the Am.

## 2012-11-15 NOTE — Progress Notes (Signed)
Patient ID: Walter Frazier, male   DOB: November 22, 1955, 57 y.o.   MRN: [id number] See Dr. Jenene Slicker note yesterday.  Per family, he is opening his eyes more.  On trach collar, stable hemodynamically.  We will see again on Monday unless there are clinical changes.   Marca Ancona 11/15/2012 10:43 AM

## 2012-11-15 NOTE — Progress Notes (Signed)
PULMONARY  / CRITICAL CARE MEDICINE  Name: Walter Frazier MRN: [id number] DOB: May 19, 1956    ADMISSION DATE:  10/30/2012 CONSULTATION DATE:  10/30/2012  REFERRING MD :  Diona Browner PRIMARY SERVICE: PCCM  CHIEF COMPLAINT:  Cardiac arrest  BRIEF PATIENT DESCRIPTION: 57 y/o male smoker with no known past medical history was admitted to [facility] on 5/29 after cardiac arrest from a STEMI.  He was comatose afterwards and was started on the hypothermia protocol and transferred to [facility].  SIGNIFICANT EVENTS / STUDIES:  5/29 - LHC > DES to prox LAD @ [facility]. IABP placed 5/29 - Transfer to [facility] for hypothermia 5/30 - Echo >> EF 25%, LV severely dilated 6/02 - RN noted ?myoclonus - sedation infusions resumed. Purposeful on WUA but not F/C.  6/02 - Neuro consult: EEG > non-convulsive status epilepticus. Resolution of these discharges after ativan. Loaded with fosphenytoin.  6/02 - CT head: NAD 6/03 - 24 hr EEG:  Diffuse slowing, suggestive of general encephalopathy 6/04 - Started on Keppra 6/06 - daily progress neuro wise, severe agiation 6/07 - weaned for 1 hour on PSV, agitation limiting factor 6/09 Passed SBT, extubated, tolerated approx 3 hrs. Very agitated, tachypneic despite dexmedetomidine. Reintubated. Trach planned 6/10 Trach tube placed 6/11 Foley removed. Urinary retention. Foley replaced. Urethral discharge noted. Cloudy urine. Ceftrx started for UTI 6/13 Transfer to SDU 6/14 Following commands!!!  LINES / TUBES: IABP 5/29 >> 6/01 ETT 5/29 >> 6/09, 6/09 >> 6/10 L IJ CVL 5/29 >> 6/10 L radial A-line 5/29 >> 6/4 Trach 6/10 >>  PICC 6/10 >>   CULTURES: MRSA PCR 5/29 >> neg Urine 6/12 >> E coli  ANTIBIOTICS: Ceftrx 6/11 >> 6/14 (ordered)  SUBJ/Overnight: Improved agitation. Able to follow commands!  PHYSICAL EXAMINATION: Gen: NAD HEENT: Poor dentition, o/w WNL PULM: scattered rhonchi CV: RRR s M AB: BS+, soft, nontender, no hsm Ext: warm/dry, no edema Neuro: moves all ext  with full strength, + F/C  LABS: BMET    Component Value Date/Time   NA 140 11/14/2012 0445   K 3.8 11/14/2012 0445   CL 104 11/14/2012 0445   CO2 28 11/14/2012 0445   GLUCOSE 128* 11/14/2012 0445   BUN 19 11/14/2012 0445   CREATININE 0.59 11/14/2012 0445   CALCIUM 8.6 11/14/2012 0445   GFRNONAA >90 11/14/2012 0445   GFRAA >90 11/14/2012 0445     CBC    Component Value Date/Time   WBC 15.5* 11/14/2012 0445   RBC 3.98* 11/14/2012 0445   HGB 12.6* 11/14/2012 0445   HCT 35.8* 11/14/2012 0445   PLT 571* 11/14/2012 0445   MCV 89.9 11/14/2012 0445   MCH 31.7 11/14/2012 0445   MCHC 35.2 11/14/2012 0445   RDW 12.8 11/14/2012 0445   LYMPHSABS 2.1 11/08/2012 0500   MONOABS 1.3* 11/08/2012 0500   EOSABS 0.4 11/08/2012 0500   BASOSABS 0.1 11/08/2012 0500     ABG    Component Value Date/Time   PHART 7.437 11/11/2012 0430   PCO2ART 41.1 11/11/2012 0430   PO2ART 90.0 11/11/2012 0430   HCO3 27.7* 11/11/2012 0430   TCO2 29 11/11/2012 0430   ACIDBASEDEF 0.6 11/03/2012 0645   O2SAT 97.0 11/11/2012 0430     Recent Labs Lab 11/14/12 0728 11/14/12 1559 11/14/12 2340 11/15/12 0832 11/15/12 1616  GLUCAP 110* 102* 123* 130* 126*    CXR: no new film  ASSESSMENT / PLAN:  CARDIOVASCULAR A:  Cardiac arrest 5/29 - VT/VF Anterior wall STEMI 5/29, prox LAD occlusion - s/p DES  PCI @ [facility] Cardiogenic shock, resolved.   HTN  P:  -continue current Rx -Cards following  PULMONARY A:  Acute respiratory failure post cardiac arrest LLL Airspace Disease - atelectasis, resolved Failed extubation 6/09 Tracheostomy status P:   -Cont ATC as tolerated   NEUROLOGIC A:   Post anoxic encephalopathy - improving Status epilepticus, resolved Severe agitation - improved Apparent aphasia, resolved   P:   -Cont anticonvulsants - phenytoin and VPA -reduce fentanyl patch -Cont scheduled clonazepam - started 6/11, increased 6/12, reduced 6/14 -Cont PRN fentanyl  -Cont clonidine patch as adjunct  -Cont quetiapine  6/12 - reduced 6/13  RENAL A: Hyponatremia, resolved Hypokalemia, resolved P:   -Correct electrolytes as indicated  GASTROINTESTINAL A:  Tolerating TF P:   -Cont PPI -Cont  TFs -Cont free water -G tube ordered 6/13, planned 6/16   HEMATOLOGIC A:   DVT prevention P:  - Cont LMWH   INFECTIOUS E coli UTI  P:   -micro and abx as above     Billy Fischer, MD ; [facility] service Mobile [phone].  After 5:30 PM or weekends, call [phone]

## 2012-11-16 ENCOUNTER — Encounter (HOSPITAL_COMMUNITY): Payer: Self-pay | Admitting: Radiology

## 2012-11-16 LAB — GLUCOSE, CAPILLARY
Glucose-Capillary: 111 mg/dL — ABNORMAL HIGH (ref 70–99)
Glucose-Capillary: 114 mg/dL — ABNORMAL HIGH (ref 70–99)

## 2012-11-16 MED ORDER — CEFAZOLIN SODIUM-DEXTROSE 2-3 GM-% IV SOLR
2.0000 g | INTRAVENOUS | Status: AC
  Administered 2012-11-16: 2 g via INTRAVENOUS
  Filled 2012-11-16: qty 50

## 2012-11-16 NOTE — H&P (Signed)
HPI: Walter Frazier is an 57 y.o. male with recent Cardiac arrest and MI. Resp failure s/p tracheostomy, making slow progress. Currently tolerating TF via NGT, but IR is requested to place perc G-tube for expected prolonged dysphagia and PCM during recovery. Pt is on Plavix for recent Cardiac events and cannot be taken off for procedures. PMHx and meds reviewed. Recent fevers have resolved.  Past Medical History: History reviewed. No pertinent past medical history.  Past Surgical History: History reviewed. No pertinent past surgical history.  Family History: No family history on file.  Social History:  reports that he has been smoking Cigarettes.  He has been smoking about 1.00 pack per day. He does not have any smokeless tobacco history on file. He reports that he drinks about 6.0 ounces of alcohol per week. He reports that he does not use illicit drugs.  Allergies: No Known Allergies  Medications: Current facility-administered medications:0.9 %  sodium chloride infusion, , Intravenous, Continuous, Lupita Leash, MD, Last Rate: 20 mL/hr at 11/15/12 0700;  acetaminophen (TYLENOL) tablet 650 mg, 650 mg, Per NG tube, Q4H PRN, Alyson Reedy, MD, 650 mg at 11/13/12 0945;  antiseptic oral rinse (BIOTENE) solution 15 mL, 15 mL, Mouth Rinse, QID, Jonelle Sidle, MD, 15 mL at 11/16/12 0305 aspirin chewable tablet 81 mg, 81 mg, Per Tube, Daily, Merwyn Katos, MD, 81 mg at 11/16/12 0900;  atorvastatin (LIPITOR) tablet 80 mg, 80 mg, Oral, q1800, Merwyn Katos, MD, 80 mg at 11/15/12 1731;  [START ON 11/17/2012] ceFAZolin (ANCEF) IVPB 2 g/50 mL premix, 2 g, Intravenous, On Call, Brayton El, PA-C;  chlorhexidine (PERIDEX) 0.12 % solution 15 mL, 15 mL, Mouth Rinse, BID, Jonelle Sidle, MD, 15 mL at 11/16/12 0857 clonazePAM (KLONOPIN) tablet 1 mg, 1 mg, Per Tube, BID, Lupita Leash, MD, 1 mg at 11/16/12 4098;  cloNIDine (CATAPRES - Dosed in mg/24 hr) patch 0.1 mg, 0.1 mg, Transdermal,  Weekly, Merwyn Katos, MD, 0.1 mg at 11/13/12 1225;  clopidogrel (PLAVIX) tablet 75 mg, 75 mg, Per Tube, Q breakfast, Merwyn Katos, MD, 75 mg at 11/16/12 0858;  enalapril (VASOTEC) tablet 5 mg, 5 mg, Per Tube, BID, Rhonda G Barrett, PA-C, 5 mg at 11/16/12 0858 enoxaparin (LOVENOX) injection 40 mg, 40 mg, Subcutaneous, Daily, Pricilla Riffle, MD, 40 mg at 11/16/12 0858;  feeding supplement (JEVITY 1.2 CAL) liquid 1,000 mL, 1,000 mL, Per Tube, Continuous, Tonye Becket, RD, Last Rate: 45 mL/hr at 11/15/12 1337, 1,000 mL at 11/15/12 1337;  feeding supplement (PRO-STAT SUGAR FREE 64) liquid 30 mL, 30 mL, Per Tube, QID, Tonye Becket, RD, 30 mL at 11/16/12 0857 [START ON 11/17/2012] fentaNYL (DURAGESIC - dosed mcg/hr) 50 mcg, 50 mcg, Transdermal, Q72H, Merwyn Katos, MD;  fentaNYL (SUBLIMAZE) injection 25 mcg, 25 mcg, Intravenous, Q2H PRN, Merwyn Katos, MD, 25 mcg at 11/16/12 0749;  free water 200 mL, 200 mL, Per Tube, Q8H, Merwyn Katos, MD, 200 mL at 11/16/12 0554;  hydrALAZINE (APRESOLINE) injection 10-40 mg, 10-40 mg, Intravenous, Q4H PRN, Merwyn Katos, MD levalbuterol Pauline Aus) nebulizer solution 0.63 mg, 0.63 mg, Nebulization, Q3H PRN, Merwyn Katos, MD;  LORazepam (ATIVAN) injection 0.5-1 mg, 0.5-1 mg, Intravenous, Q4H PRN, Merwyn Katos, MD, 1 mg at 11/15/12 2340;  metoprolol (LOPRESSOR) injection 2.5-5 mg, 2.5-5 mg, Intravenous, Q3H PRN, Merwyn Katos, MD;  metoprolol tartrate (LOPRESSOR) tablet 25 mg, 25 mg, Per Tube, BID, Rhonda G Barrett, PA-C, 25 mg at 11/16/12 [phone]  phenytoin (DILANTIN) 125 MG/5ML suspension 200 mg, 200 mg, Per Tube, BID, Merwyn Katos, MD, 200 mg at 11/16/12 0858;  QUEtiapine (SEROQUEL) tablet 100 mg, 100 mg, Oral, QHS, Merwyn Katos, MD;  sodium chloride 0.9 % injection 10-40 mL, 10-40 mL, Intracatheter, Q12H, Merwyn Katos, MD, 10 mL at 11/16/12 0900;  sodium chloride 0.9 % injection 10-40 mL, 10-40 mL, Intracatheter, PRN, Merwyn Katos, MD, 10 mL at  11/14/12 0915 Valproic Acid (DEPAKENE) 250 MG/5ML syrup SYRP 500 mg, 500 mg, Per Tube, BID, Merwyn Katos, MD, 500 mg at 11/16/12 0857  Please HPI for pertinent positives, otherwise complete 10 system ROS negative.  Physical Exam: BP 92/71  Pulse 102  Temp(Src) 98 F (36.7 C) (Axillary)  Resp 22  Ht 5\' 9"  (1.753 m)  Wt 165 lb 5.5 oz (75 kg)  BMI 24.41 kg/m2  SpO2 97% Body mass index is 24.41 kg/(m^2).   General Appearance:  Awake and responsive to conversation, but did not communicate.  Head:  Normocephalic, without obvious abnormality, atraumatic  ENT: Unremarkable, NGT (L)nare  Neck: Supple, symmetrical, trachea midline  Lungs:   Few scattered rhonchi.  Heart:  Regular rate and rhythm, S1, S2 normal, no murmur, rub or gallop.  Abdomen:   Soft, non-tender, non distended.  Neurologic: Normal affect, no gross deficits.   Results for orders placed during the hospital encounter of 10/30/12 (from the past 48 hour(s))  GLUCOSE, CAPILLARY     Status: Abnormal   Collection Time    11/14/12  3:59 PM      Result Value Range   Glucose-Capillary 102 (*) 70 - 99 mg/dL  GLUCOSE, CAPILLARY     Status: Abnormal   Collection Time    11/14/12 11:40 PM      Result Value Range   Glucose-Capillary 123 (*) 70 - 99 mg/dL  GLUCOSE, CAPILLARY     Status: Abnormal   Collection Time    11/15/12  8:32 AM      Result Value Range   Glucose-Capillary 130 (*) 70 - 99 mg/dL  GLUCOSE, CAPILLARY     Status: Abnormal   Collection Time    11/15/12  4:16 PM      Result Value Range   Glucose-Capillary 126 (*) 70 - 99 mg/dL  GLUCOSE, CAPILLARY     Status: Abnormal   Collection Time    11/16/12 12:11 AM      Result Value Range   Glucose-Capillary 111 (*) 70 - 99 mg/dL  GLUCOSE, CAPILLARY     Status: Abnormal   Collection Time    11/16/12  7:36 AM      Result Value Range   Glucose-Capillary 105 (*) 70 - 99 mg/dL   No results found.  Assessment/Plan Cardiac Arrest Resp failure FTT, PCM,  dysphagia Discussed G-tube placement with pt and wife (via phone) Explained procedure, risks, complications, use of sedation. Elevated risk of bleeding due to Plavix. Will hold Lovenox day of procedure. Consent signed in chart  Brayton El PA-C 11/16/2012, 9:02 AM

## 2012-11-16 NOTE — Progress Notes (Signed)
PULMONARY  / CRITICAL CARE MEDICINE  Name: Walter Frazier MRN: [id number] DOB: [birth year]    ADMISSION DATE:  10/30/2012 CONSULTATION DATE:  10/30/2012  REFERRING MD :  Diona Browner PRIMARY SERVICE: PCCM  CHIEF COMPLAINT:  Cardiac arrest  BRIEF PATIENT DESCRIPTION: 57 y/o male smoker with no known past medical history was admitted to [facility] on 5/29 after cardiac arrest from a STEMI.  He was comatose afterwards and was started on the hypothermia protocol and transferred to [facility].  SIGNIFICANT EVENTS / STUDIES:  5/29 - LHC > DES to prox LAD @ [facility]. IABP placed 5/29 - Transfer to [facility] for hypothermia 5/30 - Echo >> EF 25%, LV severely dilated 6/02 - RN noted ?myoclonus - sedation infusions resumed. Purposeful on WUA but not F/C.  6/02 - Neuro consult: EEG > non-convulsive status epilepticus. Resolution of these discharges after ativan. Loaded with fosphenytoin.  6/02 - CT head: NAD 6/03 - 24 hr EEG:  Diffuse slowing, suggestive of general encephalopathy 6/04 - Started on Keppra 6/06 - daily progress neuro wise, severe agiation 6/07 - weaned for 1 hour on PSV, agitation limiting factor 6/09 Passed SBT, extubated, tolerated approx 3 hrs. Very agitated, tachypneic despite dexmedetomidine. Reintubated. Trach planned 6/10 Trach tube placed 6/11 Foley removed. Urinary retention. Foley replaced. Urethral discharge noted. Cloudy urine. Ceftrx started for UTI 6/13 Transfer to SDU 6/14 Following commands!!! Fent patch, clonazepam doses decreased  LINES / TUBES: IABP 5/29 >> 6/01 ETT 5/29 >> 6/09, 6/09 >> 6/10 L IJ CVL 5/29 >> 6/10 L radial A-line 5/29 >> 6/4 Trach 6/10 >>  PICC 6/10 >>  G tube 6/16 (planned) >>   CULTURES: MRSA PCR 5/29 >> neg Urine 6/12 >> E coli  ANTIBIOTICS: Ceftrx 6/11 >> 6/14  SUBJ/Overnight: RASS 0. Intermittently F/C  PHYSICAL EXAMINATION: Gen: NAD HEENT: Poor dentition, o/w WNL PULM: scattered rhonchi CV: RRR s M AB: BS+, soft, nontender, no hsm Ext:  warm/dry, no edema Neuro: moves all ext with full strength, + F/C  LABS: BMET    Component Value Date/Time   NA 140 11/14/2012 0445   K 3.8 11/14/2012 0445   CL 104 11/14/2012 0445   CO2 28 11/14/2012 0445   GLUCOSE 128* 11/14/2012 0445   BUN 19 11/14/2012 0445   CREATININE 0.59 11/14/2012 0445   CALCIUM 8.6 11/14/2012 0445   GFRNONAA >90 11/14/2012 0445   GFRAA >90 11/14/2012 0445     CBC    Component Value Date/Time   WBC 15.5* 11/14/2012 0445   RBC 3.98* 11/14/2012 0445   HGB 12.6* 11/14/2012 0445   HCT 35.8* 11/14/2012 0445   PLT 571* 11/14/2012 0445   MCV 89.9 11/14/2012 0445   MCH 31.7 11/14/2012 0445   MCHC 35.2 11/14/2012 0445   RDW 12.8 11/14/2012 0445   LYMPHSABS 2.1 11/08/2012 0500   MONOABS 1.3* 11/08/2012 0500   EOSABS 0.4 11/08/2012 0500   BASOSABS 0.1 11/08/2012 0500     ABG    Component Value Date/Time   PHART 7.437 11/11/2012 0430   PCO2ART 41.1 11/11/2012 0430   PO2ART 90.0 11/11/2012 0430   HCO3 27.7* 11/11/2012 0430   TCO2 29 11/11/2012 0430   ACIDBASEDEF 0.6 11/03/2012 0645   O2SAT 97.0 11/11/2012 0430     Recent Labs Lab 11/14/12 2340 11/15/12 0832 11/15/12 1616 11/16/12 0011 11/16/12 0736  GLUCAP 123* 130* 126* 111* 105*    CXR: no new film  ASSESSMENT / PLAN:  CARDIOVASCULAR A:  Cardiac arrest 5/29 - VT/VF Anterior wall  STEMI 5/29, prox LAD occlusion - s/p DES PCI @ [facility] Cardiogenic shock, resolved.   HTN  P:  -continue current Rx -Cards following  PULMONARY A:  Acute respiratory failure post cardiac arrest LLL Airspace Disease - atelectasis, resolved Failed extubation 6/09 Tracheostomy status P:   -Cont ATC as tolerated   NEUROLOGIC A:   Post anoxic encephalopathy - improving Status epilepticus, resolved Severe agitation - improved Apparent aphasia, resolved   P:   -Cont anticonvulsants - phenytoin and VPA -Cont fentanyl patch and low dose PRN fentanyl -Cont scheduled clonazepam - started 6/11, increased 6/12, reduced 6/14 -Cont  clonidine patch as adjunct  -Cont quetiapine 6/12 - reduced 6/13  RENAL A: Hyponatremia, resolved Hypokalemia, resolved P:   -Correct electrolytes as indicated  GASTROINTESTINAL A:  Tolerating TF P:   -SUP no longer indicated as he is liberated from vent -Cont  TFs -Cont free water -G tube per IR planned 6/16   HEMATOLOGIC A:   DVT prevention P:  - Cont LMWH   INFECTIOUS E coli UTI  P:   -micro and abx as above   After G tube placed, should be ready for LTACH  Billy Fischer, MD ; [facility] service Mobile [phone].  After 5:30 PM or weekends, call [phone]

## 2012-11-17 ENCOUNTER — Inpatient Hospital Stay (HOSPITAL_COMMUNITY): Payer: BC Managed Care – PPO

## 2012-11-17 LAB — BASIC METABOLIC PANEL
BUN: 21 mg/dL (ref 6–23)
CO2: 26 mEq/L (ref 19–32)
Calcium: 9 mg/dL (ref 8.4–10.5)
Creatinine, Ser: 0.6 mg/dL (ref 0.50–1.35)

## 2012-11-17 LAB — CBC WITH DIFFERENTIAL/PLATELET
Basophils Absolute: 0.1 10*3/uL (ref 0.0–0.1)
Basophils Relative: 1 % (ref 0–1)
Eosinophils Relative: 5 % (ref 0–5)
HCT: 36.4 % — ABNORMAL LOW (ref 39.0–52.0)
Hemoglobin: 12.6 g/dL — ABNORMAL LOW (ref 13.0–17.0)
Lymphocytes Relative: 26 % (ref 12–46)
MCHC: 34.6 g/dL (ref 30.0–36.0)
MCV: 90.1 fL (ref 78.0–100.0)
Monocytes Absolute: 1 10*3/uL (ref 0.1–1.0)
Monocytes Relative: 9 % (ref 3–12)
RDW: 12.7 % (ref 11.5–15.5)

## 2012-11-17 LAB — GLUCOSE, CAPILLARY: Glucose-Capillary: 124 mg/dL — ABNORMAL HIGH (ref 70–99)

## 2012-11-17 MED ORDER — METOPROLOL TARTRATE 25 MG/10 ML ORAL SUSPENSION
25.0000 mg | Freq: Two times a day (BID) | ORAL | Status: DC
  Administered 2012-11-17 – 2012-11-20 (×7): 25 mg
  Filled 2012-11-17 (×10): qty 10

## 2012-11-17 MED ORDER — METOPROLOL SUCCINATE ER 25 MG PO TB24: 25.0000 mg | ORAL_TABLET | Freq: Every day | ORAL | Status: DC

## 2012-11-17 MED ORDER — CLONAZEPAM 1 MG PO TABS
1.0000 mg | ORAL_TABLET | Freq: Every day | ORAL | Status: DC
  Administered 2012-11-18 – 2012-11-20 (×3): 1 mg
  Filled 2012-11-17 (×3): qty 1

## 2012-11-17 MED ORDER — CEFAZOLIN SODIUM-DEXTROSE 2-3 GM-% IV SOLR
2.0000 g | Freq: Once | INTRAVENOUS | Status: DC
  Filled 2012-11-17: qty 50

## 2012-11-17 MED ORDER — ENOXAPARIN SODIUM 40 MG/0.4ML ~~LOC~~ SOLN
40.0000 mg | Freq: Every day | SUBCUTANEOUS | Status: DC
  Administered 2012-11-17: 40 mg via SUBCUTANEOUS
  Filled 2012-11-17 (×2): qty 0.4

## 2012-11-17 NOTE — Evaluation (Signed)
Passy-Muir Speaking Valve - Evaluation Patient Details  Name: Walter Frazier MRN: [id number] Date of Birth: September 27, 1955  Today's Date: 11/17/2012 Time: 4098-1191 SLP Time Calculation (min): 23 min  Past Medical History: History reviewed. No pertinent past medical history. Past Surgical History: History reviewed. No pertinent past surgical history. HPI:  Walter Frazier is an 57 y.o. male with recent Cardiac arrest and MI. Resp failure s/p tracheostomy, making slow progress. Currently tolerating TF via NGT, but IR is requested to place perc G-tube for expected prolonged dysphagia and PCM during recovery.   Assessment / Plan / Recommendation Clinical Impression  Pt. was unable to adequately exhale around the #8 trach tube (cuff deflated), and therefore did not tolerate the PMSV.  Pt. would likely tolerate the PMSV if the trach could be downsized to #6 (trach placed 6/10).  Pt. briefly obtained phonation (very strained.).  PMSV removed and pt. and family (wife and sister) were educated to plan.      SLP Assessment  Patient needs continued Speech Lanaguage Pathology Services    Follow Up Recommendations  Inpatient Rehab    Frequency and Duration min 2x/week  2 weeks   Pertinent Vitals/Pain n/a    SLP Goals Potential to Achieve Goals: Fair Progress/Goals/Alternative treatment plan discussed with pt/caregiver and they: Agree SLP Goal #1: Pt. will reattempt PMSV once trach changed to #6.   PMSV Trial  PMSV was placed for: Less than one minute Able to redirect subglottic air through upper airway: No (No) Able to Attain Phonation: Yes Voice Quality: Low vocal intensity (strained) Able to Expectorate Secretions: Yes Level of Secretion Expectoration with PMSV: Tracheal Breath Support for Phonation: Adequate Intelligibility: Unable to assess (comment) Respirations During Trial: 22 SpO2 During Trial: 98 % Behavior: Alert;Anxious;Good eye contact;Responsive to questions;Smiling    Tracheostomy Tube       Vent Dependency  FiO2 (%): 28 %    Cuff Deflation Trial Cuff Deflation Trial - Comments: Cuff was deflated.   Walter Frazier T 11/17/2012, 2:38 PM

## 2012-11-17 NOTE — Evaluation (Signed)
Clinical/Bedside Swallow Evaluation Patient Details  Name: Walter Frazier MRN: [id number] Date of Birth: Dec 22, 1955  Today's Date: 11/17/2012 Time: 4132-4401 SLP Time Calculation (min): 23 min  Past Medical History: History reviewed. No pertinent past medical history. Past Surgical History: History reviewed. No pertinent past surgical history. HPI:  Walter Frazier is an 57 y.o. male with recent Cardiac arrest and MI. Resp failure s/p tracheostomy, making slow progress. Currently tolerating TF via NGT, but IR is requested to place perc G-tube for expected prolonged dysphagia and PCM during recovery.   Assessment / Plan / Recommendation Clinical Impression  Pt. currently is unable to utilize the PMSV with a #8 trach, therefore, this bedside swallow evaluation was completed without the PMSV in place.  It is anticipated that pt, could use the PMSV if the trach were downsized to a #6, and swallow function might be improved with PMSV in place.  Pt. also currently has a large NG tube, which may be negatively impacting the swallow as well.  Pt. exhibits positive cough after swallowing melted ice chip, and had multiple swallows with each small sip of nectar and small bite of puree.  Pt. is scheduled for a PEG in the am, however, once the trach is downsized and the PMSV tolerated, pt. may be able to take po's safely, pending MBS study.  Discussed with Dr. Vassie Loll.    Aspiration Risk  Moderate    Diet Recommendation NPO        Other  Recommendations Recommended Consults: MBS   Follow Up Recommendations  Inpatient Rehab    Frequency and Duration min 2x/week      Pertinent Vitals/Pain n/a    SLP Swallow Goals     Swallow Study Prior Functional Status       General HPI: Walter Frazier is an 57 y.o. male with recent Cardiac arrest and MI. Resp failure s/p tracheostomy, making slow progress. Currently tolerating TF via NGT, but IR is requested to place perc G-tube for expected  prolonged dysphagia and PCM during recovery. Type of Study: Bedside swallow evaluation Previous Swallow Assessment: n/a Diet Prior to this Study: NPO;Panda (PEG pending) Temperature Spikes Noted: No Respiratory Status: Trach Trach Size and Type: #8 History of Recent Intubation: Yes Length of Intubations (days): 12 days Date extubated: 11/10/12 (Reintubated 6/10 and trach placed) Behavior/Cognition: Alert;Cooperative;Pleasant mood;Distractible;Requires cueing;Decreased sustained attention Oral Cavity - Dentition: Adequate natural dentition Self-Feeding Abilities: Total assist Patient Positioning: Upright in bed Baseline Vocal Quality: Aphonic (trach) Volitional Cough: Strong Volitional Swallow: Unable to elicit    Oral/Motor/Sensory Function Overall Oral Motor/Sensory Function: Appears within functional limits for tasks assessed   Ice Chips Ice chips: Impaired Presentation: Spoon Pharyngeal Phase Impairments: Suspected delayed Swallow;Decreased hyoid-laryngeal movement;Cough - Immediate   Thin Liquid Thin Liquid: Not tested    Nectar Thick Nectar Thick Liquid: Impaired Presentation: Spoon Pharyngeal Phase Impairments: Suspected delayed Swallow;Multiple swallows   Honey Thick Honey Thick Liquid: Not tested   Puree Puree: Impaired Pharyngeal Phase Impairments: Suspected delayed Swallow;Multiple swallows   Solid   GO    Solid: Not tested       Walter Frazier 11/17/2012,2:58 PM

## 2012-11-17 NOTE — Progress Notes (Signed)
  Patient Name: Walter Frazier      SUBJECTIVE: Non responsive  History reviewed. No pertinent past medical history.  Scheduled Meds:  Scheduled Meds: . antiseptic oral rinse  15 mL Mouth Rinse QID  . aspirin  81 mg Per Tube Daily  . atorvastatin  80 mg Oral q1800  . [START ON 11/18/2012]  ceFAZolin (ANCEF) IV  2 g Intravenous Once  . chlorhexidine  15 mL Mouth Rinse BID  . [START ON 11/18/2012] clonazePAM  1 mg Per Tube Daily  . cloNIDine  0.1 mg Transdermal Weekly  . clopidogrel  75 mg Per Tube Q breakfast  . enalapril  5 mg Per Tube BID  . enoxaparin (LOVENOX) injection  40 mg Subcutaneous Daily  . fentaNYL  50 mcg Transdermal Q72H  . free water  200 mL Per Tube Q8H  . metoprolol succinate  25 mg Oral Daily  . phenytoin  200 mg Per Tube BID  . QUEtiapine  100 mg Oral QHS  . sodium chloride  10-40 mL Intracatheter Q12H  . Valproic Acid  500 mg Per Tube BID   Continuous Infusions: . sodium chloride 20 mL/hr at 11/15/12 0700  . feeding supplement (JEVITY 1.2 CAL) 1,000 mL (11/17/12 0329)    PHYSICAL EXAM Filed Vitals:   11/17/12 0743 11/17/12 0800 11/17/12 1046 11/17/12 1132  BP:  123/79 119/79 112/87  Pulse:  114 104 104  Temp:  97.7 F (36.5 C)  98.6 F (37 C)  TempSrc:  Axillary  Axillary  Resp:  18  13  Height:      Weight:      SpO2: 95% 97%  97%    Agitated moving in bed partiallyresponsive tovoice  TELEMETRY: Reviewed telemetry pt in nsr with rapid rateds    Intake/Output Summary (Last 24 hours) at 11/17/12 1146 Last data filed at 11/17/12 1133  Gross per 24 hour  Intake    785 ml  Output   1660 ml  Net   -875 ml    LABS: Basic Metabolic Panel:  Recent Labs Lab 11/11/12 0456 11/14/12 0445 11/17/12 0500  NA 141 140 141  K 4.2 3.8 3.9  CL 106 104 107  CO2 27 28 26   GLUCOSE 138* 128* 128*  BUN 23 19 21   CREATININE 0.70 0.59 0.60  CALCIUM 8.8 8.6 9.0   Cardiac Enzymes: No results found for this basename: CKTOTAL, CKMB, CKMBINDEX,  TROPONINI,  in the last 72 hours CBC:  Recent Labs Lab 11/11/12 0456 11/14/12 0445 11/17/12 0500  WBC 16.8* 15.5* 10.8*  NEUTROABS  --   --  6.4  HGB 12.4* 12.6* 12.6*  HCT 36.6* 35.8* 36.4*  MCV 92.9 89.9 90.1  PLT 455* 571* 629*     ASSESSMENT AND PLAN:  Principal Problem:   Cardiac arrest Active Problems:   STEMI (ST elevation myocardial infarction)   V-tach   Acute respiratory failure with hypoxia   Encephalopathy acute   Status epilepticus   UTI (lower urinary tract infection)   Tracheostomy status  Clonidine for agitation  Change meto tartrate to succinate   Signed, Sherryl Manges MD  11/17/2012

## 2012-11-17 NOTE — Progress Notes (Signed)
Pt. Repositioned in bed

## 2012-11-17 NOTE — Progress Notes (Signed)
PULMONARY  / CRITICAL CARE MEDICINE  Name: KALVYN DESA MRN: [id number] DOB: Dec 07, 1955    ADMISSION DATE:  10/30/2012 CONSULTATION DATE:  10/30/2012  REFERRING MD :  Diona Browner PRIMARY SERVICE: PCCM  CHIEF COMPLAINT:  Cardiac arrest  BRIEF PATIENT DESCRIPTION: 57 y/o male smoker with no known past medical history was admitted to [facility] on 5/29 after cardiac arrest from a STEMI.  He was comatose afterwards and was started on the hypothermia protocol and transferred to [facility].  SIGNIFICANT EVENTS / STUDIES:  5/29 - LHC > DES to prox LAD @ [facility]. IABP placed 5/29 - Transfer to [facility] for hypothermia 5/30 - Echo >> EF 25%, LV severely dilated 6/02 - RN noted ?myoclonus - sedation infusions resumed. Purposeful on WUA but not F/C.  6/02 - Neuro consult: EEG > non-convulsive status epilepticus. Resolution of these discharges after ativan. Loaded with fosphenytoin.  6/02 - CT head: NAD 6/03 - 24 hr EEG:  Diffuse slowing, suggestive of general encephalopathy 6/04 - Started on Keppra 6/06 - daily progress neuro wise, severe agiation 6/07 - weaned for 1 hour on PSV, agitation limiting factor 6/09 Passed SBT, extubated, tolerated approx 3 hrs. Very agitated, tachypneic despite dexmedetomidine. Reintubated. Trach planned 6/10 Trach tube placed 6/11 Foley removed. Urinary retention. Foley replaced. Urethral discharge noted. Cloudy urine. Ceftrx started for UTI 6/13 Transfer to SDU 6/14 Following commands!!! Fent patch, clonazepam doses decreased  LINES / TUBES: IABP 5/29 >> 6/01 ETT 5/29 >> 6/09, 6/09 >> 6/10 L IJ CVL 5/29 >> 6/10 L radial A-line 5/29 >> 6/4 Trach 6/10 >>  PICC 6/10 >>  G tube 6/17 (planned) >>   CULTURES: MRSA PCR 5/29 >> neg Urine 6/12 >> E coli  ANTIBIOTICS: Ceftrx 6/11 >> 6/14  SUBJ/Overnight: RASS 0. Afebrile Barium not given overnight  PHYSICAL EXAMINATION: Gen: NAD HEENT: Poor dentition, o/w WNL PULM: scattered rhonchi CV: RRR s M AB: BS+, soft, nontender,  no hsm Ext: warm/dry, no edema Neuro: moves all ext with full strength, + F/C  LABS: BMET    Component Value Date/Time   NA 141 11/17/2012 0500   K 3.9 11/17/2012 0500   CL 107 11/17/2012 0500   CO2 26 11/17/2012 0500   GLUCOSE 128* 11/17/2012 0500   BUN 21 11/17/2012 0500   CREATININE 0.60 11/17/2012 0500   CALCIUM 9.0 11/17/2012 0500   GFRNONAA >90 11/17/2012 0500   GFRAA >90 11/17/2012 0500     CBC    Component Value Date/Time   WBC 10.8* 11/17/2012 0500   RBC 4.04* 11/17/2012 0500   HGB 12.6* 11/17/2012 0500   HCT 36.4* 11/17/2012 0500   PLT 629* 11/17/2012 0500   MCV 90.1 11/17/2012 0500   MCH 31.2 11/17/2012 0500   MCHC 34.6 11/17/2012 0500   RDW 12.7 11/17/2012 0500   LYMPHSABS 2.8 11/17/2012 0500   MONOABS 1.0 11/17/2012 0500   EOSABS 0.5 11/17/2012 0500   BASOSABS 0.1 11/17/2012 0500     ABG    Component Value Date/Time   PHART 7.437 11/11/2012 0430   PCO2ART 41.1 11/11/2012 0430   PO2ART 90.0 11/11/2012 0430   HCO3 27.7* 11/11/2012 0430   TCO2 29 11/11/2012 0430   ACIDBASEDEF 0.6 11/03/2012 0645   O2SAT 97.0 11/11/2012 0430     Recent Labs Lab 11/15/12 1616 11/16/12 0011 11/16/12 0736 11/16/12 1544 11/17/12 0820  GLUCAP 126* 111* 105* 114* 124*    CXR: no new film  ASSESSMENT / PLAN:  CARDIOVASCULAR A:  Cardiac arrest 5/29 -  VT/VF Anterior wall STEMI 5/29, prox LAD occlusion - s/p DES PCI @ [facility] Cardiogenic shock, resolved.   HTN  P:  -continue current Rx -Cards following  PULMONARY A:  Acute respiratory failure post cardiac arrest LLL Airspace Disease - atelectasis, resolved Failed extubation 6/09 Tracheostomy status P:   -Cont ATC as tolerated   NEUROLOGIC A:   Post anoxic encephalopathy - improving Status epilepticus, resolved Severe agitation - improved Apparent aphasia, resolved   P:   -Cont anticonvulsants - phenytoin and VPA -Cont fentanyl patch and low dose PRN fentanyl -Cont scheduled clonazepam - started 6/11, increased 6/12,  reduced 6/14 & 6/16 to 1 mg daily -Cont clonidine patch as adjunct  -Cont quetiapine 6/12 - reduced 6/13  RENAL A: Hyponatremia, resolved Hypokalemia, resolved P:   -Correct electrolytes as indicated  GASTROINTESTINAL A:  Tolerating TF P:   -SUP no longer indicated as he is liberated from vent -Cont  TFs -Cont free water -G tube per IR planned 6/17   HEMATOLOGIC A:   DVT prevention P:  - Cont LMWH - hold today for PEG in am   INFECTIOUS E coli UTI  P:   -micro and abx as above   After G tube placed, should be ready for SNF  Cyril Mourning MD. FCCP. [facility] Pager [phone] If no response call [phone]

## 2012-11-17 NOTE — Progress Notes (Signed)
Pt. Frequently found with feet dangling over the side of the bed and setting off bed alarm. Sitter requested  For safety.

## 2012-11-17 NOTE — Progress Notes (Signed)
NUTRITION FOLLOW UP  Intervention:   1. Advance Jevity 1.2 by 10 mL q 4 hrs to 65 ml/hr and discontinue Prostat to provide 1872 kcal, 86 gm protein, and 1279 ml free water once PEG placed.   Nutrition Dx:   Inadequate oral intake related to inability to eat as evidenced by NPO diet. Ongoing   Goal:   Meet >/=90% estimated nutrition needs. Met.  Monitor:   Vent status, weight trends, labs   Assessment:   Pt admitted with cardiac arrest. Extubated (6/9), however unable to sustain, now with trach collar.   Pt planning for PEG placement today, however postponed to tomorrow for contrast administration.    Pt has been receiving Jevity 1.2 @ 45 mL/hr with Prostat QID via NGT. Needs re-estimated due to pt no longer requiring vent.  No reported concerns from RN.   Height: Ht Readings from Last 1 Encounters:  10/30/12 5\' 9"  (1.753 m)    Weight Status:   Wt Readings from Last 1 Encounters:  11/17/12 156 lb 8.4 oz (71 kg)    Re-estimated needs:  Kcal: 8756-4332 Protein: 85-99 gm  Fluid: >/= 1.8 L   Skin: intact   Diet Order:     Intake/Output Summary (Last 24 hours) at 11/17/12 1039 Last data filed at 11/17/12 0800  Gross per 24 hour  Intake    785 ml  Output   1920 ml  Net  -1135 ml    Last BM: 6/14   Labs:   Recent Labs Lab 11/11/12 0456 11/14/12 0445 11/17/12 0500  NA 141 140 141  K 4.2 3.8 3.9  CL 106 104 107  CO2 27 28 26   BUN 23 19 21   CREATININE 0.70 0.59 0.60  CALCIUM 8.8 8.6 9.0  GLUCOSE 138* 128* 128*    CBG (last 3)   Recent Labs  11/16/12 0736 11/16/12 1544 11/17/12 0820  GLUCAP 105* 114* 124*    Scheduled Meds: . antiseptic oral rinse  15 mL Mouth Rinse QID  . aspirin  81 mg Per Tube Daily  . atorvastatin  80 mg Oral q1800  . [START ON 11/18/2012]  ceFAZolin (ANCEF) IV  2 g Intravenous Once  . chlorhexidine  15 mL Mouth Rinse BID  . clonazePAM  1 mg Per Tube BID  . cloNIDine  0.1 mg Transdermal Weekly  . clopidogrel  75 mg Per  Tube Q breakfast  . enalapril  5 mg Per Tube BID  . enoxaparin (LOVENOX) injection  40 mg Subcutaneous Daily  . feeding supplement  30 mL Per Tube QID  . fentaNYL  50 mcg Transdermal Q72H  . free water  200 mL Per Tube Q8H  . metoprolol tartrate  25 mg Per Tube BID  . phenytoin  200 mg Per Tube BID  . QUEtiapine  100 mg Oral QHS  . sodium chloride  10-40 mL Intracatheter Q12H  . Valproic Acid  500 mg Per Tube BID    Continuous Infusions: . sodium chloride 20 mL/hr at 11/15/12 0700  . feeding supplement (JEVITY 1.2 CAL) 1,000 mL (11/17/12 0329)    Loyce Dys, MS RD LDN Clinical Inpatient Dietitian Pager: [phone] Weekend/After hours pager: [phone]

## 2012-11-17 NOTE — Progress Notes (Signed)
Patient ID: Walter Frazier, male   DOB: 1956-06-01, 57 y.o.   MRN: [id number]   Pt was scheduled for percutaneous gastric tube to be performed in IR today  Unfortunately, pt was not given 1 cup of barium per NG at 8pm last night as ordered. Will need to opacify bowel to safely perform procedure.  Orders re written for 6/17 am: Barium per ng at 8pm tonight Stop tube feeds MN tonight- except meds abd film in am Hold Lovenox 6/17 Ancef 2 gr on call to xray 6/17  Contacted RN- aware.

## 2012-11-18 ENCOUNTER — Inpatient Hospital Stay (HOSPITAL_COMMUNITY): Payer: BC Managed Care – PPO

## 2012-11-18 LAB — GLUCOSE, CAPILLARY
Glucose-Capillary: 105 mg/dL — ABNORMAL HIGH (ref 70–99)
Glucose-Capillary: 114 mg/dL — ABNORMAL HIGH (ref 70–99)

## 2012-11-18 MED ORDER — ENOXAPARIN SODIUM 40 MG/0.4ML ~~LOC~~ SOLN
40.0000 mg | Freq: Every day | SUBCUTANEOUS | Status: DC
  Administered 2012-11-19 – 2012-11-25 (×7): 40 mg via SUBCUTANEOUS
  Filled 2012-11-18 (×7): qty 0.4

## 2012-11-18 NOTE — Progress Notes (Signed)
Patient ID: [name], male   DOB: 12/26/1955, 57 y.o.   MRN: [id number]  Patient Name: Walter Frazier      SUBJECTIVE: No direct response Sitter indicates some agitation  History reviewed. No pertinent past medical history.  Scheduled Meds:  Scheduled Meds: . antiseptic oral rinse  15 mL Mouth Rinse QID  . aspirin  81 mg Per Tube Daily  . atorvastatin  80 mg Oral q1800  .  ceFAZolin (ANCEF) IV  2 g Intravenous Once  . chlorhexidine  15 mL Mouth Rinse BID  . clonazePAM  1 mg Per Tube Daily  . cloNIDine  0.1 mg Transdermal Weekly  . clopidogrel  75 mg Per Tube Q breakfast  . enalapril  5 mg Per Tube BID  . enoxaparin (LOVENOX) injection  40 mg Subcutaneous Daily  . fentaNYL  50 mcg Transdermal Q72H  . free water  200 mL Per Tube Q8H  . metoprolol tartrate  25 mg Per Tube BID  . phenytoin  200 mg Per Tube BID  . QUEtiapine  100 mg Oral QHS  . sodium chloride  10-40 mL Intracatheter Q12H  . Valproic Acid  500 mg Per Tube BID   Continuous Infusions: . sodium chloride 20 mL/hr at 11/15/12 0700  . feeding supplement (JEVITY 1.2 CAL) 1,000 mL (11/17/12 0329)    PHYSICAL EXAM Filed Vitals:   11/18/12 0400 11/18/12 0409 11/18/12 0500 11/18/12 0600  BP: 104/76 104/76 118/77 108/72  Pulse: 93 94 91 90  Temp:  98.7 F (37.1 C)    TempSrc:  Axillary    Resp: 12 20 12 12   Height:      Weight:  156 lb 8.4 oz (71 kg)    SpO2: 94% 94% 97% 92%    Agitated moving in bed partiallyresponsive tovoice  TELEMETRY: Reviewed telemetry pt in nsr with rapid rates 11/18/2012     Intake/Output Summary (Last 24 hours) at 11/18/12 0740 Last data filed at 11/18/12 0510  Gross per 24 hour  Intake    495 ml  Output   1340 ml  Net   -845 ml    LABS: Basic Metabolic Panel:  Recent Labs Lab 11/14/12 0445 11/17/12 0500  NA 140 141  K 3.8 3.9  CL 104 107  CO2 28 26  GLUCOSE 128* 128*  BUN 19 21  CREATININE 0.59 0.60  CALCIUM 8.6 9.0   Cardiac Enzymes: No results  found for this basename: CKTOTAL, CKMB, CKMBINDEX, TROPONINI,  in the last 72 hours CBC:  Recent Labs Lab 11/14/12 0445 11/17/12 0500  WBC 15.5* 10.8*  NEUTROABS  --  6.4  HGB 12.6* 12.6*  HCT 35.8* 36.4*  MCV 89.9 90.1  PLT 571* 629*     ASSESSMENT AND PLAN:  Principal Problem:   Cardiac arrest Active Problems:   STEMI (ST elevation myocardial infarction)   V-tach   Acute respiratory failure with hypoxia   Encephalopathy acute   Status epilepticus   UTI (lower urinary tract infection)   Tracheostomy status  Clonidine for agitation  SK changed beta blocker yesterday.   Trach/FT and other issues per primary service  Nothing else to add   Signed, Charlton Haws MD  11/18/2012

## 2012-11-18 NOTE — Progress Notes (Signed)
Pt placed on 21% ATC and is tolerating well at this time. RT will monitor.

## 2012-11-18 NOTE — Progress Notes (Signed)
PULMONARY  / CRITICAL CARE MEDICINE  Name: Walter Frazier MRN: [id number] DOB: Dec 10, 1955    ADMISSION DATE:  10/30/2012 CONSULTATION DATE:  10/30/2012  REFERRING MD :  Diona Browner PRIMARY SERVICE: PCCM  CHIEF COMPLAINT:  Cardiac arrest  BRIEF PATIENT DESCRIPTION: 57 y/o male smoker with no known past medical history was admitted to [facility] on 5/29 after cardiac arrest from a STEMI.  He was comatose afterwards and was started on the hypothermia protocol and transferred to [facility].  SIGNIFICANT EVENTS / STUDIES:  5/29 - LHC > DES to prox LAD @ [facility]. IABP placed 5/29 - Transfer to [facility] for hypothermia 5/30 - Echo >> EF 25%, LV severely dilated 6/02 - RN noted ?myoclonus - sedation infusions resumed. Purposeful on WUA but not F/C.  6/02 - Neuro consult: EEG > non-convulsive status epilepticus. Resolution of these discharges after ativan. Loaded with fosphenytoin.  6/02 - CT head: NAD 6/03 - 24 hr EEG:  Diffuse slowing, suggestive of general encephalopathy 6/04 - Started on Keppra 6/06 - daily progress neuro wise, severe agiation 6/07 - weaned for 1 hour on PSV, agitation limiting factor 6/09 Passed SBT, extubated, tolerated approx 3 hrs. Very agitated, tachypneic despite dexmedetomidine. Reintubated. Trach planned 6/10 Trach tube placed 6/11 Foley removed. Urinary retention. Foley replaced. Urethral discharge noted. Cloudy urine. Ceftrx started for UTI 6/13 Transfer to SDU 6/14 Following commands!!! Fent patch, clonazepam doses decreased G tube 6/17>> deferred  LINES / TUBES: IABP 5/29 >> 6/01 ETT 5/29 >> 6/09, 6/09 >> 6/10 L IJ CVL 5/29 >> 6/10 L radial A-line 5/29 >> 6/4 Trach 6/10 >>  PICC 6/10 >>    CULTURES: MRSA PCR 5/29 >> neg Urine 6/12 >> E coli  ANTIBIOTICS: Ceftrx 6/11 >> 6/14  SUBJ/Overnight:  Afebrile Agitation overnight  PHYSICAL EXAMINATION: Gen: NAD HEENT: Poor dentition, o/w WNL PULM: scattered rhonchi CV: RRR s M AB: BS+, soft, nontender, no hsm Ext:  warm/dry, no edema Neuro: moves all ext with full strength, + F/C  LABS: BMET    Component Value Date/Time   NA 141 11/17/2012 0500   K 3.9 11/17/2012 0500   CL 107 11/17/2012 0500   CO2 26 11/17/2012 0500   GLUCOSE 128* 11/17/2012 0500   BUN 21 11/17/2012 0500   CREATININE 0.60 11/17/2012 0500   CALCIUM 9.0 11/17/2012 0500   GFRNONAA >90 11/17/2012 0500   GFRAA >90 11/17/2012 0500     CBC    Component Value Date/Time   WBC 10.8* 11/17/2012 0500   RBC 4.04* 11/17/2012 0500   HGB 12.6* 11/17/2012 0500   HCT 36.4* 11/17/2012 0500   PLT 629* 11/17/2012 0500   MCV 90.1 11/17/2012 0500   MCH 31.2 11/17/2012 0500   MCHC 34.6 11/17/2012 0500   RDW 12.7 11/17/2012 0500   LYMPHSABS 2.8 11/17/2012 0500   MONOABS 1.0 11/17/2012 0500   EOSABS 0.5 11/17/2012 0500   BASOSABS 0.1 11/17/2012 0500     ABG    Component Value Date/Time   PHART 7.437 11/11/2012 0430   PCO2ART 41.1 11/11/2012 0430   PO2ART 90.0 11/11/2012 0430   HCO3 27.7* 11/11/2012 0430   TCO2 29 11/11/2012 0430   ACIDBASEDEF 0.6 11/03/2012 0645   O2SAT 97.0 11/11/2012 0430     Recent Labs Lab 11/16/12 0736 11/16/12 1544 11/17/12 0820 11/17/12 1629 11/18/12 0026  GLUCAP 105* 114* 124* 148* 105*    CXR: no new film  ASSESSMENT / PLAN:  CARDIOVASCULAR A:  Cardiac arrest 5/29 - VT/VF Anterior wall STEMI  5/29, prox LAD occlusion - s/p DES PCI @ [facility] Cardiogenic shock, resolved.   HTN  P:  -continue enalapril, ASA, metoprolol -dc clonidine patch  -Cards following  PULMONARY A:  Acute respiratory failure post cardiac arrest LLL Airspace Disease - atelectasis, resolved Failed extubation 6/09 Tracheostomy status P:   -Cont ATC as tolerated - downsize soon   NEUROLOGIC A:   Post anoxic encephalopathy - improving Status epilepticus, resolved Severe agitation - improved Apparent aphasia, resolved   P:   -Cont anticonvulsants - phenytoin and VPA -Cont fentanyl patch and low dose PRN fentanyl -Cont scheduled  clonazepam - started 6/11 reduced by 6/16 to 1 mg daily -Cont quetiapine 6/12 - reduced 6/13  RENAL A: Hyponatremia, resolved Hypokalemia, resolved P:   -Correct electrolytes as indicated  GASTROINTESTINAL A:  Tolerating TF P:   -SUP no longer indicated as he is liberated from vent -Cont  TFs -Cont free water -cancel G tube per IR  - since swallow improved   HEMATOLOGIC A:   DVT prevention P:  - resume LMWH    INFECTIOUS E coli UTI  P:   -micro and abx as above   Will have to downsize & address swallow before transfer to [facility] for SNF  Cyril Mourning MD. FCCP. [facility] Pager [phone] If no response call [phone]

## 2012-11-19 ENCOUNTER — Encounter (HOSPITAL_COMMUNITY): Payer: Self-pay | Admitting: Physical Medicine and Rehabilitation

## 2012-11-19 ENCOUNTER — Inpatient Hospital Stay (HOSPITAL_COMMUNITY): Payer: BC Managed Care – PPO

## 2012-11-19 LAB — GLUCOSE, CAPILLARY

## 2012-11-19 MED ORDER — RESOURCE THICKENUP CLEAR PO POWD
ORAL | Status: DC | PRN
  Filled 2012-11-19: qty 125

## 2012-11-19 NOTE — Progress Notes (Addendum)
Trach changed to a #6 shiley cuffless. Trach changed using a catheter as a guide. Placement confirmed by equal BBS. Pt remains on 21% ATC. Pt tolerated change well, vitals remained WNL. No complications noted. RT will monitor.

## 2012-11-19 NOTE — Progress Notes (Addendum)
NUTRITION FOLLOW UP  Intervention:   1. Enteral nutrition; PEG has been deferred as pt was approved for PO diet.  Recommend low threshold for resuming EN in the event PO diet is inadequate.  RD to follow ongoing interventions.  Will assess for need of supplements as pt demonstrates tolerance of diet.   Nutrition Dx:   Inadequate oral intake related to inability to eat as evidenced by NPO diet. Ongoing   Goal:   Meet >/=90% estimated nutrition needs. Not met, NGT removed, to resume PO diet per SLP  Monitor:   Vent status, weight trends, labs, PO intake, adequacy   Assessment:   Pt admitted with cardiac arrest.  Remains on trach collar.  Pt now following commands.  NGT has been removed. PEG has been placed on hold.  Discussed with SLP who evaluated swallowing today and determined pt appropriate for Dysphagia 3, nectar-thick liquid.    Pt with ongoing cognitive impairments although improvement noted.  Possible CIR candidate.     RD to follow for nutritional adequacy.  Note wt change since admission of 12 lbs. Will follow wt trends as pt is at risk for malnutrition  Height: Ht Readings from Last 1 Encounters:  10/30/12 5\' 9"  (1.753 m)    Weight Status:   Wt Readings from Last 1 Encounters:  11/18/12 156 lb 8.4 oz (71 kg)    Re-estimated needs:  Kcal: 4098-1191 Protein: 85-99 gm  Fluid: >/= 1.8 L   Skin: intact   Diet Order:     Intake/Output Summary (Last 24 hours) at 11/19/12 1258 Last data filed at 11/19/12 0759  Gross per 24 hour  Intake   1605 ml  Output    875 ml  Net    730 ml    Last BM: 6/16   Labs:   Recent Labs Lab 11/14/12 0445 11/17/12 0500  NA 140 141  K 3.8 3.9  CL 104 107  CO2 28 26  BUN 19 21  CREATININE 0.59 0.60  CALCIUM 8.6 9.0  GLUCOSE 128* 128*    CBG (last 3)   Recent Labs  11/18/12 1601 11/19/12 0029 11/19/12 0755  GLUCAP 114* 194* 130*    Scheduled Meds: . antiseptic oral rinse  15 mL Mouth Rinse QID  . aspirin   81 mg Per Tube Daily  . atorvastatin  80 mg Oral q1800  . chlorhexidine  15 mL Mouth Rinse BID  . clonazePAM  1 mg Per Tube Daily  . clopidogrel  75 mg Per Tube Q breakfast  . enalapril  5 mg Per Tube BID  . enoxaparin (LOVENOX) injection  40 mg Subcutaneous Daily  . fentaNYL  50 mcg Transdermal Q72H  . free water  200 mL Per Tube Q8H  . metoprolol tartrate  25 mg Per Tube BID  . phenytoin  200 mg Per Tube BID  . QUEtiapine  100 mg Oral QHS  . sodium chloride  10-40 mL Intracatheter Q12H  . Valproic Acid  500 mg Per Tube BID    Continuous Infusions: . sodium chloride 20 mL/hr at 11/15/12 0700  . feeding supplement (JEVITY 1.2 CAL) 1,000 mL (11/19/12 0541)    Loyce Dys, MS RD LDN Clinical Inpatient Dietitian Pager: [phone] Weekend/After hours pager: [phone]

## 2012-11-19 NOTE — Progress Notes (Signed)
PULMONARY  / CRITICAL CARE MEDICINE  Name: Walter Frazier MRN: [id number] DOB: [birth year]    ADMISSION DATE:  10/30/2012 CONSULTATION DATE:  10/30/2012  REFERRING MD :  Diona Browner PRIMARY SERVICE: PCCM  CHIEF COMPLAINT:  Cardiac arrest  BRIEF PATIENT DESCRIPTION: 57 y/o male smoker with no known past medical history was admitted to [facility] on 5/29 after cardiac arrest from a STEMI.  He was comatose afterwards and was started on the hypothermia protocol and transferred to [facility].  SIGNIFICANT EVENTS / STUDIES:  5/29 - LHC > DES to prox LAD @ [facility]. IABP placed 5/29 - Transfer to [facility] for hypothermia 5/30 - Echo >> EF 25%, LV severely dilated 6/02 - RN noted ?myoclonus - sedation infusions resumed. Purposeful on WUA but not F/C.  6/02 - Neuro consult: EEG > non-convulsive status epilepticus. Resolution of these discharges after ativan. Loaded with fosphenytoin.  6/02 - CT head: NAD 6/03 - 24 hr EEG:  Diffuse slowing, suggestive of general encephalopathy 6/04 - Started on Keppra 6/06 - daily progress neuro wise, severe agiation 6/07 - weaned for 1 hour on PSV, agitation limiting factor 6/09 Passed SBT, extubated, tolerated approx 3 hrs. Very agitated, tachypneic despite dexmedetomidine. Reintubated. Trach planned 6/10 Trach tube placed 6/11 Foley removed. Urinary retention. Foley replaced. Urethral discharge noted. Cloudy urine. Ceftrx started for UTI 6/13 Transfer to SDU 6/14 Following commands!!! Fent patch, clonazepam doses decreased G tube 6/17>> deferred  LINES / TUBES: IABP 5/29 >> 6/01 ETT 5/29 >> 6/09, 6/09 >> 6/10 L IJ CVL 5/29 >> 6/10 L radial A-line 5/29 >> 6/4 Trach 6/10 >> 6/18 downsized to #6 PICC 6/10 >>    CULTURES: MRSA PCR 5/29 >> neg Urine 6/12 >> E coli  ANTIBIOTICS: Ceftrx 6/11 >> 6/14  SUBJ/Overnight:  Afebrile Decreased Agitation overnight Pulled out NGT  PHYSICAL EXAMINATION: Gen: NAD HEENT: Poor dentition, o/w WNL PULM:no rhonchi CV: RRR s M AB:  BS+, soft, nontender, no hsm Ext: warm/dry, no edema Neuro: moves all ext with full strength, + F/C  LABS: BMET    Component Value Date/Time   NA 141 11/17/2012 0500   K 3.9 11/17/2012 0500   CL 107 11/17/2012 0500   CO2 26 11/17/2012 0500   GLUCOSE 128* 11/17/2012 0500   BUN 21 11/17/2012 0500   CREATININE 0.60 11/17/2012 0500   CALCIUM 9.0 11/17/2012 0500   GFRNONAA >90 11/17/2012 0500   GFRAA >90 11/17/2012 0500     CBC    Component Value Date/Time   WBC 10.8* 11/17/2012 0500   RBC 4.04* 11/17/2012 0500   HGB 12.6* 11/17/2012 0500   HCT 36.4* 11/17/2012 0500   PLT 629* 11/17/2012 0500   MCV 90.1 11/17/2012 0500   MCH 31.2 11/17/2012 0500   MCHC 34.6 11/17/2012 0500   RDW 12.7 11/17/2012 0500   LYMPHSABS 2.8 11/17/2012 0500   MONOABS 1.0 11/17/2012 0500   EOSABS 0.5 11/17/2012 0500   BASOSABS 0.1 11/17/2012 0500     ABG    Component Value Date/Time   PHART 7.437 11/11/2012 0430   PCO2ART 41.1 11/11/2012 0430   PO2ART 90.0 11/11/2012 0430   HCO3 27.7* 11/11/2012 0430   TCO2 29 11/11/2012 0430   ACIDBASEDEF 0.6 11/03/2012 0645   O2SAT 97.0 11/11/2012 0430     Recent Labs Lab 11/18/12 0026 11/18/12 0800 11/18/12 1601 11/19/12 0029 11/19/12 0755  GLUCAP 105* 113* 114* 194* 130*    CXR: no new film  ASSESSMENT / PLAN:  CARDIOVASCULAR A:  Cardiac arrest  5/29 - VT/VF Anterior wall STEMI 5/29, prox LAD occlusion - s/p DES PCI @ [facility] Cardiogenic shock, resolved.   HTN  P:  -continue enalapril, ASA, metoprolol -dc clonidine patch  -Cards following  PULMONARY A:  Acute respiratory failure post cardiac arrest LLL Airspace Disease - atelectasis, resolved Failed extubation 6/09 Tracheostomy status P:   -Cont ATC as tolerated - downsized to 6 cuffless -rpt swallow eval   NEUROLOGIC A:   Post anoxic encephalopathy - improving Status epilepticus, resolved Severe agitation - improved Apparent aphasia, resolved   P:   -Cont anticonvulsants - phenytoin and VPA -Cont  fentanyl patch and low dose PRN fentanyl -Cont scheduled clonazepam - started 6/11 reduced by 6/16 to 1 mg daily -Cont quetiapine 6/12 - reduced 6/13  RENAL A: Hyponatremia, resolved Hypokalemia, resolved P:   -Correct electrolytes as indicated  GASTROINTESTINAL A:  Tolerating TF P:   -SUP no longer indicated as he is liberated from vent -rpt swallow - if fails, reinsert NG & resume TFs -Cont free water -cancel G tube per IR  - since swallow improved   HEMATOLOGIC A:   DVT prevention P:  - resume LMWH    INFECTIOUS E coli UTI  P:   -micro and abx as above   Downsized trach & address swallow before transfer to SNF, await CIr input  Cyril Mourning MD. Tonny Bollman. [facility] Pager [phone] If no response call [phone]

## 2012-11-19 NOTE — Plan of Care (Signed)
Problem: Phase III Progression Outcomes Goal: OOB with assistance Outcome: Progressing PT & Inpatient Rehab Consults in progress

## 2012-11-19 NOTE — Progress Notes (Addendum)
Rehab Admissions Coordinator Note:  Patient was screened by Brock Ra for appropriateness for an Inpatient Acute Rehab Consult.  At this time, we are recommending Inpatient Rehab consult.  Note order for consult already written-thanks.  Brock Ra 11/19/2012, 3:12 PM  I can be reached at [phone].

## 2012-11-19 NOTE — Clinical Social Work Placement (Signed)
     Clinical Social Work Department CLINICAL SOCIAL WORK PLACEMENT NOTE 11/19/2012  Patient:  Walter Frazier, Walter Frazier  Account Number:  [id number] Admit date:  10/30/2012  Clinical Social Worker:  Hulan Fray  Date/time:  11/19/2012 03:36 PM  Clinical Social Work is seeking post-discharge placement for this patient at the following level of care:   SKILLED NURSING   (*CSW will update this form in Epic as items are completed)   11/19/2012  Patient/family provided with [facility] Department of Clinical Social Works list of facilities offering this level of care within the geographic area requested by the patient (or if unable, by the patients family).  11/19/2012  Patient/family informed of their freedom to choose among providers that offer the needed level of care, that participate in Medicare, Medicaid or managed care program needed by the patient, have an available bed and are willing to accept the patient.  11/19/2012  Patient/family informed of [facility] ownership interest in [facility], as well as of the fact that they are under no obligation to receive care at this facility.  PASARR submitted to EDS on 11/19/2012 PASARR number received from EDS on   FL2 transmitted to all facilities in geographic area requested by pt/family on   FL2 transmitted to all facilities within larger geographic area on   Patient informed that his/her managed care company has contracts with or will negotiate with  certain facilities, including the following:     Patient/family informed of bed offers received:   Patient chooses bed at  Physician recommends and patient chooses bed at    Patient to be transferred to  on   Patient to be transferred to facility by   The following physician request were entered in Epic:   Additional Comments:

## 2012-11-19 NOTE — Progress Notes (Signed)
Pulled out feeding tube. Dr. Vassie Loll advised. For modified barium.

## 2012-11-19 NOTE — Clinical Social Work Psychosocial (Signed)
     Clinical Social Work Department BRIEF PSYCHOSOCIAL ASSESSMENT 11/19/2012  Patient:  Walter Frazier, Walter Frazier     Account Number:  [id number]     Admit date:  10/30/2012  Clinical Social Worker:  Hulan Fray  Date/Time:  11/19/2012 02:20 PM  Referred by:  Care Management  Date Referred:  11/19/2012 Referred for  SNF Placement   Other Referral:   Interview type:  Other - See comment Other interview type:   Left message for wife, Walter Frazier [phone])    PSYCHOSOCIAL DATA Living Status:  WIFE Admitted from facility:   Level of care:   Primary support name:  Walter Frazier Primary support relationship to patient:  SPOUSE Degree of support available:   supportive    CURRENT CONCERNS Current Concerns  Post-Acute Placement   Other Concerns:    SOCIAL WORK ASSESSMENT / PLAN Clinical Social Worker received referral for possible need for SNF placement, pending CIR evaluation and insurance approval. CSW went by patient's room, but he was currently off floor in a procedure. CSW left SNF list in patient's room. CSW staffed case with CM and called wife and left message to return call.    CSW will await return call and the initiate SNF search once CSW speaks with spouse.   Assessment/plan status:  Psychosocial Support/Ongoing Assessment of Needs Other assessment/ plan:   Information/referral to community resources:   SNF list left in patient's room    PATIENTS/FAMILYS RESPONSE TO PLAN OF CARE: CSW is awaiting return call from patient's wife to discuss intiation of SNF search in county of preference for back up to CIR evaluation.          (CSW Covering [id number])

## 2012-11-19 NOTE — Consult Note (Signed)
Physical Medicine and Rehabilitation Consult  Reason for Consult: Anoxic encephalopathy Referring Physician:  Dr. Vassie Loll   HPI: DINERO CHAVIRA is a 57 y.o. male admitted with prior history of 1 ppd tobacco who had out of hospital cardiac arrest and was taken to [facility]. He was taken the cardiac catheterization lab given ST elevation on ECG in setting of cardiac arrest with had proximal LAD occlusion treated with atherectomy and DES to proximal LAD.  Echo with EF 25% with severely dilated LV. Post procedure comatose with acute respiratory failure requiring intubation and treated with hypothermia protocol. He was noted to have posturing post warming and neurology consulted due to concerns of hypoxic injury. CT head without acute changes. EEG consistent with nonconvulsive status epilepticus and patient started dilantin and keppra. Agitation treated with propofol.  He failed extubation  requiring trach placement 11/11/12 and patient started on vent wean. VF treated with amiodarone with question of life vest past discharge. He was extubated to [facility] 06/17 and therapy evaluations done today. Patient has difficulty following commands with ataxic movements and posterior lean with difficulty advancing feet. MBS done 06/18 and patient started on D3, nectar liquids.  PT,OT, ST, MD recommending CIR.   Review of Systems  Unable to perform ROS: language  Respiratory: Negative for shortness of breath.   Cardiovascular: Negative for chest pain.   History reviewed. No pertinent past medical history.  Past Surgical History  Procedure Laterality Date  . Back surgery      ~20 years ago by Dr. Jeral Fruit    Family History  Problem Relation Age of Onset  . Heart disease Father     Social History:  Married. Used to work for a [employer] but has been laid off. Does woodwork for friends. Active. Wife at home and can provide supervision past discharge. Per reports that he has been smoking  Cigarettes.  He has been smoking about 1.00 pack per day. He does not have any smokeless tobacco history on file. He reports that he drinks about 6.0 ounces of alcohol per week. He reports that he does not use illicit drugs.  Allergies: No Known Allergies  No prescriptions prior to admission    Home: Home Living Additional Comments: Pt is a poor historian  Functional History: Prior Function Comments: Patient not able to communicate PLOF Functional Status:  Mobility: Bed Mobility Bed Mobility: Rolling Right;Right Sidelying to Sit Rolling Right: 1: +2 Total assist Rolling Right: Patient Percentage: 70% Right Sidelying to Sit: 1: +2 Total assist Right Sidelying to Sit: Patient Percentage: 40% Transfers Transfers: Sit to Stand;Stand to Sit;Stand Pivot Transfers Sit to Stand: 2: Max assist;From bed Stand to Sit: 1: +2 Total assist Stand to Sit: Patient Percentage: 40% Stand Pivot Transfers: 1: +2 Total assist Stand Pivot Transfers: Patient Percentage: 40%      ADL:    Cognition: Cognition Overall Cognitive Status: Impaired/Different from baseline Arousal/Alertness: Lethargic Orientation Level: Other (comment) (UTA, trach patient) Attention: Focused Focused Attention: Impaired Focused Attention Impairment: Verbal basic Memory:  (unable to assess due to lethargy) Awareness: Impaired Awareness Impairment: Intellectual impairment;Emergent impairment;Anticipatory impairment Problem Solving: Impaired Problem Solving Impairment: Functional basic Behaviors: Restless Safety/Judgment: Impaired Cognition Arousal/Alertness: Lethargic Behavior During Therapy: Impulsive Overall Cognitive Status: Impaired/Different from baseline Area of Impairment: Orientation;Attention;Following commands;Memory;Safety/judgement;Awareness;Problem solving Orientation Level: Disoriented to;Place;Situation;Time Current Attention Level: Focused Memory: Decreased short-term memory Following Commands:  Follows one step commands inconsistently Safety/Judgement: Decreased awareness of deficits;Decreased awareness of safety Problem Solving: Slow processing;Decreased initiation;Difficulty sequencing;Requires  tactile cues General Comments: Pt does not respond to verbal cues, requires tactile cues  Blood pressure 118/80, pulse 95, temperature 98.3 F (36.8 C), temperature source Oral, resp. rate 21, height 5\' 9"  (1.753 m), weight 71 kg (156 lb 8.4 oz), SpO2 98.00%.  Physical Exam  Nursing note and vitals reviewed. Constitutional: He appears well-developed and well-nourished.  HENT:  Head: Normocephalic and atraumatic.  Eyes: Pupils are equal, round, and reactive to light.  Neck:  Trach in place with copious mucoid secretions.   Cardiovascular: Normal rate and regular rhythm.   Pulmonary/Chest: Effort normal and breath sounds normal.  #6 trach.   Abdominal: Soft. Bowel sounds are normal. He exhibits no distension.  Neurological: He is alert.  Alert and pleasant. Wearing bilateral mittens.  some perseverative behaviors noted. Oriented to self only. Able to state age but not DOB. Able to follow one step commands with visual cues..inaccurate with yes/no answers.  Moves all four on command but inconsistent with effort. Distracted easily.      Results for orders placed during the hospital encounter of 10/30/12 (from the past 24 hour(s))  GLUCOSE, CAPILLARY     Status: Abnormal   Collection Time    11/19/12 12:29 AM      Result Value Range   Glucose-Capillary 194 (*) 70 - 99 mg/dL   Comment 1 Notify RN    GLUCOSE, CAPILLARY     Status: Abnormal   Collection Time    11/19/12  7:55 AM      Result Value Range   Glucose-Capillary 130 (*) 70 - 99 mg/dL   Comment 1 Notify RN     Dg Abd Portable 1v  11/18/2012   *RADIOLOGY REPORT*  Clinical Data: Evaluate barium prior to G tube placement  PORTABLE ABDOMEN - 1 VIEW  Comparison: Prior KUB 11/17/2012  Findings: Barium contrast is noted throughout  the small bowel. There is a moderate amount of retained barium in the gastric fundus and body of the stomach.  A nasogastric tube is in place.  The tip projects over the gastric antrum.  There may be a small amount of contrast within the cecum.  No acute osseous abnormality.  No evidence of bowel obstruction.  IMPRESSION:  1.  Barium contrast noted throughout the small bowel with some residual contrast in the stomach. 2.  Recommend repeat KUB in 24 hours.   Original Report Authenticated By: Malachy Moan, M.D.   Dg Swallowing Func-speech Pathology  11/19/2012   Lenor Derrick, CCC-SLP     11/19/2012  2:59 PM Objective Swallowing Evaluation: Modified Barium Swallowing Study   Patient Details  Name: ERNEST POPOWSKI MRN: [id number] Date of Birth: [birth year]  Today's Date: 11/19/2012 Time: 4098-1191 SLP Time Calculation (min): 32 min  Past Medical History: History reviewed. No pertinent past medical  history. Past Surgical History: History reviewed. No pertinent past  surgical history. HPI:  DARELD MCAULIFFE is an 57 y.o. male with recent Cardiac arrest  and MI. Resp failure s/p tracheostomy, making slow progress.  Currently tolerating TF via NGT, but IR is requested to place  perc G-tube for expected prolonged dysphagia and PCM during  recovery.     Assessment / Plan / Recommendation Clinical Impression  Dysphagia Diagnosis: Mild oral phase dysphagia;Moderate  pharyngeal phase dysphagia Clinical impression: Pt. presents with a mild-moderate  oropharyngeal motor dysphagia, as evidenced by decreased bolus  formation and manipulation, decreased hyoid excursion and  laryngeal elevation, resulting in trace aspiration of thin  liquids, which  pt. sensed and coughed aspirate from airway; (1  time penetration of nectar on first swallow) , and difficulty  chewing and manipulating solids in a timely manner.  Pt.'s  cognitive status placed him at a higher risk for aspiration, as  pt. is unable to follow compensatory  strategies and is extremely  impulsive at this time.    Treatment Recommendation  Therapy as outlined in treatment plan below    Diet Recommendation Dysphagia 3 (Mechanical Soft);Nectar-thick  liquid (Chopped meats)   Liquid Administration via: Cup;No straw Medication Administration: Whole meds with puree Supervision: Patient able to self feed;Full supervision/cueing  for compensatory strategies Compensations: Slow rate;Small sips/bites (Place PMSV for all  po's) Postural Changes and/or Swallow Maneuvers: Seated upright 90  degrees    Other  Recommendations Oral Care Recommendations: Oral care  QID;Staff/trained caregiver to provide oral care Other Recommendations: Place PMSV during PO intake;Clarify  dietary restrictions;Have oral suction available;Order thickener  from pharmacy;Prohibited food (jello, ice cream, thin soups)   Follow Up Recommendations  Inpatient Rehab    Frequency and Duration min 2x/week  2 weeks   Pertinent Vitals/Pain n/a    SLP Swallow Goals Patient will consume recommended diet without observed clinical  signs of aspiration with: Minimal assistance;Supervision/safety Patient will utilize recommended strategies during swallow to  increase swallowing safety with: Minimal  assistance;Supervision/safety   General HPI: LAMINE LATON is an 57 y.o. male with recent  Cardiac arrest and MI. Resp failure s/p tracheostomy, making slow  progress. Currently tolerating TF via NGT, but IR is requested to  place perc G-tube for expected prolonged dysphagia and PCM during  recovery. Type of Study: Modified Barium Swallowing Study Reason for Referral: Objectively evaluate swallowing function Previous Swallow Assessment: BSE 11/17/12, Cont. NPO Diet Prior to this Study: NPO Temperature Spikes Noted: No Respiratory Status: Trach Trach Size and Type: #6;With PMSV in place History of Recent Intubation: Yes Length of Intubations (days): 12 days Date extubated: 11/10/12 Behavior/Cognition:  Alert;Cooperative;Pleasant  mood;Distractible;Requires cueing;Decreased sustained attention Oral Cavity - Dentition: Adequate natural dentition Oral Motor / Sensory Function: Within functional limits Self-Feeding Abilities: Able to feed self;Needs assist  (Impulsive) Patient Positioning: Upright in chair Baseline Vocal Quality: Low vocal intensity Volitional Cough: Strong Volitional Swallow: Unable to elicit Anatomy:  (Trach in place) Pharyngeal Secretions: Not observed secondary MBS (Coughing  thick, tan, secretions (malordorous))    Reason for Referral Objectively evaluate swallowing function   Oral Phase Oral Preparation/Oral Phase Oral Phase: Impaired Oral - Solids Oral - Puree: Impaired mastication;Weak lingual  manipulation;Piecemeal swallowing;Delayed oral transit Oral - Mechanical Soft: Impaired mastication;Weak lingual  manipulation;Piecemeal swallowing;Delayed oral transit Oral - Pill:  (Chewed pill)   Pharyngeal Phase Pharyngeal Phase Pharyngeal Phase: Impaired Pharyngeal - Nectar Pharyngeal - Nectar Teaspoon: Reduced epiglottic  inversion;Reduced anterior laryngeal mobility;Reduced laryngeal  elevation;Reduced airway/laryngeal closure;Reduced tongue base  retraction;Penetration/Aspiration during swallow (Initial swallow  only) Penetration/Aspiration details (nectar teaspoon): Material enters  airway, remains ABOVE vocal cords then ejected out (Initial  swallow only) Pharyngeal - Thin Pharyngeal - Thin Cup: Reduced epiglottic inversion;Reduced  anterior laryngeal mobility;Reduced laryngeal  elevation;Penetration/Aspiration during swallow;Trace aspiration Penetration/Aspiration details (thin cup): Material enters  airway, passes BELOW cords then ejected out Pharyngeal Phase - Comment Pharyngeal Comment: Positive cough response with aspiration, and  aspirate was cleared trom the trachea.  Cervical Esophageal Phase    GO    Cervical Esophageal Phase Cervical Esophageal Phase: Vicente Masson T  11/19/2012, 2:59 PM  Assessment/Plan: Diagnosis: severe deconditioning and anoxic encephalopathy after cardiac arrest/post-op VDRF 1. Does the need for close, 24 hr/day medical supervision in concert with the patient's rehab needs make it unreasonable for this patient to be served in a less intensive setting? Yes 2. Co-Morbidities requiring supervision/potential complications: trach, confusion, UTI, CAD 3. Due to bladder management, bowel management, safety, skin/wound care, disease management, medication administration, pain management and patient education, does the patient require 24 hr/day rehab nursing? Yes 4. Does the patient require coordinated care of a physician, rehab nurse, PT (1-2 hrs/day, 5 days/week), OT (1-2 hrs/day, 5 days/week) and SLP (1-2 hrs/day, 5 days/week) to address physical and functional deficits in the context of the above medical diagnosis(es)? Yes Addressing deficits in the following areas: balance, endurance, locomotion, strength, transferring, bowel/bladder control, bathing, dressing, feeding, grooming, toileting, cognition, speech, language, swallowing and psychosocial support 5. Can the patient actively participate in an intensive therapy program of at least 3 hrs of therapy per day at least 5 days per week? Yes 6. The potential for patient to make measurable gains while on inpatient rehab is excellent 7. Anticipated functional outcomes upon discharge from inpatient rehab are supervision to min assist with PT, supervision to min assist with OT, supervision to min assist with SLP. 8. Estimated rehab length of stay to reach the above functional goals is: 3 weeks 9. Does the patient have adequate social supports to accommodate these discharge functional goals? Yes/potentially 10. Anticipated D/C setting: Home 11. Anticipated post D/C treatments: HH therapy 12. Overall Rehab/Functional Prognosis: good  RECOMMENDATIONS: This patient's condition is appropriate for  continued rehabilitative care in the following setting: CIR Patient has agreed to participate in recommended program. Potentially Note that insurance prior authorization may be required for reimbursement for recommended care.  Comment:Rehab RN to follow up.   Ranelle Oyster, MD, [place] [facility]     11/19/2012

## 2012-11-19 NOTE — Evaluation (Signed)
Physical Therapy Evaluation Patient Details Name: Walter Frazier MRN: [id number] DOB: 06/02/1956 Today's Date: 11/19/2012 Time: 4098-1191 PT Time Calculation (min): 21 min  PT Assessment / Plan / Recommendation Clinical Impression  Pt is a 57 yo male s/p STEMI with 15-20 mins of CPR follwed by 3 rounds of shock with AED. Pt with limited cognitive ability to follow commands and participate in therapy. Pt requires tactile cues and weight shift encouragement to transfer and take steps. Pt's movement is further limited by ataxia limiting hip and knee flexion and weight shift. Pt will benefit from CIR to progress mobility, address ataxic movement patterns and return to mod I for ADLs.     PT Assessment  Patient needs continued PT services    Follow Up Recommendations  CIR    Does the patient have the potential to tolerate intense rehabilitation      Barriers to Discharge        Equipment Recommendations  Rolling walker with 5" wheels    Recommendations for Other Services Rehab consult   Frequency Min 3X/week    Precautions / Restrictions Precautions Precautions: Fall Restrictions Weight Bearing Restrictions: No   Pertinent Vitals/Pain Pt denies pain but questionable cognitive ability to accurately assess      Mobility  Bed Mobility Bed Mobility: Rolling Right;Right Sidelying to Sit Rolling Right: 1: +2 Total assist Rolling Right: Patient Percentage: 70% Right Sidelying to Sit: 1: +2 Total assist Right Sidelying to Sit: Patient Percentage: 40% Details for Bed Mobility Assistance: Pt is able to move spontaneously in the bed but is unable to follow commands or tactile cues to sit from side lying. Pt requiring A to move legs and lift trunk Transfers Transfers: Sit to Stand;Stand to Sit;Stand Pivot Transfers Sit to Stand: 2: Max assist;From bed Stand to Sit: 1: +2 Total assist Stand to Sit: Patient Percentage: 40% Stand Pivot Transfers: 1: +2 Total assist Stand Pivot  Transfers: Patient Percentage: 40% Details for Transfer Assistance: Pt with significant posterior lean and ataxia. Pt is unable to advance feet, requiring assistance to remain upright, move walker and cue weightshift . Attempted to take steps with bilat knees locked in extension and bilat feet in plantarflexion, weight-bearing on toes. Pt unable to sequence stepping pattern. Pt unable to hold onto walker with R UE without tactile cueing.   Exercises     PT Diagnosis: Generalized weakness;Abnormality of gait;Difficulty walking  PT Problem List: Decreased activity tolerance;Decreased balance;Decreased mobility;Decreased strength;Decreased range of motion;Decreased coordination;Decreased knowledge of use of DME;Decreased cognition;Decreased safety awareness;Cardiopulmonary status limiting activity PT Treatment Interventions: DME instruction;Stair training;Gait training;Therapeutic activities;Functional mobility training;Therapeutic exercise;Balance training;Neuromuscular re-education;Cognitive remediation   PT Goals Acute Rehab PT Goals PT Goal Formulation: Patient unable to participate in goal setting Time For Goal Achievement: 12/03/12 Potential to Achieve Goals: Fair Pt will go Supine/Side to Sit: with modified independence;with HOB not 0 degrees (comment degree) PT Goal: Supine/Side to Sit - Progress: Goal set today Pt will go Sit to Stand: with min assist;with upper extremity assist PT Goal: Sit to Stand - Progress: Goal set today Pt will Transfer Bed to Chair/Chair to Bed: with min assist PT Transfer Goal: Bed to Chair/Chair to Bed - Progress: Goal set today Pt will Stand: with modified independence;1 - 2 min;with bilateral upper extremity support PT Goal: Stand - Progress: Goal set today Pt will Ambulate: >150 feet;with least restrictive assistive device;with min assist PT Goal: Ambulate - Progress: Goal set today  Visit Information  Last PT Received On: 11/19/12 Assistance  Needed: +3  or more (for ambulation)    Subjective Data  Subjective: Pt recieved in bed, touching NG tube   Prior Functioning  Home Living - per case management pt's spouse is avail 24/7 and was independent PTA Additional Comments: Pt is a poor historian Prior Function Comments: Patient not able to communicate PLOF- pt did shake head yes to all questions but unclear of accuracy Communication Communication: Tracheostomy;Receptive difficulties;Expressive difficulties    Cognition  Cognition Arousal/Alertness: Lethargic Behavior During Therapy: Impulsive Overall Cognitive Status: Impaired/Different from baseline Area of Impairment: Orientation;Attention;Following commands;Memory;Safety/judgement;Awareness;Problem solving Orientation Level: Disoriented to;Place;Situation;Time Current Attention Level: Focused Memory: Decreased short-term memory Following Commands: Follows one step commands inconsistently Safety/Judgement: Decreased awareness of deficits;Decreased awareness of safety Problem Solving: Slow processing;Decreased initiation;Difficulty sequencing;Requires tactile cues General Comments: Pt does not respond to verbal cues, requires tactile cues    Extremity/Trunk Assessment Right Upper Extremity Assessment RUE ROM/Strength/Tone: Unable to fully assess;Due to impaired cognition Left Upper Extremity Assessment LUE ROM/Strength/Tone: Unable to fully assess;Due to impaired cognition Right Lower Extremity Assessment RLE ROM/Strength/Tone: Unable to fully assess;Due to impaired cognition Left Lower Extremity Assessment LLE ROM/Strength/Tone: Unable to fully assess;Due to impaired cognition   Balance Balance Balance Assessed: Yes Static Standing Balance Static Standing - Balance Support: No upper extremity supported Static Standing - Level of Assistance: 1: +1 Total assist Static Standing - Comment/# of Minutes: 2 min, pt requiring +1 total assist to remain upright  End of Session PT - End  of Session Equipment Utilized During Treatment: Gait belt;Oxygen (FIO2 28% via trach collar) Activity Tolerance:  (Cognition and ataxia limiting participation) Patient left: in chair;with call bell/phone within reach (sitter in room)  GP     11/19/2012, 11:25 AM Marvis Moeller, Student Physical Therapist Office #: [phone]   Agree with above assessment  Lewis Shock, PT, DPT Pager #: [phone] Office #: [phone]

## 2012-11-19 NOTE — Procedures (Signed)
Objective Swallowing Evaluation: Modified Barium Swallowing Study  Patient Details  Name: Walter Frazier MRN: [id number] Date of Birth: 1956-03-27  Today's Date: 11/19/2012 Time: 4098-1191 SLP Time Calculation (min): 32 min  Past Medical History: History reviewed. No pertinent past medical history. Past Surgical History: History reviewed. No pertinent past surgical history. HPI:  Walter Frazier is an 57 y.o. male with recent Cardiac arrest and MI. Resp failure s/p tracheostomy, making slow progress. Currently tolerating TF via NGT, but IR is requested to place perc G-tube for expected prolonged dysphagia and PCM during recovery.     Assessment / Plan / Recommendation Clinical Impression  Dysphagia Diagnosis: Mild oral phase dysphagia;Moderate pharyngeal phase dysphagia Clinical impression: Pt. presents with a mild-moderate oropharyngeal motor dysphagia, as evidenced by decreased bolus formation and manipulation, decreased hyoid excursion and laryngeal elevation, resulting in trace aspiration of thin liquids, which pt. sensed and coughed aspirate from airway; (1 time penetration of nectar on first swallow) , and difficulty chewing and manipulating solids in a timely manner.  Pt.'s cognitive status placed him at a higher risk for aspiration, as pt. is unable to follow compensatory strategies and is extremely impulsive at this time.    Treatment Recommendation  Therapy as outlined in treatment plan below    Diet Recommendation Dysphagia 3 (Mechanical Soft);Nectar-thick liquid (Chopped meats)   Liquid Administration via: Cup;No straw Medication Administration: Whole meds with puree Supervision: Patient able to self feed;Full supervision/cueing for compensatory strategies Compensations: Slow rate;Small sips/bites (Place PMSV for all po's) Postural Changes and/or Swallow Maneuvers: Seated upright 90 degrees    Other  Recommendations Oral Care Recommendations: Oral care QID;Staff/trained  caregiver to provide oral care Other Recommendations: Place PMSV during PO intake;Clarify dietary restrictions;Have oral suction available;Order thickener from pharmacy;Prohibited food (jello, ice cream, thin soups)   Follow Up Recommendations  Inpatient Rehab    Frequency and Duration min 2x/week  2 weeks   Pertinent Vitals/Pain n/a    SLP Swallow Goals Patient will consume recommended diet without observed clinical signs of aspiration with: Minimal assistance;Supervision/safety Patient will utilize recommended strategies during swallow to increase swallowing safety with: Minimal assistance;Supervision/safety   General HPI: Walter Frazier is an 57 y.o. male with recent Cardiac arrest and MI. Resp failure s/p tracheostomy, making slow progress. Currently tolerating TF via NGT, but IR is requested to place perc G-tube for expected prolonged dysphagia and PCM during recovery. Type of Study: Modified Barium Swallowing Study Reason for Referral: Objectively evaluate swallowing function Previous Swallow Assessment: BSE 11/17/12, Cont. NPO Diet Prior to this Study: NPO Temperature Spikes Noted: No Respiratory Status: Trach Trach Size and Type: #6;With PMSV in place History of Recent Intubation: Yes Length of Intubations (days): 12 days Date extubated: 11/10/12 Behavior/Cognition: Alert;Cooperative;Pleasant mood;Distractible;Requires cueing;Decreased sustained attention Oral Cavity - Dentition: Adequate natural dentition Oral Motor / Sensory Function: Within functional limits Self-Feeding Abilities: Able to feed self;Needs assist (Impulsive) Patient Positioning: Upright in chair Baseline Vocal Quality: Low vocal intensity Volitional Cough: Strong Volitional Swallow: Unable to elicit Anatomy:  (Trach in place) Pharyngeal Secretions: Not observed secondary MBS (Coughing thick, tan, secretions (malordorous))    Reason for Referral Objectively evaluate swallowing function   Oral Phase  Oral Preparation/Oral Phase Oral Phase: Impaired Oral - Solids Oral - Puree: Impaired mastication;Weak lingual manipulation;Piecemeal swallowing;Delayed oral transit Oral - Mechanical Soft: Impaired mastication;Weak lingual manipulation;Piecemeal swallowing;Delayed oral transit Oral - Pill:  (Chewed pill)   Pharyngeal Phase Pharyngeal Phase Pharyngeal Phase: Impaired Pharyngeal - Nectar Pharyngeal - Nectar Teaspoon:  Reduced epiglottic inversion;Reduced anterior laryngeal mobility;Reduced laryngeal elevation;Reduced airway/laryngeal closure;Reduced tongue base retraction;Penetration/Aspiration during swallow (Initial swallow only) Penetration/Aspiration details (nectar teaspoon): Material enters airway, remains ABOVE vocal cords then ejected out (Initial swallow only) Pharyngeal - Thin Pharyngeal - Thin Cup: Reduced epiglottic inversion;Reduced anterior laryngeal mobility;Reduced laryngeal elevation;Penetration/Aspiration during swallow;Trace aspiration Penetration/Aspiration details (thin cup): Material enters airway, passes BELOW cords then ejected out Pharyngeal Phase - Comment Pharyngeal Comment: Positive cough response with aspiration, and aspirate was cleared trom the trachea.  Cervical Esophageal Phase    GO    Cervical Esophageal Phase Cervical Esophageal Phase: Vicente Masson T 11/19/2012, 2:59 PM

## 2012-11-19 NOTE — Evaluation (Signed)
Physical Therapy Evaluation Patient Details Name: Walter Frazier MRN: [id number] DOB: [birth year] Today's Date: 11/19/2012 Time: 8295-6213 PT Time Calculation (min): 21 min  PT Assessment / Plan / Recommendation Clinical Impression  Pt is a 57 yo male s/p STEMI with 15-20 mins of CPR follwed by 3 rounds of shock with AED. Pt with limited cognitive ability to follow commands and participate in therapy. Pt requires tactile cues and weight shift encouragement to transfer and take steps. Pt's movement is further limited by ataxia limiting hip and knee flexion and weight shift. Pt will benefit from CIR to progress mobility, address ataxic movement patterns and return to mod I for ADLs.     PT Assessment  Patient needs continued PT services    Follow Up Recommendations  CIR    Does the patient have the potential to tolerate intense rehabilitation      Barriers to Discharge        Equipment Recommendations  Rolling walker with 5" wheels    Recommendations for Other Services Rehab consult   Frequency Min 3X/week    Precautions / Restrictions Precautions Precautions: Fall Restrictions Weight Bearing Restrictions: No   Pertinent Vitals/Pain Pt denies pain but questionable cognitive ability to accurately assess      Mobility  Bed Mobility Bed Mobility: Rolling Right;Right Sidelying to Sit Rolling Right: 1: +2 Total assist Rolling Right: Patient Percentage: 70% Right Sidelying to Sit: 1: +2 Total assist Right Sidelying to Sit: Patient Percentage: 40% Details for Bed Mobility Assistance: Pt is able to move spontaneously in the bed but is unable to follow commands or tactile cues to sit from side lying. Pt requiring A to move legs and lift trunk Transfers Transfers: Sit to Stand;Stand to Sit;Stand Pivot Transfers Sit to Stand: 2: Max assist;From bed Stand to Sit: 1: +2 Total assist Stand to Sit: Patient Percentage: 40% Stand Pivot Transfers: 1: +2 Total assist Stand Pivot  Transfers: Patient Percentage: 40% Details for Transfer Assistance: Pt with significant posterior lean and ataxia. Pt is unable to advance feet, requiring assistance to remain upright, move walker and cue weightshift    Exercises     PT Diagnosis: Generalized weakness;Abnormality of gait;Difficulty walking  PT Problem List: Decreased activity tolerance;Decreased balance;Decreased mobility;Decreased strength;Decreased range of motion;Decreased coordination;Decreased knowledge of use of DME;Decreased cognition;Decreased safety awareness;Cardiopulmonary status limiting activity PT Treatment Interventions: DME instruction;Stair training;Gait training;Therapeutic activities;Functional mobility training;Therapeutic exercise;Balance training;Neuromuscular re-education;Cognitive remediation   PT Goals Acute Rehab PT Goals PT Goal Formulation: Patient unable to participate in goal setting Time For Goal Achievement: 12/03/12 Potential to Achieve Goals: Fair Pt will go Supine/Side to Sit: with modified independence;with HOB not 0 degrees (comment degree) PT Goal: Supine/Side to Sit - Progress: Goal set today Pt will go Sit to Stand: with min assist;with upper extremity assist PT Goal: Sit to Stand - Progress: Goal set today Pt will Transfer Bed to Chair/Chair to Bed: with min assist PT Transfer Goal: Bed to Chair/Chair to Bed - Progress: Goal set today Pt will Stand: with modified independence;1 - 2 min;with bilateral upper extremity support PT Goal: Stand - Progress: Goal set today Pt will Ambulate: >150 feet;with least restrictive assistive device;with min assist PT Goal: Ambulate - Progress: Goal set today  Visit Information  Last PT Received On: 11/19/12 Assistance Needed: +3 or more (for ambulation)    Subjective Data  Subjective: Pt recieved in bed, touching NG tube   Prior Functioning  Home Living Additional Comments: Pt is a poor historian Prior Function  Comments: Patient not able to  communicate PLOF Communication Communication: Tracheostomy;Receptive difficulties;Expressive difficulties    Cognition  Cognition Arousal/Alertness: Lethargic Behavior During Therapy: Impulsive Overall Cognitive Status: Impaired/Different from baseline Area of Impairment: Orientation;Attention;Following commands;Memory;Safety/judgement;Awareness;Problem solving Orientation Level: Disoriented to;Place;Situation;Time Current Attention Level: Focused Memory: Decreased short-term memory Following Commands: Follows one step commands inconsistently Safety/Judgement: Decreased awareness of deficits;Decreased awareness of safety Problem Solving: Slow processing;Decreased initiation;Difficulty sequencing;Requires tactile cues General Comments: Pt does not respond to verbal cues, requires tactile cues    Extremity/Trunk Assessment Right Upper Extremity Assessment RUE ROM/Strength/Tone: Unable to fully assess;Due to impaired cognition Left Upper Extremity Assessment LUE ROM/Strength/Tone: Unable to fully assess;Due to impaired cognition Right Lower Extremity Assessment RLE ROM/Strength/Tone: Unable to fully assess;Due to impaired cognition Left Lower Extremity Assessment LLE ROM/Strength/Tone: Unable to fully assess;Due to impaired cognition   Balance Balance Balance Assessed: Yes Static Standing Balance Static Standing - Balance Support: No upper extremity supported Static Standing - Level of Assistance: 1: +1 Total assist Static Standing - Comment/# of Minutes: 2 min, pt requiring +1 total assist to remain upright  End of Session PT - End of Session Equipment Utilized During Treatment: Gait belt;Oxygen (FIO2 28% via trach collar) Activity Tolerance:  (Cognition and ataxia limiting participation) Patient left: in chair;with call bell/phone within reach (sitter in room)  GP     11/19/2012, 11:25 AM Marvis Moeller, Student Physical Therapist Office #: [phone]

## 2012-11-19 NOTE — Progress Notes (Signed)
Passy-Muir Speaking Valve - Treatment Patient Details  Name: Walter Frazier MRN: [id number] Date of Birth: [birth year]  Today's Date: 11/19/2012 Time: 4098-1191 SLP Time Calculation (min): 32 min  Past Medical History: History reviewed. No pertinent past medical history. Past Surgical History: History reviewed. No pertinent past surgical history.  Assessment / Plan / Recommendation Clinical Impression  Pt.'s trach has been downsized to #6.  Phone call per RN re: reassessment with PMV and Swallow evaluation.  Pt. was able to tolerate the PMV for 20 minutes, with clear voice quality, but somewhat decreased breath support for speech, resulting in low volume/intensity.  Pt. happy to hear his voice (smiled) and wife very excited.  Pt. was oriented to self and stated his wife's name, but did not know this is a hospital.  Pt. was oriented to "June" but not year.  All  VSS remained stable.  Will proceed with MBS.    Plan  Goals updated    Follow Up Recommendations  Inpatient Rehab    Pertinent Vitals/Pain n/a    SLP Goals Potential to Achieve Goals: Good Progress/Goals/Alternative treatment plan discussed with pt/caregiver and they: Agree SLP Goal #1 - Progress: Met SLP Goal #2 - Progress: Met SLP Goal #3 - Progress: Progressing toward goal SLP Goal #4: Pt. will wear PMSV 2-4 hours with all VSS. SLP Goal #5: Pt. will increase volume/intensity utilizing appropriate breath support for speech with min. cues.   PMSV Trial  PMSV was placed for:  minutes Able to redirect subglottic air through upper airway: Yes Able to Attain Phonation: Yes Voice Quality: Low vocal intensity Able to Expectorate Secretions: Yes Breath Support for Phonation: Moderately decreased Intelligibility: Intelligibility reduced Word: 75-100% accurate Phrase: 75-100% accurate Sentence: 75-100% accurate Conversation: 50-74% accurate Respirations During Trial: 21 SpO2 During Trial: 98 % Pulse During Trial:  81 Behavior: Alert;Confused;Good eye contact;Labile;Smiling;Responsive to questions   Tracheostomy Tube       Vent Dependency  FiO2 (%): 21 %    Cuff Deflation Trial  GO         Antavious Spanos T 11/19/2012, 1:53 PM

## 2012-11-20 DIAGNOSIS — G931 Anoxic brain damage, not elsewhere classified: Secondary | ICD-10-CM

## 2012-11-20 LAB — BASIC METABOLIC PANEL
CO2: 25 mEq/L (ref 19–32)
Chloride: 103 mEq/L (ref 96–112)
Creatinine, Ser: 0.62 mg/dL (ref 0.50–1.35)

## 2012-11-20 LAB — GLUCOSE, CAPILLARY
Glucose-Capillary: 132 mg/dL — ABNORMAL HIGH (ref 70–99)
Glucose-Capillary: 129 mg/dL — ABNORMAL HIGH (ref 70–99)

## 2012-11-20 MED ORDER — CLONAZEPAM 1 MG PO TABS: 0.5000 mg | ORAL_TABLET | Freq: Every day | ORAL | Status: DC

## 2012-11-20 MED ORDER — HALOPERIDOL LACTATE 5 MG/ML IJ SOLN
1.0000 mg | Freq: Four times a day (QID) | INTRAMUSCULAR | Status: DC | PRN
  Administered 2012-11-20: 2 mg via INTRAVENOUS
  Filled 2012-11-20: qty 1

## 2012-11-20 NOTE — Progress Notes (Addendum)
Speech Language Pathology Dysphagia and PMSVTreatment Patient Details Name: Walter Frazier MRN: [id number] DOB: [birth year] Today's Date: 11/20/2012 Time: 4098-1191 SLP Time Calculation (min): 27 min  Assessment / Plan / Recommendation Clinical Impression  Treatment focused on safety of diet texture/liquid recommendations wearing PMSV.  Pt. coughed valve off trach hub x 3 after one minute donned from what appeared to be increased sensitivity with valve versus C02 retention.  Pt. required max verbal/tactile/visual cues to increase inhaled air for increased vocal intensity and duration of verbalizations.  All vital signs remained stable.  He tolerated nectar V-8 juice and pills crushed in applesauce without difficutly or s/s aspiration.  Provided continued education to RN and pt. regarding clinical benefits of valve and importance of use during all meals and meds.  Pt. with evident cognitive deficits.  He may be transferring to CIR sometime next week.  ST will continue to follow for dysphagia, PMSV and cognitive impairments.    Diet Recommendation  Continue with Current Diet: Dysphagia 1 (puree);Nectar-thick liquid    SLP Plan Continue with current plan of care   Pertinent Vitals/Pain none   Swallowing Goals  SLP Swallowing Goals Patient will consume recommended diet without observed clinical signs of aspiration with: Minimal assistance;Supervision/safety Swallow Study Goal #1 - Progress: Progressing toward goal Patient will utilize recommended strategies during swallow to increase swallowing safety with: Minimal assistance;Supervision/safety Swallow Study Goal #2 - Progress: Progressing toward goal  General Temperature Spikes Noted: No Respiratory Status: Trach Behavior/Cognition: Alert;Cooperative;Pleasant mood;Requires cueing;Decreased sustained attention Oral Cavity - Dentition: Adequate natural dentition Patient Positioning: Upright in bed  Oral Cavity - Oral Hygiene Does patient  have any of the following "at risk" factors?: Oxygen therapy - cannula, mask, simple oxygen devices;Other - dysphagia;Diet - patient on thickened liquids Brush patient's teeth BID with toothbrush (using toothpaste with fluoride): Yes Patient is HIGH RISK - Oral Care Protocol followed (see row info): Yes   Dysphagia Treatment Treatment focused on: Skilled observation of diet tolerance Treatment Methods/Modalities: Skilled observation Patient observed directly with PO's: Yes Type of PO's observed: Nectar-thick liquids;Dysphagia 1 (puree) Feeding: Total assist (wearing bilateran hand restraints) Liquids provided via: Cup;No straw Pharyngeal Phase Signs & Symptoms: Suspected delayed swallow initiation Type of cueing: Verbal;Tactile Amount of cueing: Moderate   GO     Breck Coons [place].Ed [employer] [phone]  11/20/2012

## 2012-11-20 NOTE — Progress Notes (Signed)
Physical Therapy Treatment Patient Details Name: Walter Frazier MRN: [id number] DOB: 05-May-1956 Today's Date: 11/20/2012 Time: 7829-5621 PT Time Calculation (min): 32 min  PT Assessment / Plan / Recommendation Comments on Treatment Session  Pt able to make good progress with mobility today but is still very limited by cognitive deficits.  Requires max cueing & requires mostly tactile cues for him to be able to follow.      Follow Up Recommendations  CIR     Does the patient have the potential to tolerate intense rehabilitation     Barriers to Discharge        Equipment Recommendations  Rolling walker with 5" wheels    Recommendations for Other Services Rehab consult  Frequency Min 3X/week   Plan Discharge plan remains appropriate;Frequency remains appropriate    Precautions / Restrictions Precautions Precautions: Fall Restrictions Weight Bearing Restrictions: No   Pertinent Vitals/Pain     Mobility  Bed Mobility Bed Mobility: Supine to Sit;Sitting - Scoot to Edge of Bed Supine to Sit: 4: Min assist;HOB flat Sitting - Scoot to [place] of Bed: 3: Mod assist Details for Bed Mobility Assistance: Pt is able to move spontaneously in the bed but is unable to follow commands or tactile cues to sit from side lying. Pt requiring A to move legs and lift trunk Transfers Transfers: Sit to Stand;Stand to Sit;Stand Pivot Transfers Sit to Stand: 1: +2 Total assist;With upper extremity assist;From bed;From chair/3-in-1;With armrests Sit to Stand: Patient Percentage: 50% Stand to Sit: 1: +2 Total assist;With upper extremity assist;With armrests;To chair/3-in-1 Stand to Sit: Patient Percentage: 40% Stand Pivot Transfers: 1: +2 Total assist Stand Pivot Transfers: Patient Percentage: 60% Details for Transfer Assistance: Pt cont's with significant posterior lean but he was able to move his feet.  Pt requires max cueing for sequencing & technique.  Performed with bil HHA today.    Ambulation/Gait Ambulation/Gait Assistance: 1: +2 Total assist Ambulation/Gait: Patient Percentage: 60% Ambulation Distance (Feet): 140 Feet Assistive device: 2 person hand held assist Ambulation/Gait Assistance Details: (A) for balance & safety.  Pt with posterior lean but as he begins to walk he improves with ability to transition weight forwards.  Narrow BOS but no scissoring noted.   Gait Pattern: Step-through pattern;Decreased stride length;Narrow base of support;Decreased hip/knee flexion - right;Decreased hip/knee flexion - left Stairs: No Wheelchair Mobility Wheelchair Mobility: No      PT Goals Acute Rehab PT Goals Time For Goal Achievement: 12/03/12 Potential to Achieve Goals: Fair Pt will go Supine/Side to Sit: with modified independence;with HOB not 0 degrees (comment degree) PT Goal: Supine/Side to Sit - Progress: Progressing toward goal Pt will go Sit to Stand: with min assist;with upper extremity assist PT Goal: Sit to Stand - Progress: Progressing toward goal Pt will Transfer Bed to Chair/Chair to Bed: with min assist PT Transfer Goal: Bed to Chair/Chair to Bed - Progress: Progressing toward goal Pt will Stand: with modified independence;1 - 2 min;with bilateral upper extremity support PT Goal: Stand - Progress: Progressing toward goal Pt will Ambulate: >150 feet;with least restrictive assistive device;with min assist PT Goal: Ambulate - Progress: Progressing toward goal  Visit Information  Last PT Received On: 11/20/12 Assistance Needed: +3 or more (ambulation)    Subjective Data      Cognition  Cognition Arousal/Alertness: Awake/alert Behavior During Therapy: Flat affect Overall Cognitive Status: Impaired/Different from baseline Area of Impairment: Orientation;Attention;Following commands;Safety/judgement;Awareness;Problem solving Orientation Level: Disoriented to;Place;Time;Situation Current Attention Level: Focused Memory: Decreased recall of  precautions Following  Commands: Follows one step commands inconsistently Safety/Judgement: Decreased awareness of safety;Decreased awareness of deficits Problem Solving: Slow processing;Decreased initiation;Difficulty sequencing;Requires verbal cues;Requires tactile cues General Comments: Pt does not respond to verbal cues, requires tactile cues    Balance  Static Standing Balance Static Standing - Balance Support: During functional activity Static Standing - Level of Assistance: 2: Max assist Static Standing - Comment/# of Minutes: (A) for balance due to posterior lean while washing hands at sink.    End of Session PT - End of Session Equipment Utilized During Treatment: Gait belt;Oxygen Activity Tolerance: Patient tolerated treatment well Patient left: in chair;with call bell/phone within reach Nurse Communication: Mobility status     Verdell Face, [place] [phone] 11/20/2012

## 2012-11-20 NOTE — Progress Notes (Signed)
   SUBJECTIVE:  He seems to be more responsive then previous but answers yes no questions by shaking his head.  Still with significant global neuro deficit   PHYSICAL EXAM Filed Vitals:   11/20/12 0600 11/20/12 0745 11/20/12 0858 11/20/12 0920  BP: 119/68 118/73 111/78 111/78  Pulse: 102 103  99  Temp:  98.4 F (36.9 C)    TempSrc:  Oral    Resp: 18 27  14   Height:      Weight:      SpO2: 97% 95%  96%   General:  No acute distress Lungs:  Decreased breath sounds Heart:  RRR Abdomen:  Positive bowel sounds, no rebound no guarding Extremities:  No edema  LABS:  Results for orders placed during the hospital encounter of 10/30/12 (from the past 24 hour(s))  GLUCOSE, CAPILLARY     Status: Abnormal   Collection Time    11/19/12  4:34 PM      Result Value Range   Glucose-Capillary 114 (*) 70 - 99 mg/dL   Comment 1 Notify RN    GLUCOSE, CAPILLARY     Status: Abnormal   Collection Time    11/20/12  1:08 AM      Result Value Range   Glucose-Capillary 132 (*) 70 - 99 mg/dL  BASIC METABOLIC PANEL     Status: Abnormal   Collection Time    11/20/12  5:50 AM      Result Value Range   Sodium 138  135 - 145 mEq/L   Potassium 3.8  3.5 - 5.1 mEq/L   Chloride 103  96 - 112 mEq/L   CO2 25  19 - 32 mEq/L   Glucose, Bld 107 (*) 70 - 99 mg/dL   BUN 14  6 - 23 mg/dL   Creatinine, Ser 4.09  0.50 - 1.35 mg/dL   Calcium 9.0  8.4 - 81.1 mg/dL   GFR calc non Af Amer >90  >90 mL/min   GFR calc Af Amer >90  >90 mL/min  GLUCOSE, CAPILLARY     Status: Abnormal   Collection Time    11/20/12  7:44 AM      Result Value Range   Glucose-Capillary 122 (*) 70 - 99 mg/dL   Comment 1 Notify RN      Intake/Output Summary (Last 24 hours) at 11/20/12 1058 Last data filed at 11/20/12 0600  Gross per 24 hour  Intake      0 ml  Output    628 ml  Net   -628 ml    ASSESSMENT AND PLAN:  CAD:  I have asked for the cath note from [place] to be faxed to review the extent of any residual CAD. However,  given his neuro state it is unlikely that we would do other than medical management.    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  No room to titrate his meds at this point.  Not a [facility] candidate.  Continue current therapy.   Fayrene Fearing [facility] 11/20/2012 10:58 AM

## 2012-11-20 NOTE — Progress Notes (Signed)
Occupational Therapy Evaluation Patient Details Name: Walter Frazier MRN: [id number] DOB: [birth year] Today's Date: 11/20/2012 Time: 4098-1191 OT Time Calculation (min): 28 min  OT Assessment / Plan / Recommendation Clinical Impression  57 y/o admitted to [facility] on 5/29 after cardiac arrest from a STEMI, comatose, started on the hypothermia protocol and transferred to [facility]. 6/10 Trach tube placed. PTA, pt was independent with ADL and mobility and worked full time. Pt has supportive wife who can provide 24/7 assistance after D/C. Pt is excellent CIR candidate and feel that pt can reach S level at CIR to D/C home with wife. Pt will benefit from skilled OT services to facilitate D/C to CIR due to below deficits.    OT Assessment  Patient needs continued OT Services    Follow Up Recommendations  CIR    Barriers to Discharge None    Equipment Recommendations  3 in 1 bedside comode    Recommendations for Other Services Rehab consult  Frequency  Min 3X/week    Precautions / Restrictions Precautions Precautions: Fall Precaution Comments: trach Restrictions Weight Bearing Restrictions: No   Pertinent Vitals/Pain Stable. No c/o pain    ADL  Eating/Feeding: Other (comment) Where Assessed - Eating/Feeding:  (not assessed) Grooming: Maximal assistance Where Assessed - Grooming: Supported standing Upper Body Bathing: Maximal assistance Where Assessed - Upper Body Bathing: Supported standing Lower Body Bathing: Maximal assistance Where Assessed - Lower Body Bathing: Supported sit to stand Upper Body Dressing: Maximal assistance Where Assessed - Upper Body Dressing: Supported sitting Lower Body Dressing: Maximal assistance Where Assessed - Lower Body Dressing: Supported sit to Pharmacist, hospital: +2 Total assistance Toilet Transfer: Patient Percentage: 70% Statistician Method: Other (comment) (ambulaing) Toileting - Clothing Manipulation and Hygiene: Maximal assistance Where  Assessed - Toileting Clothing Manipulation and Hygiene: Sit to stand from 3-in-1 or toilet Tub/Shower Transfer: Maximal assistance Equipment Used: Gait belt Transfers/Ambulation Related to ADLs: +2 HHA. TOTAL A +2. Pt @ 70%. ADL Comments: limited by cognitive deficits and generalized weakness. Pt demonstrating motor perseveration apraxia during aDL. REquires hand over hand to terminate activity.    OT Diagnosis: Generalized weakness;Cognitive deficits;Disturbance of vision;Apraxia;Altered mental status  OT Problem List: Decreased strength;Decreased activity tolerance;Impaired balance (sitting and/or standing);Impaired vision/perception;Decreased coordination;Decreased cognition;Decreased safety awareness;Decreased knowledge of use of DME or AE;Decreased knowledge of precautions;Cardiopulmonary status limiting activity;Impaired UE functional use OT Treatment Interventions: Self-care/ADL training;Therapeutic exercise;Neuromuscular education;DME and/or AE instruction;Therapeutic activities;Cognitive remediation/compensation;Visual/perceptual remediation/compensation;Patient/family education;Balance training   OT Goals Acute Rehab OT Goals OT Goal Formulation: With patient/family Time For Goal Achievement: 12/04/12 Potential to Achieve Goals: Good ADL Goals Pt Will Perform Grooming: with min assist;Unsupported;Sitting, chair;with cueing (comment type and amount) (mn tactile cues for task termination and initiation in nondi) ADL Goal: Grooming - Progress: Goal set today Pt Will Perform Upper Body Bathing: with min assist;Sitting, chair (with min tactile cues and min vc for redirection) ADL Goal: Upper Body Bathing - Progress: Goal set today Pt Will Perform Upper Body Dressing: with mod assist;Sitting, chair;with cueing (comment type and amount) (mod tactile cues for attention for sustained attentiona nd m) ADL Goal: Upper Body Dressing - Progress: Goal set today Pt Will Transfer to Toilet: with min  assist;Ambulation;with DME;3-in-1 ADL Goal: Toilet Transfer - Progress: Goal set today Additional ADL Goal #1: Pt will demonstrate emergent awreness during ADL with min tactile.vc. ADL Goal: Additional Goal #1 - Progress: Goal set today  Visit Information  Last OT Received On: 11/20/12 Assistance Needed: +2 PT/OT Co-Evaluation/Treatment: Yes  Subjective Data      Prior Functioning     Home Living Lives With: Spouse;Family;Other (Comment) (wife takes care of her mother) Available Help at Discharge: Family;Available 24 hours/day Type of Home: House Home Access: Stairs to enter [employer] of Steps: 3 Entrance Stairs-Rails: None Home Layout: One level Bathroom Shower/Tub: Health visitor: Standard Bathroom Accessibility: Yes How Accessible: Accessible via walker Home Adaptive Equipment: Shower chair with back Prior Function Level of Independence: Independent Able to Take Stairs?: Yes Driving: Yes Vocation: Full time employment Communication Communication: Tracheostomy;Receptive difficulties;Expressive difficulties Dominant Hand: Right         Vision/Perception Vision - History Baseline Vision: No visual deficits Patient Visual Report: Other (comment) (will further assess) Perception Perception: Impaired Figure Ground: difficulty accurately reaching for targets Spatial Orientation: undershooting when reaching. ? perceptual deficits Praxis Praxis: Impaired Praxis Impairment Details: Initiation;Motor planning;Perseveration Praxis-Other Comments: Apparent ideomotor apraxia. perseveration. tactile cues for initiation   Cognition  Cognition Arousal/Alertness: Awake/alert Behavior During Therapy: Flat affect Overall Cognitive Status: Impaired/Different from baseline Area of Impairment: Orientation;Attention;Memory;Following commands;Safety/judgement;Awareness;Problem solving Orientation Level: Disoriented to;Place;Time;Situation Current  Attention Level: Focused (easily distracted) Memory: Decreased recall of precautions Following Commands: Follows one step commands inconsistently Safety/Judgement: Decreased awareness of safety;Decreased awareness of deficits Awareness: Intellectual Problem Solving: Slow processing;Decreased initiation;Difficulty sequencing;Requires verbal cues;Requires tactile cues General Comments: Requires tactile cues to terminate tasks. Showing progress.    Extremity/Trunk Assessment Right Upper Extremity Assessment RUE ROM/Strength/Tone: Deficits RUE ROM/Strength/Tone Deficits: generalized weakness RUE Sensation:  (unable to accurately assess) RUE Coordination: Deficits RUE Coordination Deficits: due to apparent weakness Left Upper Extremity Assessment LUE ROM/Strength/Tone: Deficits LUE ROM/Strength/Tone Deficits: generalized weakness LUE Sensation:  (unable to accurately assess) LUE Coordination: Deficits Trunk Assessment Trunk Assessment: Other exceptions (posterior bias)     Mobility Bed Mobility Bed Mobility: Supine to Sit;Sitting - Scoot to Edge of Bed Supine to Sit: 3: Mod assist;HOB flat Sitting - Scoot to Edge of Bed: 3: Mod assist Details for Bed Mobility Assistance: Assistance primarily due to apraxia Transfers Transfers: Sit to Stand;Stand to Sit Sit to Stand: 1: +2 Total assist Sit to Stand: Patient Percentage: 50% Stand to Sit: 1: +2 Total assist;With upper extremity assist;With armrests;To chair/3-in-1 Stand to Sit: Patient Percentage: 40% Details for Transfer Assistance: Tactile cues to initiate sitting due to perseveration and apraxia     Exercise Other Exercises Other Exercises: encouraged wife to perform general BUE AROM with pt   Balance Balance Balance Assessed: Yes Static Standing Balance Static Standing - Balance Support: During functional activity Static Standing - Level of Assistance: 2: Max assist Static Standing - Comment/# of Minutes: during walking. pt  with posterior lean, howver, improved midline positioning with facilitatiaon   End of Session OT - End of Session Equipment Utilized During Treatment: Gait belt Activity Tolerance: Patient tolerated treatment well Patient left: in chair;with call bell/phone within reach Nurse Communication: Mobility status;Other (comment) (need for chair alarm. need for sitter)  GO     Adline Kirshenbaum,HILLARY 11/20/2012, 1:28 PM [facility], OTR/L  [phone] 11/20/2012

## 2012-11-20 NOTE — Progress Notes (Signed)
Pt. Found incontinent and with trachea out. No resp. Distress 100% pulse oximetry. Resp at bedside. Dr. Vassie Loll notified. Trachea reinserted by resp. And Haldol 2mg  given. Sitter in room for safety. Wife present.

## 2012-11-20 NOTE — Procedures (Signed)
**Note De-Identified Josemaria Brining Obfuscation** Tracheostomy Change Note  Patient Details:   Name: Walter Frazier DOB: 19-Sep-1955 MRN: [id number]    Airway Documentation:     Evaluation  O2 sats: stable throughout Complications: No apparent complications Patient did tolerate procedure well. Bilateral Breath Sounds: Clear;Diminished Suctioning: Airway Patient self-decannulated.  RT replaced  #6 shiley cuffless. Quron Ruddy, Megan Salon 11/20/2012, 4:50 PM

## 2012-11-20 NOTE — Progress Notes (Signed)
PULMONARY  / CRITICAL CARE MEDICINE  Name: Walter Frazier MRN: [id number] DOB: 12-15-1955    ADMISSION DATE:  10/30/2012 CONSULTATION DATE:  10/30/2012  REFERRING MD :  Diona Browner PRIMARY SERVICE: PCCM  CHIEF COMPLAINT:  Cardiac arrest  BRIEF PATIENT DESCRIPTION: 57 y/o male smoker with no known past medical history was admitted to [facility] on 5/29 after cardiac arrest from a STEMI.  He was comatose afterwards and was started on the hypothermia protocol and transferred to [facility].  SIGNIFICANT EVENTS / STUDIES:  5/29 - LHC > DES to prox LAD @ [facility]. IABP placed 5/29 - Transfer to [facility] for hypothermia 5/30 - Echo >> EF 25%, LV severely dilated 6/02 - RN noted ?myoclonus - sedation infusions resumed. Purposeful on WUA but not F/C.  6/02 - Neuro consult: EEG > non-convulsive status epilepticus. Resolution of these discharges after ativan. Loaded with fosphenytoin.  6/02 - CT head: NAD 6/03 - 24 hr EEG:  Diffuse slowing, suggestive of general encephalopathy 6/04 - Started on Keppra 6/06 - daily progress neuro wise, severe agiation 6/07 - weaned for 1 hour on PSV, agitation limiting factor 6/09 Passed SBT, extubated, tolerated approx 3 hrs. Very agitated, tachypneic despite dexmedetomidine. Reintubated. Trach planned 6/10 Trach tube placed 6/11 Foley removed. Urinary retention. Foley replaced. Urethral discharge noted. Cloudy urine. Ceftrx started for UTI 6/13 Transfer to SDU 6/14 Following commands!!! Fent patch, clonazepam doses decreased G tube 6/17>> deferred  LINES / TUBES: IABP 5/29 >> 6/01 ETT 5/29 >> 6/09, 6/09 >> 6/10 L IJ CVL 5/29 >> 6/10 L radial A-line 5/29 >> 6/4 Trach 6/10 >> 6/18 downsized to #6 cuffless PICC 6/10 >>    CULTURES: MRSA PCR 5/29 >> neg Urine 6/12 >> E coli  ANTIBIOTICS: Ceftrx 6/11 >> 6/14  SUBJ/Overnight:  Afebrile Decreased Agitation overnight Passed swallow  PHYSICAL EXAMINATION: Gen: NAD HEENT: Poor dentition, o/w WNL PULM:no rhonchi CV: RRR  s M AB: BS+, soft, nontender, no hsm Ext: warm/dry, no edema Neuro: moves all ext with full strength, + F/C  LABS: BMET    Component Value Date/Time   NA 138 11/20/2012 0550   K 3.8 11/20/2012 0550   CL 103 11/20/2012 0550   CO2 25 11/20/2012 0550   GLUCOSE 107* 11/20/2012 0550   BUN 14 11/20/2012 0550   CREATININE 0.62 11/20/2012 0550   CALCIUM 9.0 11/20/2012 0550   GFRNONAA >90 11/20/2012 0550   GFRAA >90 11/20/2012 0550     CBC    Component Value Date/Time   WBC 10.8* 11/17/2012 0500   RBC 4.04* 11/17/2012 0500   HGB 12.6* 11/17/2012 0500   HCT 36.4* 11/17/2012 0500   PLT 629* 11/17/2012 0500   MCV 90.1 11/17/2012 0500   MCH 31.2 11/17/2012 0500   MCHC 34.6 11/17/2012 0500   RDW 12.7 11/17/2012 0500   LYMPHSABS 2.8 11/17/2012 0500   MONOABS 1.0 11/17/2012 0500   EOSABS 0.5 11/17/2012 0500   BASOSABS 0.1 11/17/2012 0500     ABG    Component Value Date/Time   PHART 7.437 11/11/2012 0430   PCO2ART 41.1 11/11/2012 0430   PO2ART 90.0 11/11/2012 0430   HCO3 27.7* 11/11/2012 0430   TCO2 29 11/11/2012 0430   ACIDBASEDEF 0.6 11/03/2012 0645   O2SAT 97.0 11/11/2012 0430     Recent Labs Lab 11/19/12 0029 11/19/12 0755 11/19/12 1634 11/20/12 0108 11/20/12 0744  GLUCAP 194* 130* 114* 132* 122*    CXR: no new film  ASSESSMENT / PLAN:  CARDIOVASCULAR A:  Cardiac arrest  5/29 - VT/VF Anterior wall STEMI 5/29, prox LAD occlusion - s/p DES PCI @ [facility] Cardiogenic shock, resolved.   HTN  P:  -continue enalapril, ASA, metoprolol -dc clonidine patch  -Cards following  PULMONARY A:  Acute respiratory failure post cardiac arrest LLL Airspace Disease - atelectasis, resolved Failed extubation 6/09 Tracheostomy status P:   -Cont ATC as tolerated - downsized to 6 cuffless    NEUROLOGIC A:   Post anoxic encephalopathy - improving Status epilepticus, resolved Severe agitation - improved Apparent aphasia, resolved   P:   -Cont anticonvulsants - phenytoin and VPA -Cont fentanyl  patch and low dose PRN fentanyl -Cont scheduled clonazepam - started 6/11 reduced by 6/19 to 0.5  mg daily -Cont quetiapine 6/12 - reduced 6/13  RENAL A: Hyponatremia, resolved Hypokalemia, resolved P:   -Correct electrolytes as indicated  GASTROINTESTINAL A:  Tolerating TF P:   -SUP no longer indicated as he is liberated from vent -dys 3 diet -Cont free water -cancel G tube per IR  - since swallow improved   HEMATOLOGIC A:   DVT prevention P:  - resume LMWH    INFECTIOUS E coli UTI  P:   -micro and abx as above   Downsized trach . Dc options SNF, await CIR input  Cyril Mourning MD. FCCP. [facility] Pager [phone] If no response call [phone]

## 2012-11-21 LAB — COMPREHENSIVE METABOLIC PANEL
Albumin: 2.7 g/dL — ABNORMAL LOW (ref 3.5–5.2)
Alkaline Phosphatase: 224 U/L — ABNORMAL HIGH (ref 39–117)
BUN: 14 mg/dL (ref 6–23)
Calcium: 8.8 mg/dL (ref 8.4–10.5)
Creatinine, Ser: 0.73 mg/dL (ref 0.50–1.35)
Potassium: 3.9 mEq/L (ref 3.5–5.1)
Total Protein: 7.8 g/dL (ref 6.0–8.3)

## 2012-11-21 LAB — VALPROIC ACID LEVEL: Valproic Acid Lvl: 20 ug/mL — ABNORMAL LOW (ref 50.0–100.0)

## 2012-11-21 LAB — GLUCOSE, CAPILLARY
Glucose-Capillary: 130 mg/dL — ABNORMAL HIGH (ref 70–99)
Glucose-Capillary: 141 mg/dL — ABNORMAL HIGH (ref 70–99)
Glucose-Capillary: 152 mg/dL — ABNORMAL HIGH (ref 70–99)

## 2012-11-21 LAB — AMMONIA: Ammonia: 42 umol/L (ref 11–60)

## 2012-11-21 MED ORDER — PHENYTOIN 125 MG/5ML PO SUSP
200.0000 mg | Freq: Two times a day (BID) | ORAL | Status: DC
  Administered 2012-11-21 – 2012-11-25 (×9): 200 mg via ORAL
  Filled 2012-11-21 (×10): qty 8

## 2012-11-21 MED ORDER — ENSURE COMPLETE PO LIQD
237.0000 mL | Freq: Three times a day (TID) | ORAL | Status: DC
  Administered 2012-11-21 – 2012-11-25 (×12): 237 mL via ORAL

## 2012-11-21 MED ORDER — METOPROLOL TARTRATE 25 MG PO TABS
25.0000 mg | ORAL_TABLET | Freq: Two times a day (BID) | ORAL | Status: DC
  Administered 2012-11-21 – 2012-11-22 (×4): 25 mg via ORAL
  Filled 2012-11-21 (×6): qty 1

## 2012-11-21 MED ORDER — VALPROIC ACID 250 MG PO CAPS
500.0000 mg | ORAL_CAPSULE | Freq: Two times a day (BID) | ORAL | Status: DC
  Administered 2012-11-21 – 2012-11-25 (×9): 500 mg via ORAL
  Filled 2012-11-21 (×10): qty 2

## 2012-11-21 MED ORDER — ASPIRIN 81 MG PO CHEW
81.0000 mg | CHEWABLE_TABLET | Freq: Every day | ORAL | Status: DC
  Administered 2012-11-21 – 2012-11-25 (×5): 81 mg via ORAL
  Filled 2012-11-21 (×5): qty 1

## 2012-11-21 MED ORDER — ENALAPRIL MALEATE 5 MG PO TABS
5.0000 mg | ORAL_TABLET | Freq: Two times a day (BID) | ORAL | Status: DC
  Administered 2012-11-21 – 2012-11-23 (×6): 5 mg via ORAL
  Filled 2012-11-21 (×9): qty 1

## 2012-11-21 MED ORDER — QUETIAPINE FUMARATE 50 MG PO TABS
50.0000 mg | ORAL_TABLET | Freq: Every day | ORAL | Status: DC
  Administered 2012-11-21 – 2012-11-24 (×4): 50 mg via ORAL
  Filled 2012-11-21 (×5): qty 1

## 2012-11-21 MED ORDER — CLOPIDOGREL BISULFATE 75 MG PO TABS
75.0000 mg | ORAL_TABLET | Freq: Every day | ORAL | Status: DC
  Administered 2012-11-22 – 2012-11-25 (×4): 75 mg via ORAL
  Filled 2012-11-21 (×5): qty 1

## 2012-11-21 NOTE — PMR Pre-admission (Signed)
PMR Admission Coordinator Pre-Admission Assessment  Patient: Walter Frazier is an 57 y.o., male MRN: [id number] DOB: January 01, 1956 Height: 5\' 9"  (175.3 cm) Weight: 69.3 kg (152 lb 12.5 oz)              Insurance Information HMO     PPO: Yes     PCP:       IPA:       80/20:       OTHER:  Group # [id number] PRIMARY: BCBS of [place]      Policy#: [id number]      Subscriber: De Nurse CM Name: Ollen Gross      Phone#: [phone]     Fax#: [phone] Pre-Cert#: [id number]      Employer: Obamacare/temp worker 4 to 5 months Benefits:  Phone #: [phone]     Name: Suan Halter. Date: 10/02/12     Deduct: $5400 (met)      Out of Pocket Max: $12000 (met [id number])      Life Max: None CIR: 50% w/auth      SNF: 50% w/auth  60 days max Outpatient: 50% w/auth     Co-Pay: 50%  30 visit limit Home Health: 50% w/auth      Co-Pay: 50% DME: 50%     Co-Pay: 50% Providers:  In network   Emergency Contact Information Contact Information   Name Relation Home Work Mobile   Whitmer L    [phone]   Alena Bills   [phone]     Current Medical History  Patient Admitting Diagnosis: Severe deconditioning and anoxic encephalopathy after cardiac arrest/post-op VDRF   History of Present Illness:  A 57 y.o. male admitted with prior history of 1 ppd tobacco who had out of hospital cardiac arrest and was taken to [facility]. He was taken the cardiac catheterization lab given ST elevation on ECG in setting of cardiac arrest with had proximal LAD occlusion treated with atherectomy and DES to proximal LAD. Echo with EF 25% with severely dilated LV. Post procedure comatose with acute respiratory failure requiring intubation and treated with hypothermia protocol. He was noted to have posturing post warming and neurology consulted due to concerns of hypoxic injury. CT head without acute changes. EEG consistent with nonconvulsive status epilepticus and patient  started dilantin and keppra. Agitation treated with propofol. He failed extubation requiring trach placement 11/11/12 and patient started on vent wean. VF treated with amiodarone with question of life vest past discharge. He was extubated to [facility] 06/17 . Patient has difficulty following commands with ataxic movements and posterior lean with difficulty advancing feet. MBS done 06/18 and patient started on D3, nectar liquids.   6/20 decannulated trach. Dr. Graciela Husbands had discussion with pt's wife 6/24. Lifevest will be deferred. At some point may reconsider this ( and DNR status) if his MS improves. Agitation improved, with impulsive behavior. Sitter has been in room due to impulsivity. Patient has fallen in room in unit 2600.  Past Medical History  History reviewed. No pertinent past medical history.  Family History  family history includes Heart disease in his father.  Prior Rehab/Hospitalizations: Had short amount of rehab after back surgery 20 yrs ago.   Current Medications  Current facility-administered medications:acetaminophen (TYLENOL) tablet 650 mg, 650 mg, Per NG tube, Q4H PRN, Alyson Reedy, MD, 650 mg at 11/13/12 0945;  aspirin chewable tablet 81 mg, 81 mg, Oral, Daily, Merwyn Katos, MD, 81 mg at 11/25/12 0856;  atorvastatin (LIPITOR) tablet 80 mg,  80 mg, Oral, q1800, Merwyn Katos, MD, 80 mg at 11/24/12 1716 carvedilol (COREG) tablet 9.375 mg, 9.375 mg, Oral, BID WC, Pricilla Riffle, MD, 9.375 mg at 11/25/12 0855;  clopidogrel (PLAVIX) tablet 75 mg, 75 mg, Oral, Q breakfast, Merwyn Katos, MD, 75 mg at 11/25/12 0855;  enalapril (VASOTEC) tablet 2.5 mg, 2.5 mg, Oral, BID, Pricilla Riffle, MD, 2.5 mg at 11/25/12 0854;  enoxaparin (LOVENOX) injection 40 mg, 40 mg, Subcutaneous, Daily, Oretha Milch, MD, 40 mg at 11/25/12 0854 feeding supplement (ENSURE COMPLETE) liquid 237 mL, 237 mL, Oral, TID BM, Ashley Jacobs, RD, 237 mL at 11/25/12 0853;  fentaNYL (SUBLIMAZE) injection 25 mcg, 25 mcg,  Intravenous, Q2H PRN, Merwyn Katos, MD, 25 mcg at 11/24/12 0444;  levalbuterol (XOPENEX) nebulizer solution 0.63 mg, 0.63 mg, Nebulization, Q3H PRN, Merwyn Katos, MD phenytoin (DILANTIN) 125 MG/5ML suspension 200 mg, 200 mg, Oral, BID, Merwyn Katos, MD, 200 mg at 11/25/12 0856;  QUEtiapine (SEROQUEL) tablet 50 mg, 50 mg, Oral, QHS, Merwyn Katos, MD, 50 mg at 11/24/12 2150;  RESOURCE THICKENUP CLEAR, , Oral, PRN, Oretha Milch, MD;  spironolactone (ALDACTONE) tablet 12.5 mg, 12.5 mg, Oral, Daily, Dolores Patty, MD, 12.5 mg at 11/25/12 4132 valproic acid (DEPAKENE) 250 MG capsule 500 mg, 500 mg, Oral, BID, Merwyn Katos, MD, 500 mg at 11/25/12 4401  Patients Current Diet: Dysphagia III with thin liquids  Precautions / Restrictions Precautions Precautions: Fall Precaution Comments: pt with noted visual deficits to the Right this date. Restrictions Weight Bearing Restrictions: No   Prior Activity Level Community (5-7x/wk): Went out daily. (Enjoys wood working.)  Journalist, newspaper / Corporate investment banker Devices/Equipment: None Home Adaptive Equipment: Shower chair with back  Prior Functional Level Prior Function Level of Independence: Independent Able to Take Stairs?: Yes Driving: Yes Vocation: Full time employment Comments: Patient not able to communicate PLOF  Current Functional Level Cognition  Arousal/Alertness: Awake/alert Overall Cognitive Status: Impaired/Different from baseline Current Attention Level: Selective Memory:  (improved from yesterday) Orientation Level: Other (comment) (forgetful and confused at times) Following Commands: Follows one step commands with increased time Safety/Judgement: Decreased awareness of safety;Decreased awareness of deficits General Comments: pt much improved from yesterday however still remains to have impaired comprehension, delayed processing Attention: Focused Focused Attention: Impaired Focused Attention  Impairment: Verbal basic Memory:  (unable to assess due to lethargy) Awareness: Impaired Awareness Impairment: Intellectual impairment;Emergent impairment;Anticipatory impairment Problem Solving: Impaired Problem Solving Impairment: Functional basic Behaviors: Restless Safety/Judgment: Impaired    Extremity Assessment (includes Sensation/Coordination)  RUE ROM/Strength/Tone: Deficits RUE ROM/Strength/Tone Deficits: generalized weakness RUE Sensation:  (unable to accurately assess) RUE Coordination: Deficits RUE Coordination Deficits: due to apparent weakness  RLE ROM/Strength/Tone: Unable to fully assess;Due to impaired cognition    ADLs  Eating/Feeding: Other (comment) Where Assessed - Eating/Feeding:  (not assessed) Grooming: Performed;Wash/dry face;Supervision/safety;Set up (with increased time and verbal/gestural cues) Where Assessed - Grooming: Supported standing Upper Body Bathing: Maximal assistance Where Assessed - Upper Body Bathing: Supported standing Lower Body Bathing: Maximal assistance Where Assessed - Lower Body Bathing: Supported sit to stand Upper Body Dressing: Maximal assistance Where Assessed - Upper Body Dressing: Supported sitting Lower Body Dressing: Maximal assistance Where Assessed - Lower Body Dressing: Supported sit to Pharmacist, hospital: Performed (see ADL comments below) Toilet Transfer: Patient Percentage: 70% Statistician Method: Sit to Barista: Regular height toilet;Grab bars Toileting - Clothing Manipulation and Hygiene: Performed;+1 Total assistance Where Assessed -  Toileting Clothing Manipulation and Hygiene: Standing Tub/Shower Transfer: Maximal assistance Equipment Used: Gait belt Transfers/Ambulation Related to ADLs: Min A +1 (HHA) ADL Comments: Focus of session today was to look at vision and scanning. Walter Frazier was able to say his numbers 1-9 then I turned a piece of paper over that had the numbers 1-9 scattered  around on it and had him then find the numbers in sequence it took him and 10 seconds with mod VCs to find them in order. Had him work on tracing a pattern from point "A" to point "B"--this was quite difficult for him.  Had him look at a sheet of paper and  find the ones the letters that were underlined, this was difficult so I highlighted the ones that were underlined and he had increased success with finding and naming the letters  (however his scanning pattern did not have a pattern).  He stayed on task with me for 30 minutes and did not show distraction when two people opened the door on the other side of the curtain (only when they stepped around the curtain did he look up). Left some of tasks that we had worked on for his wife to work on with him as well as giving her other ideas with scanning the newspaper.  Pt making progress. Still feel like there are visual issues more related to processing and scanning that should get better with continued therapy.    Mobility  Bed Mobility: Supine to Sit Rolling Right: 1: +2 Total assist Rolling Right: Patient Percentage: 70% Right Sidelying to Sit: 1: +2 Total assist Right Sidelying to Sit: Patient Percentage: 40% Supine to Sit: 5: Supervision;HOB flat Sitting - Scoot to Edge of Bed: 4: Min assist    Transfers  Transfers: Sit to Stand;Stand to Sit Sit to Stand: 4: Min assist;With upper extremity assist;From bed Sit to Stand: Patient Percentage: 50% Stand to Sit: 4: Min assist;With upper extremity assist;To bed Stand to Sit: Patient Percentage: 40% Stand Pivot Transfers: 1: +2 Total assist Stand Pivot Transfers: Patient Percentage: 60%    Ambulation / Gait / Stairs / Psychologist, prison and probation services  Ambulation/Gait Ambulation/Gait Assistance: 4: Min assist Ambulation/Gait: Patient Percentage: 60% Ambulation Distance (Feet): 200 Feet Assistive device: 1 person hand held assist Ambulation/Gait Assistance Details: pt able to follow directional cues  majority of the time, occasional LOB requiring minA Gait Pattern: Step-through pattern Gait velocity: slow Stairs: No Wheelchair Mobility Wheelchair Mobility: No 11/25/12   Posture / Balance Static Standing Balance Static Standing - Balance Support: During functional activity Static Standing - Level of Assistance: 2: Max assist Static Standing - Comment/# of Minutes: during walking. pt with posterior lean, howver, improved midline positioning with facilitatiaon    Special needs/care consideration BiPAP/CPAP No CPM No Continuous Drip IV No Dialysis No         Life Vest not at this time/DNR also Oxygen room air Special Bed  Trach Size decannulated 11/21/12 Wound Vac (area) No     Skin Wife mentioned spots on back not there PTA                         Bowel mgmt: Incontinent of bowel 11/20/12 Bladder mgmt:  Has foley catheter in place Diabetic mgmt No    Previous Home Environment Living Arrangements: Spouse/significant other;Other relatives (mother in law) Lives With: Spouse;Family;Other (Comment) (wife takes care of her mother) Available Help at Discharge: Family;Available 24 hours/day Type of Home: House Home Layout: One  level Home Access: Stairs to enter Entrance Stairs-Rails: None Entrance Stairs-Number of Steps: 3 Bathroom Shower/Tub: Engineer, manufacturing systems: Yes How Accessible: Accessible via walker Home Care Services: No Additional Comments: Pt is a poor historian  Discharge Living Setting Plans for Discharge Living Setting: Patient's home;House;Lives with (comment) (Lives with wife.) Type of Home at Discharge: House Discharge Home Layout: One level Discharge Home Access: Stairs to enter Entrance Stairs-Number of Steps: 3 Do you have any problems obtaining your medications?: No  Social/Family/Support Systems Patient Roles: Spouse (Lives with wife.  Wife can assist and is not working.) Contact Information: Walter Frazier -  wife Anticipated Caregiver: Wife - Darl Pikes Anticipated Caregiver's Contact Information: Darl Pikes - [phone] Ability/Limitations of Caregiver: Wife can assist.  Wife not working. Caregiver Availability: 24/7 Discharge Plan Discussed with Primary Caregiver: Yes Is Caregiver In Agreement with Plan?: Yes Does Caregiver/Family have Issues with Lodging/Transportation while Pt is in Rehab?: No  Goals/Additional Needs Patient/Family Goal for Rehab: PT/OT/ST S/Min A goals Expected length of stay: 3 weeks Cultural Considerations: None Dietary Needs: Dys 3, nectar thick liquids Equipment Needs: TBD Pt/Family Agrees to Admission and willing to participate: Yes (Spoke with wife and to patient.) Program Orientation Provided & Reviewed with Pt/Caregiver Including Roles  & Responsibilities: Yes   Decrease burden of Care through IP rehab admission:  Not applicable  Possible need for SNF placement upon discharge: Not likely  Patient Condition: This patient's medical and functional status has changed since the consult dated: 11/19/12 in which the Rehabilitation Physician determined and documented that the patient's condition is appropriate for intensive rehabilitative care in an inpatient rehabilitation facility. See "History of Present Illness" (above) for medical update. Functional changes [id number] min to mod assist with verbal and tactile cues for cognition issues.. Patient's medical and functional status update has been discussed with the Rehabilitation physician and patient remains appropriate for inpatient rehabilitation. Will admit to inpatient rehab today.  Preadmission Screen Completed By:  Ottie Glazier, RN, 11/25/12 1449. ______________________________________________________________________   Discussed status with Dr. Riley Kill  On 11/25/12 at 1548 and received telephone approval for admission today.  Admission Coordinator: Ottie Glazier, RN, time 2956 Date 11/25/12.

## 2012-11-21 NOTE — Progress Notes (Signed)
Rehab admissions - Please see note from Dr. Eden Emms regarding possible need for life vest.  I have asked case manager to follow up regarding life vest need.  I am awaiting BCBS decision regarding possible inpatient rehab admission.  Call me for questions.  #[phone]

## 2012-11-21 NOTE — Progress Notes (Signed)
Speech Language Pathology Treatment Patient Details Name: Walter Frazier MRN: [id number] DOB: [birth year] Today's Date: 11/21/2012 Time: 1200-1221 SLP Time Calculation (min): 21 min  Assessment / Plan / Recommendation Clinical Impression  Pt. seen for cognitive therapy with sitter present.  He was decannulated this morning with anticipated decreased vocal quality and intermittent dysphonia.  He followed one step commands with body parts with max-total cues needed due to apraxic limb movements.  Pt. sustained attention to activity with SLP (looking at his family photos) for 5 minutes with supervision cues.  He required max verbal/visual/tactile cues for functional problem solving.  Walter Frazier needed moderate verbal cues for temporal, spatial and situational orientation.  Continue ST.  Orders for dysphagia therapy were discontinued and will need to receive new orders as pt. had MBS 6/18 and is on modified diet and liquids.       SLP Plan  Continue with current plan of care    Pertinent Vitals/Pain none  SLP Goals  SLP Goals Potential to Achieve Goals: Good SLP Goal #1: Pt. will reattempt PMSV once trach changed to #6. SLP Goal #1 - Progress: Discontinued (comment) SLP Goal #2: Pt. will follow 1 step commands with 79% accuracy without cues. SLP Goal #2 - Progress: Progressing toward goal SLP Goal #3: Pt. will demonstrate functional problem solving with moderate verbal/visual cues. SLP Goal #3 - Progress: Progressing toward goal SLP Goal #4: Pt. will wear PMSV 2-4 hours with all VSS. SLP Goal #4 - Progress: Discontinued (comment) SLP Goal #5: Pt. will increase volume/intensity utilizing appropriate breath support for speech with min. cues. SLP Goal #5 - Progress: Discontinued (comment)  General Temperature Spikes Noted: No Respiratory Status: Room air (decannulated) Behavior/Cognition: Alert;Cooperative;Pleasant mood;Confused;Requires cueing;Distractible Oral Cavity - Dentition: Adequate  natural dentition Patient Positioning: Upright in bed  Oral Cavity - Oral Hygiene Does patient have any of the following "at risk" factors?: Diet - patient on thickened liquids;Other - dysphagia;Lips - dry, cracked Brush patient's teeth BID with toothbrush (using toothpaste with fluoride): Yes Patient is AT RISK - Oral Care Protocol followed (see row info): Yes   Treatment Treatment focused on: Cognition Skilled Treatment: max verbal cueing   GO     Breck Coons Winthrop M.Ed [employer] [phone]  11/21/2012

## 2012-11-21 NOTE — Progress Notes (Signed)
Pt is resting comfortably at this time. No distress noted. Pt's stoma is covered. Dressing is clean and dry

## 2012-11-21 NOTE — Progress Notes (Signed)
PULMONARY  / CRITICAL CARE MEDICINE  Name: Walter Frazier MRN: [id number] DOB: 16-Mar-1956    ADMISSION DATE:  10/30/2012 CONSULTATION DATE:  10/30/2012  REFERRING MD :  Diona Browner PRIMARY SERVICE: PCCM  CHIEF COMPLAINT:  Cardiac arrest  BRIEF PATIENT DESCRIPTION: 57 y/o male smoker with no known past medical history was admitted to [facility] on 5/29 after cardiac arrest from a STEMI.  He was comatose afterwards and was started on the hypothermia protocol and transferred to [facility].  SIGNIFICANT EVENTS / STUDIES:  5/29 - LHC > DES to prox LAD @ [facility]. IABP placed 5/29 - Transfer to [facility] for hypothermia 5/30 - Echo >> EF 25%, LV severely dilated 6/02 - RN noted ?myoclonus - sedation infusions resumed. Purposeful on WUA but not F/C.  6/02 - Neuro consult: EEG > non-convulsive status epilepticus. Resolution of these discharges after ativan. Loaded with fosphenytoin.  6/02 - CT head: NAD 6/03 - 24 hr EEG:  Diffuse slowing, suggestive of general encephalopathy 6/04 - Started on Keppra 6/06 - daily progress neuro wise, severe agiation 6/07 - weaned for 1 hour on PSV, agitation limiting factor 6/09 Passed SBT, extubated, tolerated approx 3 hrs. Very agitated, tachypneic despite dexmedetomidine. Reintubated. Trach planned 6/10 Trach tube placed 6/11 Foley removed. Urinary retention. Foley replaced. Urethral discharge noted. Cloudy urine. Ceftrx started for UTI 6/13 Transfer to SDU 6/14 Following commands!!! Fent patch, clonazepam doses decreased 6/17  G tube  Deferred 6/18 Trach changed to #6 cuffless Shiley 6/18 DIII diet initiated 6/20 decannulated. Ready for Rehab   LINES / TUBES: IABP 5/29 >> 6/01 ETT 5/29 >> 6/09, 6/09 >> 6/10 L IJ CVL 5/29 >> 6/10 L radial A-line 5/29 >> 6/4 Trach 6/10 >> 6/20 PICC 6/10 >> 6/20   CULTURES: MRSA PCR 5/29 >> neg Urine 6/12 >> E coli  ANTIBIOTICS: Ceftrx 6/11 >> 6/14  SUBJ/Overnight: Poorly oriented. Answers questions. No distress. No new  complaints. Tolerating diet. Sitter @ bedside  PHYSICAL EXAMINATION: Gen: NAD HEENT: Poor dentition, o/w WNL PULM: clear CV: RRR s M AB: BS+, soft, nontender, no hsm Ext: warm/dry, no edema Neuro: moves all ext with full strength, + F/C  LABS: BMET    Component Value Date/Time   NA 138 11/20/2012 0550   K 3.8 11/20/2012 0550   CL 103 11/20/2012 0550   CO2 25 11/20/2012 0550   GLUCOSE 107* 11/20/2012 0550   BUN 14 11/20/2012 0550   CREATININE 0.62 11/20/2012 0550   CALCIUM 9.0 11/20/2012 0550   GFRNONAA >90 11/20/2012 0550   GFRAA >90 11/20/2012 0550     CBC    Component Value Date/Time   WBC 10.8* 11/17/2012 0500   RBC 4.04* 11/17/2012 0500   HGB 12.6* 11/17/2012 0500   HCT 36.4* 11/17/2012 0500   PLT 629* 11/17/2012 0500   MCV 90.1 11/17/2012 0500   MCH 31.2 11/17/2012 0500   MCHC 34.6 11/17/2012 0500   RDW 12.7 11/17/2012 0500   LYMPHSABS 2.8 11/17/2012 0500   MONOABS 1.0 11/17/2012 0500   EOSABS 0.5 11/17/2012 0500   BASOSABS 0.1 11/17/2012 0500     ABG    Component Value Date/Time   PHART 7.437 11/11/2012 0430   PCO2ART 41.1 11/11/2012 0430   PO2ART 90.0 11/11/2012 0430   HCO3 27.7* 11/11/2012 0430   TCO2 29 11/11/2012 0430   ACIDBASEDEF 0.6 11/03/2012 0645   O2SAT 97.0 11/11/2012 0430     Recent Labs Lab 11/20/12 0108 11/20/12 0744 11/20/12 1658 11/21/12 0003 11/21/12 0844  GLUCAP  132* 122* 129* 141* 116*    CXR: no new film  ASSESSMENT / PLAN:  CARDIOVASCULAR A:  Cardiac arrest 5/29 - VT/VF Anterior wall STEMI 5/29, prox LAD occlusion - s/p DES PCI @ [facility] Cardiogenic shock, resolved.   HTN  P:  -continue enalapril, ASA, metoprolol  -Cards following. Rec life vest  PULMONARY A:  Acute respiratory failure post cardiac arrest LLL Airspace Disease - atelectasis, resolved Failed extubation 6/09 Tracheostomy status P:   -Decannulated 6/20. Monitor resp status   NEUROLOGIC A:   Post anoxic encephalopathy - much improved Status epilepticus,  resolved Severe agitation - improved Apparent aphasia, resolved   P:   -I have asked Neuro to re-eval and clarify his anti-convulsants   Does he still require 2 ACDs? -Cont low dose PRN fentanyl -D/C scheduled clonazepam - started 6/11, reduced 6/19, stopped 6/20 -Cont quetiapine 6/12 - reduced 6/13, reduced 6/20  RENAL A: Hyponatremia, resolved Hypokalemia, resolved P:   -Correct electrolytes as indicated  GASTROINTESTINAL A:  Tolerating DIII diet P:   Cont same   HEMATOLOGIC A:   DVT prevention P:  - cont LMWH    INFECTIOUS E coli UTI, resolved P:   -micro and abx as above   Ready for CIR any time. Cards to address issue of life vest prior to transfer  Billy Fischer, MD ; [facility] [phone].  After 5:30 PM or weekends, call [phone]

## 2012-11-21 NOTE — Progress Notes (Signed)
Patient ID: Walter Frazier, male   DOB: May 15, 1956, 57 y.o.   MRN: [id number]   SUBJECTIVE:  Alert but not communicative   PHYSICAL EXAM Filed Vitals:   11/20/12 2333 11/21/12 0000 11/21/12 0400 11/21/12 0508  BP:  110/74 93/57 93/57   Pulse: 93 94 94 97  Temp:  99 F (37.2 C) 98.2 F (36.8 C)   TempSrc:  Oral Oral   Resp: 26 26 18 26   Height:      Weight:      SpO2: 96% 95% 97% 97%   General:  No acute distress Tracheiostomy Lungs:  Decreased breath sounds Heart:  RRR Abdomen:  Positive bowel sounds, no rebound no guarding  Large echymosis in RLQ Extremities:  No edema  LABS:  Results for orders placed during the hospital encounter of 10/30/12 (from the past 24 hour(s))  GLUCOSE, CAPILLARY     Status: Abnormal   Collection Time    11/20/12  4:58 PM      Result Value Range   Glucose-Capillary 129 (*) 70 - 99 mg/dL  GLUCOSE, CAPILLARY     Status: Abnormal   Collection Time    11/21/12 12:03 AM      Result Value Range   Glucose-Capillary 141 (*) 70 - 99 mg/dL    Intake/Output Summary (Last 24 hours) at 11/21/12 0802 Last data filed at 11/21/12 0536  Gross per 24 hour  Intake    140 ml  Output    601 ml  Net   -461 ml    ASSESSMENT AND PLAN:  CAD: S/P anterior MI with intervention and resultant anoxic brain injury  ISCHEMIC CARDIOMYOPATHY:  He seems to be euvolemic.  No room to titrate his meds at this point.   Discussed with Dr Graciela Husbands  Call Corinda Gubler EP before transfer to rehab or non telemetry bed to consider life vest. Arrest in setting of MI so immediate AICD not Indicated.    Will sign off  Charlton Haws 11/21/2012 8:02 AM

## 2012-11-21 NOTE — Progress Notes (Signed)
**Note De-Identified  Obfuscation** RT note: Trach removed per MD.  Patient tolerated well. SAT 97% on RA, BBS clr, strong cough with minimal secretions.  RT to cont. To monitor

## 2012-11-21 NOTE — Progress Notes (Signed)
NUTRITION FOLLOW UP  Intervention:   1. Enteral nutrition; PEG has been deferred as pt was approved for PO diet.  Recommend low threshold for resuming EN in the event PO diet is inadequate.  RD to follow ongoing interventions.  Will assess for need of supplements as pt demonstrates tolerance of diet.  2.  Supplements; Ensure Complete po TID, each supplement provides 350 kcal and 13 grams of protein.   Nutrition Dx:   Inadequate oral intake related to inability to eat as evidenced by NPO diet. Ongoing   Goal:   Meet >/=90% estimated nutrition needs. Not met, NGT removed, to resume PO diet per SLP  Monitor:   Vent status, weight trends, labs, PO intake, adequacy   Assessment:   Pt admitted with cardiac arrest.  Pt sitting in bed with PMSV at time of visit.    Pt reports that he is enjoying eating.  He reports consuming 25% of his breakfast today.  He was not able to complete meal due to fatigue.   Acknowledged that eating and feeding is a significant task for pt and may cause exhaustion.  Discussed ways to maximize intake and encouraged snacks. Will order Ensure Complete TID for pt to be given between meals or with medications.  Possible CIR candidate.     RD to follow for nutritional adequacy.  Note wt change since admission of 12 lbs. Will follow wt trends as pt is at risk for malnutrition.  Pt's PO intake is inadequate due to fatigue and increased participation in therapy leading to fatigue.  Will continue to monitor and provide interventions as needed.  Continue with low threshold to resume TFs if pt unable to improve intake or wt loss continues.  Note possible discharge to CIR.  Height: Ht Readings from Last 1 Encounters:  10/30/12 5\' 9"  (1.753 m)    Weight Status:   Wt Readings from Last 1 Encounters:  11/18/12 156 lb 8.4 oz (71 kg)    Re-estimated needs:  Kcal: 1610-9604 Protein: 85-99 gm  Fluid: >/= 1.8 L   Skin: intact   Diet Order: Dysphagia   Intake/Output  Summary (Last 24 hours) at 11/21/12 1044 Last data filed at 11/21/12 0536  Gross per 24 hour  Intake    140 ml  Output    601 ml  Net   -461 ml    Last BM: 6/19   Labs:   Recent Labs Lab 11/17/12 0500 11/20/12 0550  NA 141 138  K 3.9 3.8  CL 107 103  CO2 26 25  BUN 21 14  CREATININE 0.60 0.62  CALCIUM 9.0 9.0  GLUCOSE 128* 107*    CBG (last 3)   Recent Labs  11/20/12 1658 11/21/12 0003 11/21/12 0844  GLUCAP 129* 141* 116*    Scheduled Meds: . antiseptic oral rinse  15 mL Mouth Rinse QID  . aspirin  81 mg Oral Daily  . atorvastatin  80 mg Oral q1800  . chlorhexidine  15 mL Mouth Rinse BID  . [START ON 11/22/2012] clopidogrel  75 mg Oral Q breakfast  . enalapril  5 mg Oral BID  . enoxaparin (LOVENOX) injection  40 mg Subcutaneous Daily  . metoprolol tartrate  25 mg Oral BID  . phenytoin  200 mg Oral BID  . QUEtiapine  50 mg Oral QHS  . valproic acid  500 mg Oral BID    Continuous Infusions:    Loyce Dys, MS RD LDN Clinical Inpatient Dietitian Pager: [phone] Weekend/After hours pager:  [phone]      

## 2012-11-21 NOTE — Progress Notes (Signed)
Query re lifevest.  Pt is DNR and at this point not the right thing as most appropriate is to consider    llife vest as a bridge to more definitive therapy  Agree with Dr [facility] that at this juncture the decision is best deferred

## 2012-11-21 NOTE — Progress Notes (Addendum)
NEURO HOSPITALIST PROGRESS NOTE   SUBJECTIVE:                                                                                                                        No complaints, sitting up eating with assistance.  No further seizure activity and tolerating Depakote and Dilantin well.   OBJECTIVE:                                                                                                                           Vital signs in last 24 hours: Temp:  [97.2 F (36.2 C)-99 F (37.2 C)] 97.7 F (36.5 C) (06/20 1200) Pulse Rate:  [85-112] 85 (06/20 1200) Resp:  [18-33] 21 (06/20 1200) BP: (93-128)/(42-85) 124/85 mmHg (06/20 1200) SpO2:  [95 %-99 %] 95 % (06/20 1200) FiO2 (%):  [21 %] 21 % (06/20 0805)  Intake/Output from previous day: 06/19 0701 - 06/20 0700 In: 140 [P.O.:120; I.V.:20] Out: 601 [Urine:600; Stool:1] Intake/Output this shift: Total I/O In: 120 [P.O.:120] Out: 140 [Urine:140] Nutritional status: Dysphagia  History reviewed. No pertinent past medical history.    Neurologic Exam:  Mental Status: Alert, oriented to [place], believed it was May and could not tell me the year. During conversation patient is slow to respond to some questions and will need visual ques at times.  He is able to answer complex questions such as his location, is able to tell me he gets confused at the month but will have some difficulty showing me his thumb when asked.  Cranial Nerves: II: Visual fields grossly normal, pupils equal, round, reactive to light and accommodation III,IV, VI: ptosis not present, extra-ocular motions intact bilaterally V,VII: smile symmetric, facial light touch sensation normal bilaterally VIII: hearing normal bilaterally IX,X: gag reflex present XI: bilateral shoulder shrug XII: midline tongue extension Motor: Right : Upper extremity   5/5    Left:     Upper extremity   5/5  Lower extremity   5/5     Lower extremity    5/5 Tone and bulk:normal tone throughout; no atrophy noted Sensory: Pinprick and light touch intact throughout, bilaterally Deep Tendon Reflexes:  Right: Upper Extremity   Left: Upper extremity   biceps (C-5 to C-6)  2/4   biceps (C-5 to C-6) 2/4 tricep (C7) 2/4    triceps (C7) 2/4 Brachioradialis (C6) 2/4  Brachioradialis (C6) 2/4  Lower Extremity Lower Extremity  quadriceps (L-2 to L-4) 2/4   quadriceps (L-2 to L-4) 2/4 Achilles (S1) 2/4   Achilles (S1) 2/4  --BRISK trhoughout  Plantars: Right: downgoing   Left: downgoing Cerebellar: normal finger-to-nose,  normal heel-to-shin test CV: pulses palpable throughout    Lab Results: No results found for this basename: cbc, bmp, coags, chol, tri, ldl, hga1c   Lipid Panel No results found for this basename: CHOL, TRIG, HDL, CHOLHDL, VLDL, LDLCALC,  in the last 72 hours  Studies/Results: Dg Swallowing Func-speech Pathology  11/19/2012   Walter Frazier, CCC-SLP     11/19/2012  2:59 PM Objective Swallowing Evaluation: Modified Barium Swallowing Study   Patient Details  Name: Walter Frazier MRN: [id number] Date of Birth: Dec 28, 1955  Today's Date: 11/19/2012 Time: 4098-1191 SLP Time Calculation (min): 32 min  Past Medical History: History reviewed. No pertinent past medical  history. Past Surgical History: History reviewed. No pertinent past  surgical history. HPI:  Walter Frazier is an 57 y.o. male with recent Cardiac arrest  and MI. Resp failure s/p tracheostomy, making slow progress.  Currently tolerating TF via NGT, but IR is requested to place  perc G-tube for expected prolonged dysphagia and PCM during  recovery.     Assessment / Plan / Recommendation Clinical Impression  Dysphagia Diagnosis: Mild oral phase dysphagia;Moderate  pharyngeal phase dysphagia Clinical impression: Pt. presents with a mild-moderate  oropharyngeal motor dysphagia, as evidenced by decreased bolus  formation and manipulation, decreased hyoid excursion and   laryngeal elevation, resulting in trace aspiration of thin  liquids, which pt. sensed and coughed aspirate from airway; (1  time penetration of nectar on first swallow) , and difficulty  chewing and manipulating solids in a timely manner.  Pt.'s  cognitive status placed him at a higher risk for aspiration, as  pt. is unable to follow compensatory strategies and is extremely  impulsive at this time.    Treatment Recommendation  Therapy as outlined in treatment plan below    Diet Recommendation Dysphagia 3 (Mechanical Soft);Nectar-thick  liquid (Chopped meats)   Liquid Administration via: Cup;No straw Medication Administration: Whole meds with puree Supervision: Patient able to self feed;Full supervision/cueing  for compensatory strategies Compensations: Slow rate;Small sips/bites (Place PMSV for all  po's) Postural Changes and/or Swallow Maneuvers: Seated upright 90  degrees    Other  Recommendations Oral Care Recommendations: Oral care  QID;Staff/trained caregiver to provide oral care Other Recommendations: Place PMSV during PO intake;Clarify  dietary restrictions;Have oral suction available;Order thickener  from pharmacy;Prohibited food (jello, ice cream, thin soups)   Follow Up Recommendations  Inpatient Rehab    Frequency and Duration min 2x/week  2 weeks   Pertinent Vitals/Pain n/a    SLP Swallow Goals Patient will consume recommended diet without observed clinical  signs of aspiration with: Minimal assistance;Supervision/safety Patient will utilize recommended strategies during swallow to  increase swallowing safety with: Minimal  assistance;Supervision/safety   General HPI: Walter Frazier is an 57 y.o. male with recent  Cardiac arrest and MI. Resp failure s/p tracheostomy, making slow  progress. Currently tolerating TF via NGT, but IR is requested to  place perc G-tube for expected prolonged dysphagia and PCM during  recovery. Type of Study: Modified Barium Swallowing Study Reason for Referral: Objectively  evaluate swallowing function Previous Swallow Assessment: BSE 11/17/12, Cont. NPO  Diet Prior to this Study: NPO Temperature Spikes Noted: No Respiratory Status: Trach Trach Size and Type: #6;With PMSV in place History of Recent Intubation: Yes Length of Intubations (days): 12 days Date extubated: 11/10/12 Behavior/Cognition: Alert;Cooperative;Pleasant  mood;Distractible;Requires cueing;Decreased sustained attention Oral Cavity - Dentition: Adequate natural dentition Oral Motor / Sensory Function: Within functional limits Self-Feeding Abilities: Able to feed self;Needs assist  (Impulsive) Patient Positioning: Upright in chair Baseline Vocal Quality: Low vocal intensity Volitional Cough: Strong Volitional Swallow: Unable to elicit Anatomy:  (Trach in place) Pharyngeal Secretions: Not observed secondary MBS (Coughing  thick, tan, secretions (malordorous))    Reason for Referral Objectively evaluate swallowing function   Oral Phase Oral Preparation/Oral Phase Oral Phase: Impaired Oral - Solids Oral - Puree: Impaired mastication;Weak lingual  manipulation;Piecemeal swallowing;Delayed oral transit Oral - Mechanical Soft: Impaired mastication;Weak lingual  manipulation;Piecemeal swallowing;Delayed oral transit Oral - Pill:  (Chewed pill)   Pharyngeal Phase Pharyngeal Phase Pharyngeal Phase: Impaired Pharyngeal - Nectar Pharyngeal - Nectar Teaspoon: Reduced epiglottic  inversion;Reduced anterior laryngeal mobility;Reduced laryngeal  elevation;Reduced airway/laryngeal closure;Reduced tongue base  retraction;Penetration/Aspiration during swallow (Initial swallow  only) Penetration/Aspiration details (nectar teaspoon): Material enters  airway, remains ABOVE vocal cords then ejected out (Initial  swallow only) Pharyngeal - Thin Pharyngeal - Thin Cup: Reduced epiglottic inversion;Reduced  anterior laryngeal mobility;Reduced laryngeal  elevation;Penetration/Aspiration during swallow;Trace aspiration Penetration/Aspiration details  (thin cup): Material enters  airway, passes BELOW cords then ejected out Pharyngeal Phase - Comment Pharyngeal Comment: Positive cough response with aspiration, and  aspirate was cleared trom the trachea.  Cervical Esophageal Phase    GO    Cervical Esophageal Phase Cervical Esophageal Phase: Vicente Masson T 11/19/2012, 2:59 PM     MEDICATIONS                                                                                                                        Scheduled: . antiseptic oral rinse  15 mL Mouth Rinse QID  . aspirin  81 mg Oral Daily  . atorvastatin  80 mg Oral q1800  . chlorhexidine  15 mL Mouth Rinse BID  . [START ON 11/22/2012] clopidogrel  75 mg Oral Q breakfast  . enalapril  5 mg Oral BID  . enoxaparin (LOVENOX) injection  40 mg Subcutaneous Daily  . feeding supplement  237 mL Oral TID BM  . metoprolol tartrate  25 mg Oral BID  . phenytoin  200 mg Oral BID  . QUEtiapine  50 mg Oral QHS  . 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 Initially patient was on Dilantin and Keppra but due to agitation on Keppra he was changed to Depakote and Dilantin.  Patient currently on Depakote 500 mg BID and Dilantin 200 mg BID with no further seizure activity . His cognistion is slow and has some difficulty following commands. Will obtain AED levels, MG, Phosphorus and ammonia.     Recommend: 1) Will obtain Dilantin level and Depakote level in the morning. Last Dilantin level was on 6/9 = 15 corrected 2) Would continue current AED regime while in-patient and when discharged.  If remains seizure free, would have patient follow up out patient where AED doses may be adjusted if needed.    Assessment and plan discussed with with attending physician and they are in agreement.    Felicie Morn  PA-C [facility] [phone]  11/21/2012, 1:32 PM

## 2012-11-22 MED ORDER — QUETIAPINE FUMARATE 50 MG PO TABS
50.0000 mg | ORAL_TABLET | Freq: Once | ORAL | Status: AC
  Administered 2012-11-22: 50 mg via ORAL
  Filled 2012-11-22: qty 1

## 2012-11-22 NOTE — Progress Notes (Signed)
eLink Physician-Brief Progress Note Patient Name: Walter Frazier DOB: 1955-12-16 MRN: [id number]  Date of Service  11/22/2012   HPI/Events of Note  Patient with agitation in the setting of reductions in the dosing of Seroquel.  Received one dose of haldol yesterday but has QTc of greater than 500 on.   eICU Interventions  Plan: One time additional dose of Seroquel 50 mg now   Intervention Category Major Interventions: Delirium, psychosis, severe agitation - evaluation and management  DETERDING,ELIZABETH 11/22/2012, 4:23 AM

## 2012-11-22 NOTE — Progress Notes (Signed)
Subjective: Patient awake and alert.  Inappropriate.  Magnesium, Phosphorus and Ammonia levels normal.  LFT's improving despite Depakote and Dilantin.  Dilantin level 3.5, Depakote level 20.  Patient has remained seizure free.    Objective: Current vital signs: BP 111/60  Pulse 86  Temp(Src) 98.3 F (36.8 C) (Oral)  Resp 26  Ht 5\' 9"  (1.753 m)  Wt 70 kg (154 lb 5.2 oz)  BMI 22.78 kg/m2  SpO2 97% Vital signs in last 24 hours: Temp:  [97.6 F (36.4 C)-98.5 F (36.9 C)] 98.3 F (36.8 C) (06/21 0737) Pulse Rate:  [85-108] 86 (06/21 0800) Resp:  [15-36] 26 (06/21 0800) BP: (108-133)/(60-90) 111/60 mmHg (06/21 0800) SpO2:  [94 %-100 %] 97 % (06/21 0800) Weight:  [70 kg (154 lb 5.2 oz)] 70 kg (154 lb 5.2 oz) (06/21 0609)  Intake/Output from previous day: 06/20 0701 - 06/21 0700 In: 240 [P.O.:240] Out: 640 [Urine:640] Intake/Output this shift: Total I/O In: 250 [P.O.:250] Out: -  Nutritional status: Dysphagia  Neurologic Exam: Mental Status: Alert.  Inappropriate.  Follows some commands.  Speech fluent when spontaneous.  Names.  Will not attempt to repeat.   Cranial Nerves: II: Discs flat bilaterally; Blinks to bilateral confrontation.  Pupils equal, round, reactive to light and accommodation III,IV, VI: ptosis not present, extra-ocular motions intact bilaterally V,VII: smile symmetric, facial light touch sensation normal bilaterally VIII: hearing normal bilaterally IX,X: gag reflex present XI: bilateral shoulder shrug XII: midline tongue extension Motor: Right : Upper extremity   5/5    Left:     Upper extremity   5/5  Lower extremity   5/5     Lower extremity   5/5 Tone and bulk:normal tone throughout; no atrophy noted Sensory: Pinprick and light touch intact throughout, bilaterally Deep Tendon Reflexes: 2+ and symmetric throughout Plantars: Right: downgoing   Left: downgoing  Lab Results: Basic Metabolic Panel:  Recent Labs Lab 11/17/12 0500 11/20/12 0550  11/21/12 1300 11/21/12 1655  NA 141 138 141  --   K 3.9 3.8 3.9  --   CL 107 103 105  --   CO2 26 25 26   --   GLUCOSE 128* 107* 141*  --   BUN 21 14 14   --   CREATININE 0.60 0.62 0.73  --   CALCIUM 9.0 9.0 8.8  --   MG  --   --   --  2.2  PHOS  --   --   --  3.1    Liver Function Tests:  Recent Labs Lab 11/21/12 1300  AST 41*  ALT 58*  ALKPHOS 224*  BILITOT 0.2*  PROT 7.8  ALBUMIN 2.7*   No results found for this basename: LIPASE, AMYLASE,  in the last 168 hours  Recent Labs Lab 11/21/12 1655  AMMONIA 42    CBC:  Recent Labs Lab 11/17/12 0500  WBC 10.8*  NEUTROABS 6.4  HGB 12.6*  HCT 36.4*  MCV 90.1  PLT 629*    Cardiac Enzymes: No results found for this basename: CKTOTAL, CKMB, CKMBINDEX, TROPONINI,  in the last 168 hours  Lipid Panel: No results found for this basename: CHOL, TRIG, HDL, CHOLHDL, VLDL, LDLCALC,  in the last 168 hours  CBG:  Recent Labs Lab 11/20/12 1658 11/21/12 0003 11/21/12 0844 11/21/12 1543 11/21/12 2109  GLUCAP 129* 141* 116* 130* 152*    Microbiology: Results for orders placed during the hospital encounter of 10/30/12  MRSA PCR SCREENING     Status: None   Collection  Time    10/30/12 11:14 PM      Result Value Range Status   MRSA by PCR NEGATIVE  NEGATIVE Final   Comment:            The GeneXpert MRSA Assay (FDA     approved for NASAL specimens     only), is one component of a     comprehensive MRSA colonization     surveillance program. It is not     intended to diagnose MRSA     infection nor to guide or     monitor treatment for     MRSA infections.  URINE CULTURE     Status: None   Collection Time    11/12/12  6:47 PM      Result Value Range Status   Specimen Description URINE, CATHETERIZED   Final   Special Requests NONE   Final   Culture  Setup Time 11/13/2012 04:58   Final   Colony Count >=100,000 COLONIES/ML   Final   Culture ESCHERICHIA COLI   Final   Report Status 11/14/2012 FINAL   Final    Organism ID, Bacteria ESCHERICHIA COLI   Final    Coagulation Studies: No results found for this basename: LABPROT, INR,  in the last 72 hours  Imaging: No results found.  Medications:  I have reviewed the patient's current medications. Scheduled: . aspirin  81 mg Oral Daily  . atorvastatin  80 mg Oral q1800  . clopidogrel  75 mg Oral Q breakfast  . enalapril  5 mg Oral BID  . enoxaparin (LOVENOX) injection  40 mg Subcutaneous Daily  . feeding supplement  237 mL Oral TID BM  . metoprolol tartrate  25 mg Oral BID  . phenytoin  200 mg Oral BID  . QUEtiapine  50 mg Oral QHS  . valproic acid  500 mg Oral BID    Assessment/Plan: Patient seizure free on current AED regimen.  Levels subtherapeutic but would not attempt to optimize at this point with patient seizure free.  Not affecting liver function.  Recommendations: 1.  Continue current AED's at current doses.  Would continue for at least 6 months with plans for possible tapering to monotherapy at that time as an outpatient.     LOS: 23 days   Thana Farr, MD [facility] [phone] 11/22/2012  9:52 AM

## 2012-11-22 NOTE — Progress Notes (Signed)
PULMONARY  / CRITICAL CARE MEDICINE  Name: Walter Frazier MRN: [id number] DOB: [birth year]    ADMISSION DATE:  10/30/2012 CONSULTATION DATE:  10/30/2012  REFERRING MD :  Diona Browner PRIMARY SERVICE: PCCM  CHIEF COMPLAINT:  Cardiac arrest  BRIEF PATIENT DESCRIPTION: 57 y/o male smoker with no known past medical history was admitted to [facility] on 5/29 after cardiac arrest from a STEMI.  He was comatose afterwards and was started on the hypothermia protocol and transferred to [facility].  SIGNIFICANT EVENTS / STUDIES:  5/29 - LHC > DES to prox LAD @ [facility]. IABP placed 5/29 - Transfer to [facility] for hypothermia 5/30 - Echo >> EF 25%, LV severely dilated 6/02 - RN noted ?myoclonus - sedation infusions resumed. Purposeful on WUA but not F/C.  6/02 - Neuro consult: EEG > non-convulsive status epilepticus. Resolution of these discharges after ativan. Loaded with fosphenytoin.  6/02 - CT head: NAD 6/03 - 24 hr EEG:  Diffuse slowing, suggestive of general encephalopathy 6/04 - Started on Keppra 6/06 - daily progress neuro wise, severe agiation 6/07 - weaned for 1 hour on PSV, agitation limiting factor 6/09 Passed SBT, extubated, tolerated approx 3 hrs. Very agitated, tachypneic despite dexmedetomidine. Reintubated. Trach planned 6/10 Trach tube placed 6/11 Foley removed. Urinary retention. Foley replaced. Urethral discharge noted. Cloudy urine. Ceftrx started for UTI 6/13 Transfer to SDU 6/14 Following commands!!! Fent patch, clonazepam doses decreased 6/17  G tube  Deferred 6/18 Trach changed to #6 cuffless Shiley 6/18 DIII diet initiated 6/20 decannulated. Ready for Rehab   LINES / TUBES: IABP 5/29 >> 6/01 ETT 5/29 >> 6/09, 6/09 >> 6/10 L IJ CVL 5/29 >> 6/10 L radial A-line 5/29 >> 6/4 Trach 6/10 >> 6/20 PICC 6/10 >> 6/20   CULTURES: MRSA PCR 5/29 >> neg Urine 6/12 >> E coli  ANTIBIOTICS: Ceftrx 6/11 >> 6/14  SUBJ/Overnight: Continues to have increased agitation at night, and required an  extra dose of seroquel last night.  Currently alert with no increased wob, and actually answers questions appropriately with short answers.  Not eating well per sitter.   PHYSICAL EXAMINATION: Gen: wd male, nad HEENT: Poor dentition, no nasal purulence or d/c.  Neck without LN or TMG PULM:clear to auscultation CV: RRR s M AB: BS+, soft, nontender Ext: warm/dry, no edema, no cyanosis Neuro: moves all ext with full strength, + F/C  LABS: BMET    Component Value Date/Time   NA 141 11/21/2012 1300   K 3.9 11/21/2012 1300   CL 105 11/21/2012 1300   CO2 26 11/21/2012 1300   GLUCOSE 141* 11/21/2012 1300   BUN 14 11/21/2012 1300   CREATININE 0.73 11/21/2012 1300   CALCIUM 8.8 11/21/2012 1300   GFRNONAA >90 11/21/2012 1300   GFRAA >90 11/21/2012 1300     CBC    Component Value Date/Time   WBC 10.8* 11/17/2012 0500   RBC 4.04* 11/17/2012 0500   HGB 12.6* 11/17/2012 0500   HCT 36.4* 11/17/2012 0500   PLT 629* 11/17/2012 0500   MCV 90.1 11/17/2012 0500   MCH 31.2 11/17/2012 0500   MCHC 34.6 11/17/2012 0500   RDW 12.7 11/17/2012 0500   LYMPHSABS 2.8 11/17/2012 0500   MONOABS 1.0 11/17/2012 0500   EOSABS 0.5 11/17/2012 0500   BASOSABS 0.1 11/17/2012 0500     ABG    Component Value Date/Time   PHART 7.437 11/11/2012 0430   PCO2ART 41.1 11/11/2012 0430   PO2ART 90.0 11/11/2012 0430   HCO3 27.7* 11/11/2012 0430  TCO2 29 11/11/2012 0430   ACIDBASEDEF 0.6 11/03/2012 0645   O2SAT 97.0 11/11/2012 0430     Recent Labs Lab 11/20/12 1658 11/21/12 0003 11/21/12 0844 11/21/12 1543 11/21/12 2109  GLUCAP 129* 141* 116* 130* 152*    CXR: no new film  ASSESSMENT / PLAN:  CARDIOVASCULAR A:  Cardiac arrest 5/29 - VT/VF Anterior wall STEMI 5/29, prox LAD occlusion - s/p DES PCI @ [facility] Cardiogenic shock, resolved.   HTN  P:  -continue enalapril, ASA, metoprolol  -Cards following  PULMONARY A:  Acute respiratory failure post cardiac arrest LLL Airspace Disease - atelectasis, resolved Failed  extubation 6/09 Tracheostomy status >> now decannulated Comfortable on room air with no increased wob.   NEUROLOGIC A:   Post anoxic encephalopathy - much improved Status epilepticus, resolved Severe agitation - improved Apparent aphasia, resolved   P:   -continue current seizure medications per neuro.   Ready for CIR any time

## 2012-11-22 NOTE — Progress Notes (Signed)
SUBJECTIVE:  Not very talkative  OBJECTIVE:   Vitals:   Filed Vitals:   11/22/12 0443 11/22/12 0609 11/22/12 0737 11/22/12 0800  BP: 129/83 114/73  111/60  Pulse: 105 98 102 86  Temp:  98.5 F (36.9 C) 98.3 F (36.8 C)   TempSrc:  Oral Oral   Resp: 20 20 23 26   Height:      Weight:  70 kg (154 lb 5.2 oz)    SpO2: 98% 98% 99% 97%   I&O's:   Intake/Output Summary (Last 24 hours) at 11/22/12 0941 Last data filed at 11/22/12 0915  Gross per 24 hour  Intake    370 ml  Output    540 ml  Net   -170 ml   TELEMETRY: Reviewed telemetry pt in NSR:     PHYSICAL EXAM General: Well developed, well nourished, in no acute distress Head: Eyes PERRLA, No xanthomas.   Normal cephalic and atramatic  Lungs:   Clear bilaterally to auscultation and percussion. Heart:   HRRR S1 S2 Pulses are 2+ & equal.  Abdomen: Bowel sounds are positive, abdomen soft and non-tender without masses  Extremities:   No clubbing, cyanosis or edema.  DP +1 Neuro: Alert and oriented X 3. Psych:  Good affect, responds appropriately   LABS: Basic Metabolic Panel:  Recent Labs  40/98/11 0550 11/21/12 1300 11/21/12 1655  NA 138 141  --   K 3.8 3.9  --   CL 103 105  --   CO2 25 26  --   GLUCOSE 107* 141*  --   BUN 14 14  --   CREATININE 0.62 0.73  --   CALCIUM 9.0 8.8  --   MG  --   --  2.2  PHOS  --   --  3.1   Liver Function Tests:  Recent Labs  11/21/12 1300  AST 41*  ALT 58*  ALKPHOS 224*  BILITOT 0.2*  PROT 7.8  ALBUMIN 2.7*   Coag Panel:   Lab Results  Component Value Date   INR 1.05 11/11/2012   INR 1.13 10/31/2012   INR 1.31 10/30/2012    RADIOLOGY: Dg Abd 1 View  11/17/2012   *RADIOLOGY REPORT*  Clinical Data: Oral contrast assessment prior to possible gastrostomy tube placement.  ABDOMEN - 1 VIEW  Comparison: None.  Findings: A nasogastric tube is present in the distal stomach. Underlying bowel gas pattern shows no evidence of obstruction or significant ileus.  There is no  contrast visualized in the stomach or bowel.  No abnormal calcifications are seen.  Degenerative changes are present in the lower lumbar spine.  IMPRESSION: No oral contrast is identified in the stomach or bowel.  There is no evidence of obstructive pattern or significant ileus.   Original Report Authenticated By: Irish Lack, M.D.   Ct Head Wo Contrast  11/03/2012   *RADIOLOGY REPORT*  Clinical Data: Coma. Possible anoxic brain injury. Cardiac arrest.  CT HEAD WITHOUT CONTRAST  Technique:  Contiguous axial images were obtained from the base of the skull through the vertex without contrast.  Comparison: None.  Findings: There is no evidence for acute infarction, intracranial hemorrhage, mass lesion, hydrocephalus, or extra-axial fluid. There is no atrophy or white matter disease. Gray-white junction is preserved throughout.  Calvarium is intact.  Moderate mucosal thickening in the right greater than left maxillary sinuses and bilateral ethmoid sinuses.  Negative appearing orbits.  No mastoid fluid.  IMPRESSION: No acute intracranial findings.  Definitive CT signs of anoxic  injury are not observed at this time.  Continued surveillance is warranted.   Original Report Authenticated By: Davonna Belling, M.D.   Chest Portable 1 View To Assess Tube Placement And Rule-out Pneumothorax  11/11/2012   *RADIOLOGY REPORT*  Clinical Data: Tracheostomy tube placement.  Acute respiratory failure.  Myocardial infarction.  PORTABLE CHEST - 1 VIEW  Comparison: 11/10/2012  Findings: A new tracheostomy tube is seen in appropriate position in the mid thoracic trachea.  A left internal jugular center venous catheter remains in appropriate position with tip in the mid SVC. A nasogastric tube been pulled back, with the distal tip now in the proximal thoracic esophagus. No pneumothorax identified.  There is mild atelectasis in the medial left lung base which shows no significant change.  Right lung is clear.  Heart size is within normal  limits.  IMPRESSION:  New tracheostomy tube in appropriate position.  No evidence of pneumomediastinum or pneumothorax. 2.  No significant change and left basilar atelectasis. 3.  Abnormal positioning of nasogastric tube, with tip in the proximal thoracic esophagus.   Original Report Authenticated By: Myles Rosenthal, M.D.   Dg Chest Port 1 View  11/10/2012   *RADIOLOGY REPORT*  Clinical Data: Myocardial infarction - endotracheal tube placement.  PORTABLE CHEST - 1 VIEW  Comparison: 11/10/2012 and prior chest radiographs dating back to 10/30/2012  Findings: An endotracheal tube appears in satisfactory position with tip 3.6 cm above the carina. A left IJ central venous catheters present with tip overlying the mid - upper SVC / azygos vein. An NG tube is present with tip overlying the EG junction. The cardiomediastinal silhouette is stable. Continued left lower lung atelectasis/consolidation is noted. There is no evidence of pneumothorax or pulmonary edema.  IMPRESSION: Support apparatus as described.  Recommend NG tube advancement.  Continued left lower lobe atelectasis/consolidation.   Original Report Authenticated By: Harmon Pier, M.D.   Dg Chest Port 1 View  11/10/2012   *RADIOLOGY REPORT*  Clinical Data: Evaluate endotracheal tube  PORTABLE CHEST - 1 VIEW  Comparison: 11/09/2012; 11/08/2012; 11/07/2012  Findings:  Grossly unchanged cardiac silhouette and mediastinal contours. Stable positioning of support apparatus.  No pneumothorax. Heterogeneous air space opacities within the mid and inferior aspect of the left lung are similar to remote prior examinations. No new focal airspace opacities.  No definite pleural effusion. Unchanged bones.  IMPRESSION: 1.  Stable positioning of support apparatus.  No pneumothorax. 2.  Left mid and lower lung heterogeneous air space opacities are similar to the 11/07/2012 examination and again remain concerning for infection.   Original Report Authenticated By: Tacey Ruiz, MD    Dg Chest Port 1 View  11/09/2012   *RADIOLOGY REPORT*  Clinical Data: Endotracheal tube, assess effusions and left lower lobe airspace opacity  PORTABLE CHEST - 1 VIEW  Comparison: Prior chest x-ray six in 2014  Findings: To the endotracheal tube is 4.4 cm above the carina.  The left IJ approach central venous catheter is in stable position with the tip in the mid SVC.  Nasogastric tube is coiled within the stomach.  Improving left basilar opacity with some persistent opacity in the left retrocardiac region.  Cardiac and mediastinal contours are unchanged. No definite pleural effusion on today's exam.  The right lung is relatively clear.  No acute osseous abnormality.  IMPRESSION:  1.  Improving left basilar opacity with some residual retrocardiac density which may reflect atelectasis or infiltrate. 2.  No definite effusion. 3.  Stable and satisfactory support apparatus.  Original Report Authenticated By: Malachy Moan, M.D.   Dg Chest Port 1 View  11/08/2012   *RADIOLOGY REPORT*  Clinical Data: Assess left lower lobe infiltrate  PORTABLE CHEST - 1 VIEW  Comparison: Prior chest x-rays 11/07/2012  Findings: The tip of the endotracheal tube is 4 cm above the carina.  Left IJ approach central venous catheter in unchanged position with the tip in the mid superior vena cava.  The nasogastric tube tip is coiled within the gastric fundus. Increasing left lower lobe opacity.  There may be a posteriorly layering pleural effusion.  The right lung remains clear.  No pneumothorax or acute osseous abnormality.  IMPRESSION:  1.  Increasing left lower lobe opacity.  There may be a posteriorly layering pleural effusion with associated atelectasis.  Developing infiltrate is also a possibility.  2.  Stable and satisfactory support apparatus as above.   Original Report Authenticated By: Malachy Moan, M.D.   Dg Chest Port 1 View  11/07/2012   *RADIOLOGY REPORT*  Clinical Data: Evaluate endotracheal tube  PORTABLE CHEST  - 1 VIEW  Comparison: 11/06/2012.  Findings: Endotracheal tube tip 5.9 cm above the carina.  Left central line tip mid superior vena cava level.  Nasogastric tube tip gastric fundus level.  Subsegmental atelectasis/subtle infiltrate left mid lower lung zone. Recommend follow-up until clearance.  Interval improved aeration right midlung zone.  Pulmonary vascular prominence.  Heart size top normal.  IMPRESSION: Slight improved aeration right midlung zone.  Persistent patchy changes peripheral aspect left midlung zone may represent atelectasis or infiltrate.  Recommend follow-up until clearance.   Original Report Authenticated By: Lacy Duverney, M.D.   Dg Chest Port 1 View  11/06/2012   *RADIOLOGY REPORT*  Clinical Data: ET tube placement.  PORTABLE CHEST - 1 VIEW  Comparison: 11/05/2012  Findings: Support devices remain in stable position.  Endotracheal tube remains approximately 4.5 cm above the carina.  Patchy bilateral airspace opacities are again noted, stable or slightly increased since prior study.  Low lung volumes.  Heart is normal size.  No effusions.  IMPRESSION: Stable slight increased patchy bilateral airspace opacities.   Original Report Authenticated By: Charlett Nose, M.D.   Dg Chest Port 1 View  11/05/2012   *RADIOLOGY REPORT*  Clinical Data: Follow up respiratory failure  PORTABLE CHEST - 1 VIEW  Comparison: 11/03/2012  Findings: There is an endotracheal tube with tip above the carina. Left sided IJ catheter is noted with tip in the SVC.  Nasogastric tube is in place.  Heart size is normal.  Mild patchy airspace opacities within the right upper lobe and throughout the left lung are identified and appear slightly increased from previous exam.  IMPRESSION:  1.  Stable support apparatus. 2.  Slight increase and bilateral patchy airspace densities.   Original Report Authenticated By: Signa Kell, M.D.   Dg Chest Port 1 View  11/03/2012   *RADIOLOGY REPORT*  Clinical Data: Respiratory failure   PORTABLE CHEST - 1 VIEW  Comparison: Chest radiograph 11/02/2012  Findings: Endotracheal tube is 4.2 cm from carina in good position. Aortic balloon pump has been removed.  NG tube extends in the stomach.  Left central venous line is unchanged.  Stable cardiac silhouette with left basilar atelectasis.  There is some fine air space disease in the left lower lobe.   Right lung is clear.  IMPRESSION:  1.  Endotracheal tube in good position. 2.  Fine air space disease in the left lower lobe could represent edema, pneumonia, or pneumonitis.  Original Report Authenticated By: Genevive Bi, M.D.   Dg Chest Port 1 View  11/02/2012   *RADIOLOGY REPORT*  Clinical Data: Respiratory failure, shortness of breath  PORTABLE CHEST - 1 VIEW  Comparison:   the previous day's study  Findings: Low position of endotracheal tube, only 12 mm above carina.  Left IJ central line and nasogastric tube are stable in position.  Patchy left perihilar, retrocardiac, and patchy lower lung airspace opacities.  No effusion.  Heart size upper limits normal for technique.  A IABP tip is low, at the level of the left eighth rib.   IMPRESSION:  Low position of endotracheal tube and IABP as above.   Original Report Authenticated By: D. Andria Rhein, MD   Dg Chest Port 1 View  11/01/2012   *RADIOLOGY REPORT*  Clinical Data: Acute respiratory failure on ventilator.  Hiccups.  PORTABLE CHEST - 1 VIEW  Comparison: 11/01/2012  Findings: Support apparatus remains in appropriate position.  There is persistent opacity in the left retrocardiac lung base which is unchanged.  Right lung remains clear.  No new or worsening areas of pulmonary opacity are seen.  Heart size remains within normal limits.  IMPRESSION: Stable left retrocardiac opacity.   Original Report Authenticated By: Myles Rosenthal, M.D.   Dg Chest Port 1 View  11/01/2012   *RADIOLOGY REPORT*  Clinical Data: Respiratory failure  PORTABLE CHEST - 1 VIEW  Comparison:   the previous day's study   Findings: Endotracheal tube, nasogastric tube, and left IJ central line are stable in position.  IABP tip projects at the lower margin of the aortic arch as before.  Left retrocardiac consolidation / atelectasis slightly increased.  Right lung clear.  No effusion.  IMPRESSION:  1.  Some increase in left retrocardiac consolidation / atelectasis. 2. Support hardware stable in position.   Original Report Authenticated By: D. Andria Rhein, MD   Dg Chest Port 1 View  10/31/2012   *RADIOLOGY REPORT*  Clinical Data: Repositioning of the intra-aortic balloon pump.  PORTABLE CHEST - 1 VIEW  Comparison: Chest radiograph performed earlier today at 01:22 a.m.  Findings: The patient's intra-aortic balloon pump tip is seen at the aortic arch, approximately 1.5 cm above the level of the carina.  This could be retracted 1.5 cm, if deemed clinically necessary.  The left IJ line is noted ending about the distal SVC.  An external pacing pad is again noted overlying the left hemithorax.  The patient's endotracheal tube is seen ending 4 cm above the carina.  The lungs are well-aerated and appear grossly clear.  There is no evidence of focal opacification, pleural effusion or pneumothorax.  The cardiomediastinal silhouette is within normal limits.  No acute osseous abnormalities are seen.  IMPRESSION:  1.  Intra-aortic balloon pump tip noted at the aortic arch, 1.5 cm above the level of the carina.  This could be retracted 1.5 cm, if deemed clinically necessary. 2.  No other changes from the prior study.   Original Report Authenticated By: Tonia Ghent, M.D.   Dg Chest Portable 1 View  10/31/2012   *RADIOLOGY REPORT*  Clinical Data: Moved intra-aortic balloon and endotracheal tube.  PORTABLE CHEST - 1 VIEW  Comparison: Chest radiograph performed earlier today at 12:05 a.m.  Findings: The patient's endotracheal tube is seen ending 4 cm above the carina.  The patient's left IJ line is noted ending about the distal SVC. The  intra-aortic balloon pump tip is still noted overlying the aortic knob, approximately 2 cm  more superior than expected.  The lungs are clear bilaterally.  No focal consolidation, pleural effusion or pneumothorax is seen.  A left-sided external pacing pad is again noted.  The patient's enteric tube is seen ending at the fundus of the stomach.  No acute osseous abnormalities are seen.  IMPRESSION:  1.  Intra-aortic balloon pump tip still seen overlying the aortic arch, above the level of the carina, approximately 2 cm more superior than expected.  Would retract this by 2 cm, as deemed clinically appropriate. 2.  Endotracheal tube noted in expected position, ending 4 cm above the carina. 3.  No acute cardiopulmonary process seen.   Original Report Authenticated By: Tonia Ghent, M.D.   Dg Chest Portable 1 View  10/31/2012   *RADIOLOGY REPORT*  Clinical Data: Central line placement.  PORTABLE CHEST - 1 VIEW  Comparison: Chest radiograph performed 10/30/2012  Findings: The patient's endotracheal tube is seen ending 6-7 cm above the carina.  A left IJ line is noted ending about the distal SVC.  An enteric tube is noted extending below the diaphragm.  The patient's intra-aortic balloon pump tip has migrated approximately 2 cm superiorly since the prior study.  The lungs are well expanded.  Minimal left basilar density is thought to reflect normal vasculature and the overlying external pacing pad.  No pleural effusion or pneumothorax is seen.  The cardiomediastinal silhouette remains normal in size.  No acute osseous abnormalities are seen.  IMPRESSION:  1.  Left IJ line noted ending about the distal SVC. 2.  Endotracheal tube seen ending 6-7 cm above the carina.  Would advance this by 3-4 cm. 3.  Intra-aortic balloon pump tip has migrated 2 cm superiorly since the prior study. Would retract this by 2 cm. 4.  No acute cardiopulmonary process seen.  These results were called by telephone on 10/31/2012 at 12:31 a.m. to  Nursing on [id number], who verbally acknowledged these results.   Original Report Authenticated By: Tonia Ghent, M.D.   Dg Chest Port 1 View  10/30/2012   *RADIOLOGY REPORT*  Clinical Data: Assess intra-aortic balloon pump.  PORTABLE CHEST - 1 VIEW  Comparison: None.  Findings: The patient's endotracheal tube is seen ending 5-6 cm above the carina.  An enteric tube is noted extending below the diaphragm.  The tip of the intra-aortic balloon pump is seen just below the aortic arch, just above the level of the carina, in appropriate position.  The lungs are relatively well expanded.  Mild vascular congestion is noted, without definite pulmonary edema.  The external pacing pad is noted overlying the left hemithorax.  No pleural effusion or pneumothorax is seen.  The cardiomediastinal silhouette is normal in size.  No acute osseous abnormalities are identified.  IMPRESSION:  1.  Tip of the intra-aortic balloon pump noted just above the level of the carina and just below the aortic arch, in appropriate position. 2.  Endotracheal tube seen ending 5-6 cm above the carina. 3.  Mild vascular congestion, without definite pulmonary edema.   Original Report Authenticated By: Tonia Ghent, M.D.   Dg Abd Portable 1v  11/18/2012   *RADIOLOGY REPORT*  Clinical Data: Evaluate barium prior to G tube placement  PORTABLE ABDOMEN - 1 VIEW  Comparison: Prior KUB 11/17/2012  Findings: Barium contrast is noted throughout the small bowel. There is a moderate amount of retained barium in the gastric fundus and body of the stomach.  A nasogastric tube is in place.  The tip projects over the gastric  antrum.  There may be a small amount of contrast within the cecum.  No acute osseous abnormality.  No evidence of bowel obstruction.  IMPRESSION:  1.  Barium contrast noted throughout the small bowel with some residual contrast in the stomach. 2.  Recommend repeat KUB in 24 hours.   Original Report Authenticated By: Malachy Moan, M.D.    Dg Swallowing Func-speech Pathology  11/19/2012   Lenor Derrick, CCC-SLP     11/19/2012  2:59 PM Objective Swallowing Evaluation: Modified Barium Swallowing Study   Patient Details  Name: Walter Frazier MRN: [id number] Date of Birth: March 10, 1956  Today's Date: 11/19/2012 Time: 4098-1191 SLP Time Calculation (min): 32 min  Past Medical History: History reviewed. No pertinent past medical  history. Past Surgical History: History reviewed. No pertinent past  surgical history. HPI:  Walter Frazier is an 57 y.o. male with recent Cardiac arrest  and MI. Resp failure s/p tracheostomy, making slow progress.  Currently tolerating TF via NGT, but IR is requested to place  perc G-tube for expected prolonged dysphagia and PCM during  recovery.     Assessment / Plan / Recommendation Clinical Impression  Dysphagia Diagnosis: Mild oral phase dysphagia;Moderate  pharyngeal phase dysphagia Clinical impression: Pt. presents with a mild-moderate  oropharyngeal motor dysphagia, as evidenced by decreased bolus  formation and manipulation, decreased hyoid excursion and  laryngeal elevation, resulting in trace aspiration of thin  liquids, which pt. sensed and coughed aspirate from airway; (1  time penetration of nectar on first swallow) , and difficulty  chewing and manipulating solids in a timely manner.  Pt.'s  cognitive status placed him at a higher risk for aspiration, as  pt. is unable to follow compensatory strategies and is extremely  impulsive at this time.    Treatment Recommendation  Therapy as outlined in treatment plan below    Diet Recommendation Dysphagia 3 (Mechanical Soft);Nectar-thick  liquid (Chopped meats)   Liquid Administration via: Cup;No straw Medication Administration: Whole meds with puree Supervision: Patient able to self feed;Full supervision/cueing  for compensatory strategies Compensations: Slow rate;Small sips/bites (Place PMSV for all  po's) Postural Changes and/or Swallow Maneuvers: Seated upright 90   degrees    Other  Recommendations Oral Care Recommendations: Oral care  QID;Staff/trained caregiver to provide oral care Other Recommendations: Place PMSV during PO intake;Clarify  dietary restrictions;Have oral suction available;Order thickener  from pharmacy;Prohibited food (jello, ice cream, thin soups)   Follow Up Recommendations  Inpatient Rehab    Frequency and Duration min 2x/week  2 weeks   Pertinent Vitals/Pain n/a    SLP Swallow Goals Patient will consume recommended diet without observed clinical  signs of aspiration with: Minimal assistance;Supervision/safety Patient will utilize recommended strategies during swallow to  increase swallowing safety with: Minimal  assistance;Supervision/safety   General HPI: Walter Frazier is an 57 y.o. male with recent  Cardiac arrest and MI. Resp failure s/p tracheostomy, making slow  progress. Currently tolerating TF via NGT, but IR is requested to  place perc G-tube for expected prolonged dysphagia and PCM during  recovery. Type of Study: Modified Barium Swallowing Study Reason for Referral: Objectively evaluate swallowing function Previous Swallow Assessment: BSE 11/17/12, Cont. NPO Diet Prior to this Study: NPO Temperature Spikes Noted: No Respiratory Status: Trach Trach Size and Type: #6;With PMSV in place History of Recent Intubation: Yes Length of Intubations (days): 12 days Date extubated: 11/10/12 Behavior/Cognition: Alert;Cooperative;Pleasant  mood;Distractible;Requires cueing;Decreased sustained attention Oral Cavity - Dentition: Adequate natural dentition Oral Motor /  Sensory Function: Within functional limits Self-Feeding Abilities: Able to feed self;Needs assist  (Impulsive) Patient Positioning: Upright in chair Baseline Vocal Quality: Low vocal intensity Volitional Cough: Strong Volitional Swallow: Unable to elicit Anatomy:  (Trach in place) Pharyngeal Secretions: Not observed secondary MBS (Coughing  thick, tan, secretions (malordorous))    Reason for  Referral Objectively evaluate swallowing function   Oral Phase Oral Preparation/Oral Phase Oral Phase: Impaired Oral - Solids Oral - Puree: Impaired mastication;Weak lingual  manipulation;Piecemeal swallowing;Delayed oral transit Oral - Mechanical Soft: Impaired mastication;Weak lingual  manipulation;Piecemeal swallowing;Delayed oral transit Oral - Pill:  (Chewed pill)   Pharyngeal Phase Pharyngeal Phase Pharyngeal Phase: Impaired Pharyngeal - Nectar Pharyngeal - Nectar Teaspoon: Reduced epiglottic  inversion;Reduced anterior laryngeal mobility;Reduced laryngeal  elevation;Reduced airway/laryngeal closure;Reduced tongue base  retraction;Penetration/Aspiration during swallow (Initial swallow  only) Penetration/Aspiration details (nectar teaspoon): Material enters  airway, remains ABOVE vocal cords then ejected out (Initial  swallow only) Pharyngeal - Thin Pharyngeal - Thin Cup: Reduced epiglottic inversion;Reduced  anterior laryngeal mobility;Reduced laryngeal  elevation;Penetration/Aspiration during swallow;Trace aspiration Penetration/Aspiration details (thin cup): Material enters  airway, passes BELOW cords then ejected out Pharyngeal Phase - Comment Pharyngeal Comment: Positive cough response with aspiration, and  aspirate was cleared trom the trachea.  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 Not a [facility] candidate per Dr. Antoine Poche and Dr. Graciela Husbands. Continue current therapy. If BP remains stable will uptitrate beta blocker.      Quintella Reichert, MD  11/22/2012  9:41 AM

## 2012-11-22 NOTE — Progress Notes (Signed)
Pt noted sitting with knees on bed and buttocks in air with sitter trying to calm pt and get him to lie down. Nurse attempted to also, pt confused. After several attempts were able to get pt to lie back in bed. Dr. Darrick Penna notified.

## 2012-11-23 DIAGNOSIS — I5021 Acute systolic (congestive) heart failure: Secondary | ICD-10-CM

## 2012-11-23 DIAGNOSIS — I5022 Chronic systolic (congestive) heart failure: Secondary | ICD-10-CM

## 2012-11-23 MED ORDER — CARVEDILOL 6.25 MG PO TABS
6.2500 mg | ORAL_TABLET | Freq: Two times a day (BID) | ORAL | Status: DC
  Administered 2012-11-23: 6.25 mg via ORAL
  Filled 2012-11-23 (×4): qty 1

## 2012-11-23 MED ORDER — SPIRONOLACTONE 12.5 MG HALF TABLET
12.5000 mg | ORAL_TABLET | Freq: Every day | ORAL | Status: DC
  Administered 2012-11-23 – 2012-11-25 (×3): 12.5 mg via ORAL
  Filled 2012-11-23 (×3): qty 1

## 2012-11-23 NOTE — Progress Notes (Signed)
PULMONARY  / CRITICAL CARE MEDICINE  Name: Walter Frazier MRN: [id number] DOB: 07-29-1955    ADMISSION DATE:  10/30/2012 CONSULTATION DATE:  10/30/2012  REFERRING MD :  Diona Browner PRIMARY SERVICE: PCCM  CHIEF COMPLAINT:  Cardiac arrest  BRIEF PATIENT DESCRIPTION: 57 y/o male smoker with no known past medical history was admitted to [facility] on 5/29 after cardiac arrest from a STEMI.  He was comatose afterwards and was started on the hypothermia protocol and transferred to [facility].  SIGNIFICANT EVENTS / STUDIES:  5/29 - LHC > DES to prox LAD @ [facility]. IABP placed 5/29 - Transfer to [facility] for hypothermia 5/30 - Echo >> EF 25%, LV severely dilated 6/02 - RN noted ?myoclonus - sedation infusions resumed. Purposeful on WUA but not F/C.  6/02 - Neuro consult: EEG > non-convulsive status epilepticus. Resolution of these discharges after ativan. Loaded with fosphenytoin.  6/02 - CT head: NAD 6/03 - 24 hr EEG:  Diffuse slowing, suggestive of general encephalopathy 6/04 - Started on Keppra 6/06 - daily progress neuro wise, severe agiation 6/07 - weaned for 1 hour on PSV, agitation limiting factor 6/09 Passed SBT, extubated, tolerated approx 3 hrs. Very agitated, tachypneic despite dexmedetomidine. Reintubated. Trach planned 6/10 Trach tube placed 6/11 Foley removed. Urinary retention. Foley replaced. Urethral discharge noted. Cloudy urine. Ceftrx started for UTI 6/13 Transfer to SDU 6/14 Following commands!!! Fent patch, clonazepam doses decreased 6/17  G tube  Deferred 6/18 Trach changed to #6 cuffless Shiley 6/18 DIII diet initiated 6/20 decannulated. Ready for Rehab   LINES / TUBES: IABP 5/29 >> 6/01 ETT 5/29 >> 6/09, 6/09 >> 6/10 L IJ CVL 5/29 >> 6/10 L radial A-line 5/29 >> 6/4 Trach 6/10 >> 6/20 PICC 6/10 >> 6/20   CULTURES: MRSA PCR 5/29 >> neg Urine 6/12 >> E coli  ANTIBIOTICS: Ceftrx 6/11 >> 6/14  SUBJ/Overnight: Had a quiet night, no increased wob.  Mobilizing secretions  well.    PHYSICAL EXAMINATION: Gen: wd male, nad HEENT: Poor dentition, no nasal purulence or d/c.  Neck without LN or TMG PULM:clear to auscultation, although poor inspiratory effort. CV: RRR s M AB: BS+, soft, nontender Ext: warm/dry, no edema, no cyanosis Neuro: moves all ext with full strength, + F/C  LABS: BMET    Component Value Date/Time   NA 141 11/21/2012 1300   K 3.9 11/21/2012 1300   CL 105 11/21/2012 1300   CO2 26 11/21/2012 1300   GLUCOSE 141* 11/21/2012 1300   BUN 14 11/21/2012 1300   CREATININE 0.73 11/21/2012 1300   CALCIUM 8.8 11/21/2012 1300   GFRNONAA >90 11/21/2012 1300   GFRAA >90 11/21/2012 1300     CBC    Component Value Date/Time   WBC 10.8* 11/17/2012 0500   RBC 4.04* 11/17/2012 0500   HGB 12.6* 11/17/2012 0500   HCT 36.4* 11/17/2012 0500   PLT 629* 11/17/2012 0500   MCV 90.1 11/17/2012 0500   MCH 31.2 11/17/2012 0500   MCHC 34.6 11/17/2012 0500   RDW 12.7 11/17/2012 0500   LYMPHSABS 2.8 11/17/2012 0500   MONOABS 1.0 11/17/2012 0500   EOSABS 0.5 11/17/2012 0500   BASOSABS 0.1 11/17/2012 0500     ABG    Component Value Date/Time   PHART 7.437 11/11/2012 0430   PCO2ART 41.1 11/11/2012 0430   PO2ART 90.0 11/11/2012 0430   HCO3 27.7* 11/11/2012 0430   TCO2 29 11/11/2012 0430   ACIDBASEDEF 0.6 11/03/2012 0645   O2SAT 97.0 11/11/2012 0430  Recent Labs Lab 11/20/12 1658 11/21/12 0003 11/21/12 0844 11/21/12 1543 11/21/12 2109  GLUCAP 129* 141* 116* 130* 152*    CXR: no new film  ASSESSMENT / PLAN:  CARDIOVASCULAR A:  Cardiac arrest 5/29 - VT/VF Anterior wall STEMI 5/29, prox LAD occlusion - s/p DES PCI @ [facility] Cardiogenic shock, resolved.   HTN  P:  -continue enalapril, ASA, metoprolol  -Cards following  PULMONARY A:  Acute respiratory failure post cardiac arrest LLL Airspace Disease - atelectasis, resolved Failed extubation 6/09 Tracheostomy status >> now decannulated Comfortable on room air with no increased wob.   NEUROLOGIC A:    Post anoxic encephalopathy - much improved Status epilepticus, resolved Severe agitation - improved Apparent aphasia, resolved   P:   -continue current seizure medications per neuro.   Ready for CIR any time

## 2012-11-23 NOTE — Progress Notes (Signed)
Patient ID: Walter Frazier, male   DOB: Nov 17, 1955, 57 y.o.   MRN: [id number]   SUBJECTIVE:   Walking halls with nurse. Has hard time following simple commands. Denies CP or dyspnea but comprehension is limited.   PHYSICAL EXAM Filed Vitals:   11/22/12 2348 11/23/12 0200 11/23/12 0401 11/23/12 0837  BP: 109/80 114/75 106/78 119/84  Pulse: 87 78 92 100  Temp: 97.1 F (36.2 C)  97.7 F (36.5 C) 97.8 F (36.6 C)  TempSrc: Oral  Axillary Axillary  Resp: 18 20 18 19   Height:      Weight:      SpO2: 98% 97% 98% 97%   General:  No acute distress walking hall. Neck. Tracheostomy. No JVD Lungs:  Decreased breath sounds. clear Heart:  RRR no s3 Abdomen:  Positive bowel sounds, no rebound no guarding  echymosis in RLQ Extremities:  No edema Neuro: difficulty following commands. Strength ok   LABS:  No results found for this or any previous visit (from the past 24 hour(s)).  Intake/Output Summary (Last 24 hours) at 11/23/12 0959 Last data filed at 11/23/12 0007  Gross per 24 hour  Intake    120 ml  Output    300 ml  Net   -180 ml    ASSESSMENT AND PLAN:  1. CAD: S/P anterior MI with intervention and resultant anoxic brain injury. Continue plavix, statin, asa and b-blocker.   2. Anoxic brain injury   3. Acute on chronic resp failure: s/p trach (decannulated)  4. Cardiogenic shock: resolved  5. Acute systolic HF due to #1. EF 25% (echo 5/30) Now euvolemic. Will titrate meds. Get repeat echo to reassess EF. Given severity of anoxic injury i do not favor ICD or LifeVest.   6. COPD     Arvilla Meres 11/23/2012 9:59 AM

## 2012-11-24 DIAGNOSIS — I5021 Acute systolic (congestive) heart failure: Secondary | ICD-10-CM

## 2012-11-24 DIAGNOSIS — I369 Nonrheumatic tricuspid valve disorder, unspecified: Secondary | ICD-10-CM

## 2012-11-24 LAB — CBC
HCT: 39.4 % (ref 39.0–52.0)
Hemoglobin: 13.4 g/dL (ref 13.0–17.0)
MCH: 31.3 pg (ref 26.0–34.0)
MCV: 92.1 fL (ref 78.0–100.0)
Platelets: 554 10*3/uL — ABNORMAL HIGH (ref 150–400)
RBC: 4.28 MIL/uL (ref 4.22–5.81)
WBC: 15 10*3/uL — ABNORMAL HIGH (ref 4.0–10.5)

## 2012-11-24 LAB — BASIC METABOLIC PANEL
CO2: 24 mEq/L (ref 19–32)
Chloride: 103 mEq/L (ref 96–112)
Glucose, Bld: 134 mg/dL — ABNORMAL HIGH (ref 70–99)
Sodium: 138 mEq/L (ref 135–145)

## 2012-11-24 MED ORDER — ENALAPRIL MALEATE 2.5 MG PO TABS
2.5000 mg | ORAL_TABLET | Freq: Two times a day (BID) | ORAL | Status: DC
  Administered 2012-11-24 – 2012-11-25 (×3): 2.5 mg via ORAL
  Filled 2012-11-24 (×4): qty 1

## 2012-11-24 MED ORDER — PERFLUTREN LIPID MICROSPHERE
1.0000 mL | INTRAVENOUS | Status: AC | PRN
  Administered 2012-11-24: 2 mL via INTRAVENOUS
  Filled 2012-11-24: qty 10

## 2012-11-24 MED ORDER — CARVEDILOL 6.25 MG PO TABS
9.3750 mg | ORAL_TABLET | Freq: Two times a day (BID) | ORAL | Status: DC
  Administered 2012-11-24 – 2012-11-25 (×3): 9.375 mg via ORAL
  Filled 2012-11-24 (×4): qty 1

## 2012-11-24 NOTE — Progress Notes (Signed)
Subjective: NO CP   Breathing is OK  He is scared  Tearful  Eating breakfast Objective: Filed Vitals:   11/23/12 2000 11/24/12 0011 11/24/12 0328 11/24/12 0335  BP: 101/68 105/79 112/76   Pulse: 96 120    Temp: 98.4 F (36.9 C) 96.7 F (35.9 C) 97.1 F (36.2 C)   TempSrc: Oral Axillary Axillary   Resp: 20 20 18    Height:      Weight:  152 lb 1.9 oz (69 kg)    SpO2: 99%   100%   Weight change:   Intake/Output Summary (Last 24 hours) at 11/24/12 0816 Last data filed at 11/24/12 0555  Gross per 24 hour  Intake    717 ml  Output    750 ml  Net    -33 ml   Tele:  SR to SR (70s to 100s)  General: Alert, awake, oriented x3, in no acute distress Neck:  JVP is normal Heart: Regular rate and rhythm, without murmurs, rubs, gallops.  Lungs: Clear to auscultation.  No rales or wheezes. Exemities:  No edema.   Neuro: Grossly intact, nonfocal.   Lab Results: Results for orders placed during the hospital encounter of 10/30/12 (from the past 24 hour(s))  BASIC METABOLIC PANEL     Status: Abnormal   Collection Time    11/24/12  5:00 AM      Result Value Range   Sodium 138  135 - 145 mEq/L   Potassium 4.5  3.5 - 5.1 mEq/L   Chloride 103  96 - 112 mEq/L   CO2 24  19 - 32 mEq/L   Glucose, Bld 134 (*) 70 - 99 mg/dL   BUN 14  6 - 23 mg/dL   Creatinine, Ser 1.61  0.50 - 1.35 mg/dL   Calcium 8.9  8.4 - 09.6 mg/dL   GFR calc non Af Amer >90  >90 mL/min   GFR calc Af Amer >90  >90 mL/min  CBC     Status: Abnormal   Collection Time    11/24/12  5:00 AM      Result Value Range   WBC 15.0 (*) 4.0 - 10.5 K/uL   RBC 4.28  4.22 - 5.81 MIL/uL   Hemoglobin 13.4  13.0 - 17.0 g/dL   HCT 04.5  40.9 - 81.1 %   MCV 92.1  78.0 - 100.0 fL   MCH 31.3  26.0 - 34.0 pg   MCHC 34.0  30.0 - 36.0 g/dL   RDW 91.4  78.2 - 95.6 %   Platelets 554 (*) 150 - 400 K/uL    Studies/Results: @RISRSLT24 @  Medications: Reviewed   @PROBHOSP @  1.  Ischemic cardiomyopathy.  Volume status looks good.  I  would recomm cuttng back on enalopril and increase coreg slowly (9.75 bid) for better HR control. Continue aldactone Follow BP  2.  CAD  S/p PTCA/DES to proximal LAD  LVEF 25%  Will review outside cath  3.  HL  Continue lipitor  LOS: 25 days   Dietrich Pates 11/24/2012, 8:16 AM

## 2012-11-24 NOTE — Progress Notes (Signed)
  Echocardiogram 2D Echocardiogram limited with Definity has been performed.  Cathie Beams 11/24/2012, 11:34 AM

## 2012-11-24 NOTE — Progress Notes (Signed)
I await further therapy notes to assist with determining appropriate rehab venue as well as to discuss with [employer]. [phone]

## 2012-11-24 NOTE — Progress Notes (Signed)
PULMONARY  / CRITICAL CARE MEDICINE  Name: Walter Frazier MRN: [id number] DOB: 11-04-1955    ADMISSION DATE:  10/30/2012 CONSULTATION DATE:  10/30/2012  REFERRING MD :  Diona Browner PRIMARY SERVICE: PCCM  CHIEF COMPLAINT:  Cardiac arrest  BRIEF PATIENT DESCRIPTION: 57 y/o male smoker with no known past medical history was admitted to [facility] on 5/29 after cardiac arrest from a STEMI.  He was comatose afterwards and was started on the hypothermia protocol and transferred to [facility].  SIGNIFICANT EVENTS / STUDIES:  5/29 - LHC > DES to prox LAD @ [facility]. IABP placed 5/29 - Transfer to [facility] for hypothermia 5/30 - Echo >> EF 25%, LV severely dilated 6/02 - RN noted ?myoclonus - sedation infusions resumed. Purposeful on WUA but not F/C.  6/02 - Neuro consult: EEG > non-convulsive status epilepticus. Resolution of these discharges after ativan. Loaded with fosphenytoin.  6/02 - CT head: NAD 6/03 - 24 hr EEG:  Diffuse slowing, suggestive of general encephalopathy 6/04 - Started on Keppra 6/06 - daily progress neuro wise, severe agiation 6/07 - weaned for 1 hour on PSV, agitation limiting factor 6/09 Passed SBT, extubated, tolerated approx 3 hrs. Very agitated, tachypneic despite dexmedetomidine. Reintubated. Trach planned 6/10 Trach tube placed 6/11 Foley removed. Urinary retention. Foley replaced. Urethral discharge noted. Cloudy urine. Ceftrx started for UTI 6/13 Transfer to SDU 6/14 Following commands!!! Fent patch, clonazepam doses decreased 6/17  G tube  Deferred 6/18 Trach changed to #6 cuffless Shiley 6/18 DIII diet initiated 6/20 decannulated. Ready for Rehab   LINES / TUBES: IABP 5/29 >> 6/01 ETT 5/29 >> 6/09, 6/09 >> 6/10 L IJ CVL 5/29 >> 6/10 L radial A-line 5/29 >> 6/4 Trach 6/10 >> 6/20 PICC 6/10 >> 6/20   CULTURES: MRSA PCR 5/29 >> neg Urine 6/12 >> E coli  ANTIBIOTICS: Ceftrx 6/11 >> 6/14  SUBJ/Overnight: Had a quiet night, no increased wob.  Mobilizing secretions  well.    PHYSICAL EXAMINATION: Gen: wd male, nad HEENT: Poor dentition, no nasal purulence or d/c.  Neck without LN or TMG PULM:clear to auscultation, although poor inspiratory effort. CV: RRR s M AB: BS+, soft, nontender Ext: warm/dry, no edema, no cyanosis Neuro: moves all ext with full strength, + F/C  LABS: BMET    Component Value Date/Time   NA 138 11/24/2012 0500   K 4.5 11/24/2012 0500   CL 103 11/24/2012 0500   CO2 24 11/24/2012 0500   GLUCOSE 134* 11/24/2012 0500   BUN 14 11/24/2012 0500   CREATININE 0.72 11/24/2012 0500   CALCIUM 8.9 11/24/2012 0500   GFRNONAA >90 11/24/2012 0500   GFRAA >90 11/24/2012 0500     CBC    Component Value Date/Time   WBC 15.0* 11/24/2012 0500   RBC 4.28 11/24/2012 0500   HGB 13.4 11/24/2012 0500   HCT 39.4 11/24/2012 0500   PLT 554* 11/24/2012 0500   MCV 92.1 11/24/2012 0500   MCH 31.3 11/24/2012 0500   MCHC 34.0 11/24/2012 0500   RDW 13.4 11/24/2012 0500   LYMPHSABS 2.8 11/17/2012 0500   MONOABS 1.0 11/17/2012 0500   EOSABS 0.5 11/17/2012 0500   BASOSABS 0.1 11/17/2012 0500     ABG    Component Value Date/Time   PHART 7.437 11/11/2012 0430   PCO2ART 41.1 11/11/2012 0430   PO2ART 90.0 11/11/2012 0430   HCO3 27.7* 11/11/2012 0430   TCO2 29 11/11/2012 0430   ACIDBASEDEF 0.6 11/03/2012 0645   O2SAT 97.0 11/11/2012 0430  Recent Labs Lab 11/20/12 1658 11/21/12 0003 11/21/12 0844 11/21/12 1543 11/21/12 2109  GLUCAP 129* 141* 116* 130* 152*    CXR: no new film  ASSESSMENT / PLAN:  CARDIOVASCULAR A:  Cardiac arrest 5/29 - VT/VF Anterior wall STEMI 5/29, prox LAD occlusion - s/p DES PCI @ [facility] Cardiogenic shock, resolved.   HTN  P:  -continue enalapril, ASA, metoprolol  -Cards following  PULMONARY A:  Acute respiratory failure post cardiac arrest LLL Airspace Disease - atelectasis, resolved Failed extubation 6/09 Tracheostomy status >> now decannulated Comfortable on room air with no increased wob.   NEUROLOGIC A:   Post  anoxic encephalopathy - much improved Status epilepticus, resolved Severe agitation - improved; still with some impulsive behavior.  Apparent aphasia, resolved   P:   -continue current seizure medications -sitter in the room due to impulsive behavior; pt denies being confused  Dispo - Probably Ready for CIR, await their input about a transfer. OK for floor bed with a sitter  Levy Pupa, MD, PhD 11/24/2012, 1:53 PM  Pulmonary and Critical Care [phone] or if no answer [phone]

## 2012-11-24 NOTE — Progress Notes (Signed)
Heard bed alarm, responded to room and found patient sitting on floor. Patient is confused and can not report what happened.  No visible signs of injury on patient, no complaints of pain per patient. Patient stood with assistance and returned to bed. Vitals 113/75, HR 104, RR 19, O2 sat 100% on room air.  MD was notified and am continuing to monitor patient.

## 2012-11-24 NOTE — Progress Notes (Signed)
Reviewed therapy notes from today. I will update insurance and await their approval to admit pt to inpt rehab. [phone]

## 2012-11-24 NOTE — Progress Notes (Signed)
Occupational Therapy Treatment Patient Details Name: Walter Frazier MRN: [id number] DOB: 04/01/1956 Today's Date: 11/24/2012 Time: 8657-8469 OT Time Calculation (min): 28 min  OT Assessment / Plan / Recommendation Comments on Treatment Session This 57 yo admitted to [facility] on 5/29 after cardiac arrest from a STEMI, comatose, hypoxia, started on the hypothermia protocol and transferred to [facility]. 6/10 Trach tube placed. PTA, pt was independent with ADL and mobility and worked full time. Pt has supportive wife who can provide 24/7 assistance after D/C presents to acute OT making progress with big limiting factor being cognition. Will continue to benefit from acute OT with follow up on inpatient rehab.to get to a S leve.    Follow Up Recommendations  CIR       Equipment Recommendations  None recommended by OT    Recommendations for Other Services Rehab consult  Frequency Min 3X/week   Plan Discharge plan remains appropriate    Precautions / Restrictions Precautions Precautions: Fall Precaution Comments: pt with difficulty tracking visually to the Left Restrictions Weight Bearing Restrictions: No       ADL  Grooming: Performed;Wash/dry face;Supervision/safety;Set up (with increased time and verbal/gestural cues) Where Assessed - Grooming: Supported standing Toilet Transfer: Performed (see ADL comments below) Toilet Transfer Method: Sit to Barista: Regular height toilet;Grab bars Toileting - Clothing Manipulation and Hygiene: Performed;+1 Total assistance Where Assessed - Engineer, mining and Hygiene: Standing Equipment Used: Gait belt Transfers/Ambulation Related to ADLs: Min A +1 (HHA) ADL Comments: Pt was incontinent of feces so we had him go into the bathroom; however we could not acutally get him to sit down on the toliet with verbal, gestural, and/or tactile cues. We tried for over 5 minutes any way we could think of and he would not sit  down on the commode.  Had him stand in front of a board with various color 3 dimensional flowers and asked him to find the yellow ones--he point to many flowers but they were not all yellow and he totally missed the ones on his right side. When pointing to a flower and asking him the color he the only one that he got right was the pink one.  Also had him read room numbers with me pointing to the numbers one at a time --on the first room  "2" was always 7, but then he got the other 3 numbersl; on the second rooom the "2" was again a 7 and then the other numbers were also all "7"s--perseveration. Pt was able to name is daughter and two grand-daughters that arrived without increased time. He was able to state the name of his mother-in-law who was in the room ([place]) but not that she was his mother-in-law. He was able to state his wife's name and that she was is wife with increased time     OT Goals ADL Goals Pt Will Perform Grooming: with min assist;Unsupported;Sitting, chair;with cueing (comment type and amount) ADL Goal: Grooming - Progress: Met Pt Will Perform Upper Body Bathing: with min assist;Sitting, chair Pt Will Perform Upper Body Dressing: with mod assist;Sitting, chair;with cueing (comment type and amount) Pt Will Transfer to Toilet: with min assist;Ambulation;with DME;3-in-1 ADL Goal: Toilet Transfer - Progress: Progressing toward goals Additional ADL Goal #1: Pt will demonstrate emergent awreness during ADL with min tactile.vc.  Visit Information  Last OT Received On: 11/24/12 Assistance Needed: +1 PT/OT Co-Evaluation/Treatment: Yes    Subjective Data  Subjective: "Y'all are trying to confuse me" (when asking him  to turn right or left--he would start in the wrong direction and we would correct him to go the way we had asked him to)      Cognition  Cognition Arousal/Alertness: Awake/alert Behavior During Therapy: Impulsive Overall Cognitive Status: Impaired/Different from  baseline Area of Impairment: Orientation;Attention;Memory;Following commands;Safety/judgement;Awareness;Problem solving Orientation Level: Disoriented to;Place;Time;Situation Current Attention Level: Sustained Memory: Decreased short-term memory Following Commands: Follows one step commands inconsistently Safety/Judgement: Decreased awareness of safety;Decreased awareness of deficits Awareness: Intellectual Problem Solving: Slow processing;Decreased initiation;Difficulty sequencing;Requires verbal cues;Requires tactile cues (requires gestural cues) General Comments: pt with noted receptive and expressive difficulties, impaired comprehension, inability to sequence, apraxia, and perseveration.     Mobility  Bed Mobility Bed Mobility: Supine to Sit;Sitting - Scoot to Edge of Bed Supine to Sit: 4: Min assist;HOB flat Sitting - Scoot to [place] of Bed: 4: Min assist Details for Bed Mobility Assistance: max directional verbal and tactile cues to complete task. pt perseverated on "why do I need to get up?" Transfers Sit to Stand: 3: Mod assist;With upper extremity assist;From bed Stand to Sit: 4: Min assist;With upper extremity assist;To bed Details for Transfer Assistance: maximal tactile and verbal cues to complete task due to pt with apraxia and minimal comprehension. despite requesting to use the bathroom pt would not sit on the toliet with maximal tactile cues.          End of Session OT - End of Session Equipment Utilized During Treatment: Gait belt Activity Tolerance: Patient tolerated treatment well Patient left: in bed;with family/visitor present (with sitter)       Evette Georges [phone] 11/24/2012, 2:52 PM

## 2012-11-24 NOTE — Progress Notes (Signed)
Physical Therapy Treatment Patient Details Name: Walter Frazier MRN: [id number] DOB: [birth year] Today's Date: 11/24/2012 Time: 2841-3244 PT Time Calculation (min): 27 min  PT Assessment / Plan / Recommendation Comments on Treatment Session  Pt progressing from mobility stand point as demo'd by increased ambulation distance without AD however remains to have significant balance impairments requiring minA for safe amb. Pt remains to have severe cognitive deficits including impaired comprehension, sequencing, and ability to follow commands. Pt demo's apraxia tendencies, word finding difficulties and perseveration. Noted vision deficits as well. Pt remains an excellent canidate for CIR to address mentioned deficits to achieve maximal functional recovery for safe transition home with spouse.    Follow Up Recommendations  CIR     Does the patient have the potential to tolerate intense rehabilitation     Barriers to Discharge        Equipment Recommendations       Recommendations for Other Services Rehab consult  Frequency Min 4X/week   Plan Discharge plan remains appropriate;Frequency needs to be updated    Precautions / Restrictions Precautions Precautions: Fall Precaution Comments: pt with difficulty tracking visually to the Left Restrictions Weight Bearing Restrictions: No   Pertinent Vitals/Pain Pt denies pain    Mobility  Bed Mobility Bed Mobility: Supine to Sit;Sitting - Scoot to Edge of Bed Supine to Sit: 4: Min assist;HOB flat Sitting - Scoot to [place] of Bed: 4: Min assist Details for Bed Mobility Assistance: max directional verbal and tactile cues to complete task. pt perseverated on "why do I need to get up?" Transfers Transfers: Sit to Stand;Stand to Sit Sit to Stand: 3: Mod assist;With upper extremity assist;From bed Stand to Sit: 4: Min assist;With upper extremity assist;To bed Details for Transfer Assistance: maximal tactile and verbal cues to complete task due  to pt with apraxia and minimal comprehension. despite requesting to use the bathroom pt would not sit on the toliet with maximal tactile cues. Ambulation/Gait Ambulation/Gait Assistance: 2: Max assist Ambulation Distance (Feet): 120 Feet Assistive device: 1 person hand held assist Ambulation/Gait Assistance Details: directional tactile and verbal cues. cross over gait when pt had episode of LOB. pt unable to follow directional cues such as "turn right, turn left." Gait Pattern: Step-through pattern;Decreased stride length;Narrow base of support;Decreased hip/knee flexion - right;Decreased hip/knee flexion - left Gait velocity: slow Stairs: No    Exercises     PT Diagnosis:    PT Problem List:   PT Treatment Interventions:     PT Goals Acute Rehab PT Goals PT Goal: Supine/Side to Sit - Progress: Progressing toward goal PT Goal: Sit to Stand - Progress: Progressing toward goal PT Transfer Goal: Bed to Chair/Chair to Bed - Progress: Progressing toward goal PT Goal: Stand - Progress: Progressing toward goal PT Goal: Ambulate - Progress: Progressing toward goal  Visit Information  Last PT Received On: 11/24/12 Assistance Needed: +1    Subjective Data  Subjective: Pt received supine in bed agreeable to PT   Cognition  Cognition Arousal/Alertness: Awake/alert Behavior During Therapy: Impulsive Overall Cognitive Status: Impaired/Different from baseline Area of Impairment: Orientation;Attention;Memory;Following commands;Safety/judgement;Awareness;Problem solving Orientation Level: Disoriented to;Place;Time;Situation Current Attention Level: Focused Memory: Decreased short-term memory Following Commands: Follows one step commands inconsistently Safety/Judgement: Decreased awareness of safety;Decreased awareness of deficits Awareness: Intellectual Problem Solving: Slow processing;Decreased initiation;Difficulty sequencing;Requires verbal cues;Requires tactile cues General Comments:  pt with noted receptive aphasia, impaired comprehension, and inability to sequence. Pt also with noted apraxia. Pt with perseveration. Pt perseverated on the number 7  when asked to repeat the numbers [phone] on door.     Balance     End of Session PT - End of Session Equipment Utilized During Treatment: Gait belt Activity Tolerance: Patient tolerated treatment well Patient left: in bed;with call bell/phone within reach;with family/visitor present (sitter present) Nurse Communication: Mobility status   GP     Marcene Brawn 11/24/2012, 1:46 PM  Lewis Shock, PT, DPT Pager #: [phone] Office #: [phone]

## 2012-11-25 ENCOUNTER — Inpatient Hospital Stay (HOSPITAL_COMMUNITY)
Admission: RE | Admit: 2012-11-25 | Discharge: 2012-12-04 | DRG: 462 | Disposition: A | Discharge status: Home / Self Care | Payer: BC Managed Care – PPO | Source: Intra-hospital | Attending: Physical Medicine & Rehabilitation | Admitting: Physical Medicine & Rehabilitation

## 2012-11-25 DIAGNOSIS — R1310 Dysphagia, unspecified: Secondary | ICD-10-CM

## 2012-11-25 DIAGNOSIS — Z930 Tracheostomy status: Secondary | ICD-10-CM

## 2012-11-25 DIAGNOSIS — G931 Anoxic brain damage, not elsewhere classified: Secondary | ICD-10-CM

## 2012-11-25 DIAGNOSIS — N390 Urinary tract infection, site not specified: Secondary | ICD-10-CM

## 2012-11-25 DIAGNOSIS — Z66 Do not resuscitate: Secondary | ICD-10-CM

## 2012-11-25 DIAGNOSIS — E785 Hyperlipidemia, unspecified: Secondary | ICD-10-CM

## 2012-11-25 DIAGNOSIS — G40909 Epilepsy, unspecified, not intractable, without status epilepticus: Secondary | ICD-10-CM

## 2012-11-25 DIAGNOSIS — F17200 Nicotine dependence, unspecified, uncomplicated: Secondary | ICD-10-CM

## 2012-11-25 DIAGNOSIS — R5381 Other malaise: Secondary | ICD-10-CM

## 2012-11-25 DIAGNOSIS — I10 Essential (primary) hypertension: Secondary | ICD-10-CM

## 2012-11-25 DIAGNOSIS — Z5189 Encounter for other specified aftercare: Principal | ICD-10-CM

## 2012-11-25 DIAGNOSIS — J95821 Acute postprocedural respiratory failure: Secondary | ICD-10-CM

## 2012-11-25 DIAGNOSIS — I2510 Atherosclerotic heart disease of native coronary artery without angina pectoris: Secondary | ICD-10-CM

## 2012-11-25 DIAGNOSIS — Z8674 Personal history of sudden cardiac arrest: Secondary | ICD-10-CM

## 2012-11-25 DIAGNOSIS — I469 Cardiac arrest, cause unspecified: Secondary | ICD-10-CM

## 2012-11-25 DIAGNOSIS — Z9861 Coronary angioplasty status: Secondary | ICD-10-CM

## 2012-11-25 DIAGNOSIS — J9600 Acute respiratory failure, unspecified whether with hypoxia or hypercapnia: Secondary | ICD-10-CM

## 2012-11-25 LAB — CBC
MCH: 30.9 pg (ref 26.0–34.0)
MCHC: 34.1 g/dL (ref 30.0–36.0)
Platelets: 388 10*3/uL (ref 150–400)
RDW: 13.2 % (ref 11.5–15.5)

## 2012-11-25 LAB — CREATININE, SERUM: Creatinine, Ser: 0.71 mg/dL (ref 0.50–1.35)

## 2012-11-25 MED ORDER — CLOPIDOGREL BISULFATE 75 MG PO TABS
75.0000 mg | ORAL_TABLET | Freq: Every day | ORAL | Status: DC
  Administered 2012-11-26 – 2012-12-04 (×9): 75 mg via ORAL
  Filled 2012-11-25 (×11): qty 1

## 2012-11-25 MED ORDER — ENALAPRIL MALEATE 2.5 MG PO TABS
2.5000 mg | ORAL_TABLET | Freq: Two times a day (BID) | ORAL | Status: DC
  Administered 2012-11-25 – 2012-11-28 (×7): 2.5 mg via ORAL
  Filled 2012-11-25 (×10): qty 1

## 2012-11-25 MED ORDER — ASPIRIN 81 MG PO CHEW
81.0000 mg | CHEWABLE_TABLET | Freq: Every day | ORAL | Status: DC
  Administered 2012-11-26 – 2012-12-04 (×9): 81 mg via ORAL
  Filled 2012-11-25 (×10): qty 1

## 2012-11-25 MED ORDER — SORBITOL 70 % SOLN: 30.0000 mL | Freq: Every day | Status: DC | PRN

## 2012-11-25 MED ORDER — PHENYTOIN 125 MG/5ML PO SUSP
200.0000 mg | Freq: Two times a day (BID) | ORAL | Status: DC
  Filled 2012-11-25 (×2): qty 8

## 2012-11-25 MED ORDER — VALPROIC ACID 250 MG PO CAPS
500.0000 mg | ORAL_CAPSULE | Freq: Two times a day (BID) | ORAL | Status: DC
  Administered 2012-11-25 – 2012-11-28 (×6): 500 mg via ORAL
  Filled 2012-11-25 (×10): qty 2

## 2012-11-25 MED ORDER — ENSURE COMPLETE PO LIQD
237.0000 mL | Freq: Three times a day (TID) | ORAL | Status: DC
  Administered 2012-11-25 – 2012-11-30 (×11): 237 mL via ORAL

## 2012-11-25 MED ORDER — ATORVASTATIN CALCIUM 80 MG PO TABS
80.0000 mg | ORAL_TABLET | Freq: Every day | ORAL | Status: DC
  Administered 2012-11-26 – 2012-12-03 (×8): 80 mg via ORAL
  Filled 2012-11-25 (×9): qty 1

## 2012-11-25 MED ORDER — ENOXAPARIN SODIUM 40 MG/0.4ML ~~LOC~~ SOLN: 40.0000 mg | Freq: Every day | SUBCUTANEOUS | Status: DC

## 2012-11-25 MED ORDER — LEVALBUTEROL HCL 0.63 MG/3ML IN NEBU
0.6300 mg | INHALATION_SOLUTION | RESPIRATORY_TRACT | Status: DC | PRN
  Filled 2012-11-25: qty 3

## 2012-11-25 MED ORDER — CARVEDILOL 6.25 MG PO TABS
9.3750 mg | ORAL_TABLET | Freq: Two times a day (BID) | ORAL | Status: DC
  Administered 2012-11-26 – 2012-11-28 (×6): 9.375 mg via ORAL
  Filled 2012-11-25 (×11): qty 1

## 2012-11-25 MED ORDER — ACETAMINOPHEN 325 MG PO TABS
325.0000 mg | ORAL_TABLET | ORAL | Status: DC | PRN
  Administered 2012-11-28: 650 mg via ORAL
  Filled 2012-11-25: qty 2

## 2012-11-25 MED ORDER — QUETIAPINE FUMARATE 50 MG PO TABS
50.0000 mg | ORAL_TABLET | Freq: Every day | ORAL | Status: DC
  Administered 2012-11-25 – 2012-11-26 (×2): 50 mg via ORAL
  Filled 2012-11-25 (×3): qty 1

## 2012-11-25 MED ORDER — ENOXAPARIN SODIUM 40 MG/0.4ML ~~LOC~~ SOLN
40.0000 mg | SUBCUTANEOUS | Status: DC
  Administered 2012-11-26 – 2012-12-03 (×8): 40 mg via SUBCUTANEOUS
  Filled 2012-11-25 (×9): qty 0.4

## 2012-11-25 MED ORDER — SPIRONOLACTONE 12.5 MG HALF TABLET
12.5000 mg | ORAL_TABLET | Freq: Every day | ORAL | Status: DC
  Administered 2012-11-26 – 2012-12-04 (×9): 12.5 mg via ORAL
  Filled 2012-11-25 (×10): qty 1

## 2012-11-25 MED ORDER — RESOURCE THICKENUP CLEAR PO POWD
ORAL | Status: DC | PRN
  Filled 2012-11-25: qty 125

## 2012-11-25 MED ORDER — PHENYTOIN 125 MG/5ML PO SUSP
200.0000 mg | Freq: Two times a day (BID) | ORAL | Status: DC
  Administered 2012-11-25 – 2012-11-28 (×6): 200 mg via ORAL
  Filled 2012-11-25 (×8): qty 8

## 2012-11-25 MED ORDER — ONDANSETRON HCL 4 MG PO TABS: 4.0000 mg | ORAL_TABLET | Freq: Four times a day (QID) | ORAL | Status: DC | PRN

## 2012-11-25 MED ORDER — ONDANSETRON HCL 4 MG/2ML IJ SOLN: 4.0000 mg | Freq: Four times a day (QID) | INTRAMUSCULAR | Status: DC | PRN

## 2012-11-25 NOTE — Progress Notes (Signed)
Physical Therapy Treatment Patient Details Name: Walter Frazier MRN: [id number] DOB: 1955-07-19 Today's Date: 11/25/2012 Time: 1335-1400 PT Time Calculation (min): 25 min  PT Assessment / Plan / Recommendation Comments on Treatment Session  Pt demo'd significant improvement this date. Pt able to follow 1 step commands 75% of the time and was able to read the room numbers 50% of the time without perseveration. Pt able to identify colors approx 75% of the time as well. Pt progressing with physical mobility as well however remains to have high level balance deficits. Pt con't to be an excellent canidate for CIR to achieve safe mod I function for safe transition home with spouse.    Follow Up Recommendations  CIR     Does the patient have the potential to tolerate intense rehabilitation     Barriers to Discharge        Equipment Recommendations       Recommendations for Other Services    Frequency Min 4X/week   Plan Discharge plan remains appropriate;Frequency needs to be updated    Precautions / Restrictions Precautions Precautions: Fall Precaution Comments: pt with noted visual deficits to the Right this date. Restrictions Weight Bearing Restrictions: No   Pertinent Vitals/Pain Pt denies pain    Mobility  Bed Mobility Bed Mobility: Supine to Sit Supine to Sit: 4: Min guard Details for Bed Mobility Assistance: minimal tactile and verbal cues to complete task Transfers Transfers: Sit to Stand;Stand to Sit Sit to Stand: 4: Min assist;With upper extremity assist;From bed Stand to Sit: 4: Min assist;With upper extremity assist;To bed Details for Transfer Assistance: minimal verbal cues to sequencing task Ambulation/Gait Ambulation/Gait Assistance: 4: Min assist Ambulation Distance (Feet): 200 Feet Assistive device: 1 person hand held assist Ambulation/Gait Assistance Details: pt able to follow directional cues majority of the time, occasional LOB requiring minA Gait  Pattern: Step-through pattern Stairs: No    Exercises     PT Diagnosis:    PT Problem List:   PT Treatment Interventions:     PT Goals Acute Rehab PT Goals PT Goal Formulation: With patient Time For Goal Achievement: 12/03/12 Potential to Achieve Goals: Good PT Goal: Supine/Side to Sit - Progress: Progressing toward goal PT Goal: Sit to Stand - Progress: Progressing toward goal PT Transfer Goal: Bed to Chair/Chair to Bed - Progress: Progressing toward goal PT Goal: Stand - Progress: Progressing toward goal PT Goal: Ambulate - Progress: Progressing toward goal  Visit Information  Last PT Received On: 11/25/12 Assistance Needed: +1    Subjective Data  Subjective: Pt received supine in bed with improved cognition. Pt able to identify PT and remembered PT from yesterday.   Cognition  Cognition Arousal/Alertness: Awake/alert Behavior During Therapy: WFL for tasks assessed/performed Overall Cognitive Status: Impaired/Different from baseline Area of Impairment: Orientation;Awareness;Problem solving;Safety/judgement Orientation Level: Time Current Attention Level: Sustained Memory:  (improved from yesterday) Following Commands: Follows one step commands with increased time Awareness: Intellectual Problem Solving: Slow processing General Comments: pt much improved from yesterday however still remains to have impaired comprehension, delayed processing    Balance     End of Session PT - End of Session Equipment Utilized During Treatment: Gait belt Activity Tolerance: Patient tolerated treatment well Patient left: in bed;with call bell/phone within reach;with family/visitor present Nurse Communication: Mobility status   GP     Marcene Brawn 11/25/2012, 2:33 PM   Lewis Shock, PT, DPT Pager #: [phone] Office #: [phone]

## 2012-11-25 NOTE — Progress Notes (Signed)
[employer] insurance has denied request to admit pt to inpt rehab. I am requesting a peer to peer review with Dr. Riley Kill and insurance provider MD. I have contacted pt's wife and she is aware as well as RN CM. [phone]

## 2012-11-25 NOTE — H&P (Signed)
Physical Medicine and Rehabilitation Admission H&P  CC: Anoxic brain injury  HPI: Walter Frazier is a 57 y.o. male admitted with prior history of 1 ppd tobacco who had out of hospital cardiac arrest and was taken to [facility]. He was taken to cardiac catheterization lab given ST elevation on ECG in setting of cardiac arrest with findings of proximal LAD occlusion treated with atherectomy and DES to proximal LAD. Echo with EF 25% with severely dilated LV. Post procedure comatose with acute respiratory failure requiring intubation and treated with hypothermia protocol. He was noted to have posturing post warming and neurology consulted due to concerns of hypoxic injury. CT head without acute changes. EEG consistent with nonconvulsive status epilepticus and patient started dilantin and valproic acid. Bouts of agitation and restlessness with sitter provided for safety. He failed extubation requiring trach placement 11/11/12 and patient started on vent wean later decannulated 11/21/2012. No current plan for life vest at this time as patient is DO NOT RESUSCITATE. Patient has difficulty following commands with ataxic movements and posterior lean with difficulty advancing feet. MBS done 06/18 and patient started on D3, nectar liquids. Physical and occupational therapy evaluations completed an ongoing. Patient was felt to be a good candidate for inpatient rehabilitation services and was admitted for comprehensive rehabilitation program  Review of Systems  Gastrointestinal: Positive for constipation.  Musculoskeletal: Positive for back pain.  Psychiatric/Behavioral: The patient has insomnia.  All other systems reviewed and are negative.   History reviewed. No pertinent past medical history  History reviewed. No pertinent past medical history.  Past Surgical History   Procedure  Laterality  Date   .  Back surgery       ~20 years ago by Dr. Jeral Fruit    Family History   Problem  Relation   Age of Onset   .  Heart disease  Father     Social History: Married. Used to work for a [employer] but has been laid off. Does woodwork for friends. Active. Wife at home and can provide supervision past discharge. Per reports that he has been smoking Cigarettes. He has been smoking about 1.00 pack per day. He does not have any smokeless tobacco history on file. He reports that he drinks about 6.0 ounces of alcohol per week. He reports that he does not use illicit drugs.  Allergies: No Known Allergies  No prescriptions prior to admission    Home:  Home Living  Lives With: Spouse;Family;Other (Comment) (wife takes care of her mother)  Available Help at Discharge: Family;Available 24 hours/day  Type of Home: House  Home Access: Stairs to enter  [employer] of Steps: 3  Entrance Stairs-Rails: None  Home Layout: One level  Bathroom Shower/Tub: Pension scheme manager: Standard  Bathroom Accessibility: Yes  How Accessible: Accessible via walker  Home Adaptive Equipment: Shower chair with back  Additional Comments: Pt is a poor historian  Functional History:  Prior Function  Able to Take Stairs?: Yes  Driving: Yes  Vocation: Full time employment  Comments: Patient not able to communicate PLOF  Functional Status:  Mobility:  Bed Mobility  Bed Mobility: Supine to Sit;Sitting - Scoot to Edge of Bed  Rolling Right: 1: +2 Total assist  Rolling Right: Patient Percentage: 70%  Right Sidelying to Sit: 1: +2 Total assist  Right Sidelying to Sit: Patient Percentage: 40%  Supine to Sit: 3: Mod assist;HOB flat  Sitting - Scoot to Edge of Bed: 3: Mod assist  Transfers  Transfers: Sit to Stand;Stand to Sit;Stand Pivot Transfers  Sit to Stand: 1: +2 Total assist  Sit to Stand: Patient Percentage: 50%  Stand to Sit: 1: +2 Total assist;With upper extremity assist;With armrests;To chair/3-in-1  Stand to Sit: Patient Percentage: 40%  Stand Pivot Transfers: 1: +2 Total  assist  Stand Pivot Transfers: Patient Percentage: 60%  Ambulation/Gait  Ambulation/Gait Assistance: 1: +2 Total assist  Ambulation/Gait: Patient Percentage: 60%  Ambulation Distance (Feet): 140 Feet  Assistive device: 2 person hand held assist  Ambulation/Gait Assistance Details: (A) for balance & safety. Pt with posterior lean but as he begins to walk he improves with ability to transition weight forwards. Narrow BOS but no scissoring noted.  Gait Pattern: Step-through pattern;Decreased stride length;Narrow base of support;Decreased hip/knee flexion - right;Decreased hip/knee flexion - left  Stairs: No  Wheelchair Mobility  Wheelchair Mobility: No  ADL:  ADL  Eating/Feeding: Other (comment)  Where Assessed - Eating/Feeding: (not assessed)  Grooming: Maximal assistance  Where Assessed - Grooming: Supported standing  Upper Body Bathing: Maximal assistance  Where Assessed - Upper Body Bathing: Supported standing  Lower Body Bathing: Maximal assistance  Where Assessed - Lower Body Bathing: Supported sit to stand  Upper Body Dressing: Maximal assistance  Where Assessed - Upper Body Dressing: Supported sitting  Lower Body Dressing: Maximal assistance  Where Assessed - Lower Body Dressing: Supported sit to Scientist, research (life sciences): +2 Total assistance  Toilet Transfer Method: Other (comment) (ambulaing)  Tub/Shower Transfer: Maximal assistance  Equipment Used: Gait belt  Transfers/Ambulation Related to ADLs: +2 HHA. TOTAL A +2. Pt @ 70%.  ADL Comments: limited by cognitive deficits and generalized weakness. Pt demonstrating motor perseveration apraxia during aDL. REquires hand over hand to terminate activity.  Cognition:  Cognition  Overall Cognitive Status: Impaired/Different from baseline  Arousal/Alertness: Awake/alert  Orientation Level: Oriented to person;Disoriented to place;Disoriented to time;Disoriented to situation;Other (comment) (trach patient)  Attention: Focused  Focused  Attention: Impaired  Focused Attention Impairment: Verbal basic  Memory: (unable to assess due to lethargy)  Awareness: Impaired  Awareness Impairment: Intellectual impairment;Emergent impairment;Anticipatory impairment  Problem Solving: Impaired  Problem Solving Impairment: Functional basic  Behaviors: Restless  Safety/Judgment: Impaired  Cognition  Arousal/Alertness: Awake/alert  Behavior During Therapy: Flat affect  Overall Cognitive Status: Impaired/Different from baseline  Area of Impairment: Orientation;Attention;Memory;Following commands;Safety/judgement;Awareness;Problem solving  Orientation Level: Disoriented to;Place;Time;Situation  Current Attention Level: Focused (easily distracted)  Memory: Decreased recall of precautions  Following Commands: Follows one step commands inconsistently  Safety/Judgement: Decreased awareness of safety;Decreased awareness of deficits  Awareness: Intellectual  Problem Solving: Slow processing;Decreased initiation;Difficulty sequencing;Requires verbal cues;Requires tactile cues  General Comments: Requires tactile cues to terminate tasks. Showing progress.  Physical Exam:  Blood pressure 124/85, pulse 85, temperature 97.7 F (36.5 C), temperature source Axillary, resp. rate 21, height 5\' 9"  (1.753 m), weight 71 kg (156 lb 8.4 oz), SpO2 95.00%.  Physical Exam  Nursing note and vitals reviewed.  Constitutional: He appears well-developed and well-nourished. No distress. Sitting at bedside HENT: oral mucosa pink and moist Head: Normocephalic and atraumatic.  Eyes: Pupils are equal, round, and reactive to light.  Neck:  Trach site with dressing in place.  Air leakage is noted when he speaks. No drainage noted Cardiovascular: Normal rate and regular rhythm.  Pulmonary/Chest: Effort normal and breath sounds normal. No wheezes rales or rhonchi Abdominal: Soft. Bowel sounds are normal. He exhibits no distension.  Neurological:   Alert and pleasant  sitting at the edge the bed.  Some perseverative behaviors noted. Oriented to self, place, month (remembered a family member's birthday was today in fact).Could not remember the year. He was able to name the President of the [place] with cueing. Distracted easily. Delayed processing with simple and more complex tasks.  UE strength 4/5. LE 3+proximally with 4/5 distally. No gross sensory deficits.  Results for orders placed during the hospital encounter of 10/30/12 (from the past 48 hour(s))   GLUCOSE, CAPILLARY Status: Abnormal    Collection Time    11/19/12 4:34 PM   Result  Value  Range    Glucose-Capillary  114 (*)  70 - 99 mg/dL    Comment 1  Notify RN    GLUCOSE, CAPILLARY Status: Abnormal    Collection Time    11/20/12 1:08 AM   Result  Value  Range    Glucose-Capillary  132 (*)  70 - 99 mg/dL   BASIC METABOLIC PANEL Status: Abnormal    Collection Time    11/20/12 5:50 AM   Result  Value  Range    Sodium  138  135 - 145 mEq/L    Potassium  3.8  3.5 - 5.1 mEq/L    Chloride  103  96 - 112 mEq/L    CO2  25  19 - 32 mEq/L    Glucose, Bld  107 (*)  70 - 99 mg/dL    BUN  14  6 - 23 mg/dL    Creatinine, Ser  4.09  0.50 - 1.35 mg/dL    Calcium  9.0  8.4 - 10.5 mg/dL    GFR calc non Af Amer  >90  >90 mL/min    GFR calc Af Amer  >90  >90 mL/min    Comment:      The eGFR has been calculated     using the CKD EPI equation.     This calculation has not been     validated in all clinical     situations.     eGFR's persistently     <90 mL/min signify     possible Chronic Kidney Disease.   GLUCOSE, CAPILLARY Status: Abnormal    Collection Time    11/20/12 7:44 AM   Result  Value  Range    Glucose-Capillary  122 (*)  70 - 99 mg/dL    Comment 1  Notify RN    GLUCOSE, CAPILLARY Status: Abnormal    Collection Time    11/20/12 4:58 PM   Result  Value  Range    Glucose-Capillary  129 (*)  70 - 99 mg/dL   GLUCOSE, CAPILLARY Status: Abnormal    Collection Time    11/21/12 12:03 AM    Result  Value  Range    Glucose-Capillary  141 (*)  70 - 99 mg/dL   GLUCOSE, CAPILLARY Status: Abnormal    Collection Time    11/21/12 8:44 AM   Result  Value  Range    Glucose-Capillary  116 (*)  70 - 99 mg/dL    Post Admission Physician Evaluation:  1. Functional deficits secondary to anoxic encephalopathy after cardiac arrest. . 2. Patient is admitted to receive collaborative, interdisciplinary care between the physiatrist, rehab nursing staff, and therapy team. 3. Patient's level of medical complexity and substantial therapy needs in context of that medical necessity cannot be provided at a lesser intensity of care such as a SNF. 4. Patient has experienced substantial functional loss from his/her baseline which was documented above under the "Functional History"  and "Functional Status" headings. Judging by the patient's diagnosis, physical exam, and functional history, the patient has potential for functional progress which will result in measurable gains while on inpatient rehab. These gains will be of substantial and practical use upon discharge in facilitating mobility and self-care at the household level. 5. Physiatrist will provide 24 hour management of medical needs as well as oversight of the therapy plan/treatment and provide guidance as appropriate regarding the interaction of the two. 6. 24 hour rehab nursing will assist with bladder management, bowel management, safety, skin/wound care, disease management, medication administration, pain management and patient education and help integrate therapy concepts, techniques,education, etc. 7. PT will assess and treat for/with: Lower extremity strength, range of motion, stamina, balance, functional mobility, safety, adaptive techniques and equipment, NMR, cognitive perceptual rx. Goals are: supervision. 8. OT will assess and treat for/with: ADL's, functional mobility, safety, upper extremity strength, adaptive techniques and equipment, NMR,  cognitive perceptual rx. Goals are: supervision. 9. SLP will assess and treat for/with: cognition, communication, swallowing. Goals are: supervision to mod I. 10. Case Management and Social Worker will assess and treat for psychological issues and discharge planning. 11. Team conference will be held weekly to assess progress toward goals and to determine barriers to discharge. 12. Patient will receive at least 3 hours of therapy per day at least 5 days per week. 13. ELOS: 2 to 2.5 weeks  14. Prognosis: excellent Medical Problem List and Plan:  1. Severe deconditioning/anoxic encephalopathy after cardiac arrest/postop VDRF  2. DVT Prophylaxis/Anticoagulation: Subcutaneous Lovenox. Monitor platelet counts and any signs of bleeding  3. Pain Management: Tylenol as needed. Monitor with increased mobility  4. Mood: Seroquel 50 mg daily. Check sleep chart  5. Neuropsych: This patient is not capable of making decisions on his own behalf.  6. Dysphagia. Dysphagia 3 nectar thick liquids. Followup speech therapy and monitor for any signs of aspiration  7. seizure disorder. Dilantin 200 mg twice a day, valproic acid 500 mg twice a day.  Monitor for any signs of seizure activity. Check drug levels this week or next. 8. Hypertension. Coreg 9.375 mg twice a day, Vasotec 2.5 mg twice a day, Aldactone 12.5 mg daily. Monitor with increased activity  9. Tobacco abuse. Counseling  10. Hyperlipidemia. Lipitor            Ranelle Oyster, MD, [facility] Physical Medicine & Rehabilitation  11/21/2012

## 2012-11-25 NOTE — Progress Notes (Signed)
PULMONARY  / CRITICAL CARE MEDICINE  Name: Walter Frazier MRN: [id number] DOB: 1955-09-20    ADMISSION DATE:  10/30/2012 CONSULTATION DATE:  10/30/2012  REFERRING MD :  Diona Browner PRIMARY SERVICE: PCCM  CHIEF COMPLAINT:  Cardiac arrest  BRIEF PATIENT DESCRIPTION: 57 y/o male smoker with no known past medical history was admitted to [facility] on 5/29 after cardiac arrest from a STEMI.  He was comatose afterwards and was started on the hypothermia protocol and transferred to [facility].  SIGNIFICANT EVENTS / STUDIES:  5/29 - LHC > DES to prox LAD @ [facility]. IABP placed 5/29 - Transfer to [facility] for hypothermia 5/30 - Echo >> EF 25%, LV severely dilated 6/02 - RN noted ?myoclonus - sedation infusions resumed. Purposeful on WUA but not F/C.  6/02 - Neuro consult: EEG > non-convulsive status epilepticus. Resolution of these discharges after ativan. Loaded with fosphenytoin.  6/02 - CT head: NAD 6/03 - 24 hr EEG:  Diffuse slowing, suggestive of general encephalopathy 6/04 - Started on Keppra 6/06 - daily progress neuro wise, severe agiation 6/07 - weaned for 1 hour on PSV, agitation limiting factor 6/09 Passed SBT, extubated, tolerated approx 3 hrs. Very agitated, tachypneic despite dexmedetomidine. Reintubated. Trach planned 6/10 Trach tube placed 6/11 Foley removed. Urinary retention. Foley replaced. Urethral discharge noted. Cloudy urine. Ceftrx started for UTI 6/13 Transfer to SDU 6/14 Following commands!!! Fent patch, clonazepam doses decreased 6/17  G tube  Deferred 6/18 Trach changed to #6 cuffless Shiley 6/18 DIII diet initiated 6/20 decannulated. Ready for Rehab   LINES / TUBES: IABP 5/29 >> 6/01 ETT 5/29 >> 6/09, 6/09 >> 6/10 L IJ CVL 5/29 >> 6/10 L radial A-line 5/29 >> 6/4 Trach 6/10 >> 6/20 PICC 6/10 >> 6/20   CULTURES: MRSA PCR 5/29 >> neg Urine 6/12 >> E coli  ANTIBIOTICS: Ceftrx 6/11 >> 6/14  SUBJ/Overnight: No distress, no complaints  Some confusion reported  PHYSICAL  EXAMINATION: Gen: wd male, nad HEENT: Poor dentition, no nasal purulence or d/c.  Neck without LN or TMG PULM:clear to auscultation, although poor inspiratory effort. CV: RRR s M AB: BS+, soft, nontender Ext: warm/dry, no edema, no cyanosis Neuro: moves all ext with full strength, + F/C, some confusion and disorientation  LABS:  Recent Labs Lab 11/24/12 0500  HGB 13.4  HCT 39.4  WBC 15.0*  PLT 554*    Recent Labs Lab 11/20/12 0550 11/21/12 1300 11/21/12 1655 11/24/12 0500  NA 138 141  --  138  K 3.8 3.9  --  4.5  CL 103 105  --  103  CO2 25 26  --  24  GLUCOSE 107* 141*  --  134*  BUN 14 14  --  14  CREATININE 0.62 0.73  --  0.72  CALCIUM 9.0 8.8  --  8.9  MG  --   --  2.2  --   PHOS  --   --  3.1  --    No results found for this basename: PHART, PCO2, PCO2ART, PO2, PO2ART, HCO3, TCO2, O2SAT,  in the last 168 hours   Recent Labs Lab 11/20/12 1658 11/21/12 0003 11/21/12 0844 11/21/12 1543 11/21/12 2109  GLUCAP 129* 141* 116* 130* 152*    CXR: no new film  ASSESSMENT / PLAN:  CARDIOVASCULAR A:  Cardiac arrest 5/29 - VT/VF Anterior wall STEMI 5/29, prox LAD occlusion - s/p DES PCI @ [facility] Cardiogenic shock, resolved.   HTN  P:  -continue enalapril, ASA, plavix, metoprolol, aldactone -lipitor -Cards  following -appreciate Dr Odessa Fleming discussions with wife today. Note that for now a LifeVest will be deferred. At some point may reconsider this (and DNR status) if his MS improves.   PULMONARY A:  Acute respiratory failure post cardiac arrest LLL Airspace Disease - atelectasis, resolved Failed extubation 6/09 Tracheostomy status >> now decannulated Comfortable on room air with no increased wob.   NEUROLOGIC A:   Post anoxic encephalopathy - much improved Status epilepticus, resolved Severe agitation - improved; still with some impulsive behavior.  Apparent aphasia, resolved   P:   -continue current valproate, dilantin -sitter in the room due to  impulsive behavior; pt denies being confused  Dispo - Probably Ready for CIR, await their input about a transfer (insurance is evaluating). OK for floor bed with a sitter  Levy Pupa, MD, PhD 11/25/2012, 11:21 AM [place] [facility] [phone] or if no answer [phone]

## 2012-11-25 NOTE — Progress Notes (Signed)
11/25/12 1941  Vitals  Temp 97.6 F (36.4 C)  Temp src Oral  BP ! 90/57 mmHg  BP Location Left arm  BP Method Automatic  Patient Position, if appropriate Lying  Pulse Rate 93  Pulse Rate Source Dinamap  Resp 18  Oxygen Therapy  SpO2 95 %  O2 Device None (Room air)   Patient was admited to room 4027 on previous shift. Admission Vital sign are  listed above. Patient is watching TV while chatting with his wife.

## 2012-11-25 NOTE — Progress Notes (Signed)
Patient Name: Walter Frazier      SUBJECTIVE: without complaints N  History reviewed. No pertinent past medical history.  Scheduled Meds:  Scheduled Meds: . aspirin  81 mg Oral Daily  . atorvastatin  80 mg Oral q1800  . carvedilol  9.375 mg Oral BID WC  . clopidogrel  75 mg Oral Q breakfast  . enalapril  2.5 mg Oral BID  . enoxaparin (LOVENOX) injection  40 mg Subcutaneous Daily  . feeding supplement  237 mL Oral TID BM  . phenytoin  200 mg Oral BID  . QUEtiapine  50 mg Oral QHS  . spironolactone  12.5 mg Oral Daily  . valproic acid  500 mg Oral BID   Continuous Infusions:   PHYSICAL EXAM Filed Vitals:   11/24/12 2040 11/24/12 2150 11/25/12 0017 11/25/12 0438  BP: 107/70 114/69 107/69 115/40  Pulse: 90  84 91  Temp:   98 F (36.7 C) 97.9 F (36.6 C)  TempSrc:   Axillary Oral  Resp: 19  22 19   Height:      Weight:    152 lb 12.5 oz (69.3 kg)  SpO2: 100%  100% 100%    Well developed and nourished in no acute distress HENT normal Neck supple with JVP-flat Clear Regular rate and rhythm, no murmurs or gallops Abd-soft with active BS No Clubbing cyanosis edema Skin-warm and dry A;ert and not oriented Grossly normal sensory and motor function  TELEMETRY: Reviewed telemetry pt in  Sinus tach:    Intake/Output Summary (Last 24 hours) at 11/25/12 0744 Last data filed at 11/24/12 2000  Gross per 24 hour  Intake   1420 ml  Output      0 ml  Net   1420 ml    LABS: Basic Metabolic Panel:  Recent Labs Lab 11/20/12 0550 11/21/12 1300 11/21/12 1655 11/24/12 0500  NA 138 141  --  138  K 3.8 3.9  --  4.5  CL 103 105  --  103  CO2 25 26  --  24  GLUCOSE 107* 141*  --  134*  BUN 14 14  --  14  CREATININE 0.62 0.73  --  0.72  CALCIUM 9.0 8.8  --  8.9  MG  --   --  2.2  --   PHOS  --   --  3.1  --    Cardiac Enzymes: No results found for this basename: CKTOTAL, CKMB, CKMBINDEX, TROPONINI,  in the last 72 hours CBC:  Recent Labs Lab 11/24/12 0500    WBC 15.0*  HGB 13.4  HCT 39.4  MCV 92.1  PLT 554*   ASSESSMENT AND PLAN:  Principal Problem:   Cardiac arrest Active Problems:   STEMI (ST elevation myocardial infarction)   V-tach   Acute respiratory failure with hypoxia   Encephalopathy acute   Status epilepticus   UTI (lower urinary tract infection)   Tracheostomy status   Acute systolic heart failure  I have called and spoken to his wife this morning. We have reviewed the role of a LifeVest as a bridge to a decision point  I.e. does he need an implantable defibrillator or not. We have discussed the fact that he would have to wear it. His wife expresses concern about his ability to not pick at it and take it off. We discussed the fact that it is a patient participation device with a propensity to inappropriate shocks. We have discussed my concern that his poor mental status is not  a sufficient reason to justify not using it. We have discussed that he currently has a DO NOT RESUSCITATE order in effect that may also be appropriately reconsidered as again his poor mental status may not be sufficient reason for the DO NOT RESUSCITATE order. I appreciate that my interaction with the family has been very brief and that other physicians involved in patient's care may have a different perspective.  Based on the above, however, Mrs. Pierre and I came to the following conclusion: That we would proceed currently without the use of a LifeVest until which time patient's mental status and his ability to participate and or simply wear the LifeVest becomes a viable option.  I have asked the patient's wife to feel free to contact us at any time in this regard. She understands the potential risks associated with not wearing a LifeVest but it is her impression that he would not be able to wear it currently given his mental status issues.  Signed, Sherryl Manges MD  11/25/2012

## 2012-11-25 NOTE — Discharge Summary (Signed)
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 5/29 started amiodarone and underwent LHC > stent to prox LAD and IABP placed.  He was intubated and comatose afterwards and was started on the hypothermia protocol. He transferred to [facility] for further care on same day.  After re-warming, noted to be myoclonic requiring further sedation. 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 He improved with ativan, Dilantin and then Keppra. Final Seizure regimen will be Valproate and Dilantin. Extubation was attempted but he failed due to agitation. He was reintubated and then underwent trach placement 6/10. He was treated for E coli UTI with ceftriaxone 6/11 - 6/14. Post trach placement his MS improved although he still showed some disorientation and confusion. Transferred to SDU on 6/13, trach downsized on 6/18. He showed progress with airway management and PMV and was decanulated on 6/20. He continued to have some impulsive behavior and confusion requiring a sitter, but he was able to be redirected and to work with Physical Therapy. He will be discharged to [facility] for further therapy. His diet is Dysphagia 3. Hopefully he will be able  to upgrade to regular diet soon.   Cardiac meds on discharge as below: enalapril, ASA, plavix, metoprolol, aldactone, atorvastatin. The issue of wearing a LifeVest and possible future AICD was discussed with the patient and his wife by Dr Graciela Husbands. At this time her decision was to defer these things. They would reconsider if his MS improves significantly.  His code status on discharge is DNR/I.   STUDIES:  5/30 - Echo >> EF 25%, LV severely dilated 6/02 - Head CT >> no acute findings or evidence for anoxia 6/23 - Echo >> LV apical and anterior wall hypokinesis, moderately dilated, LVEF 25-30%. Normal RV  LINES / TUBES: IABP 5/29 >> 6/01 ETT 5/29 >> 6/09, 6/09 >> 6/10 L IJ CVL 5/29 >> 6/10 L radial A-line 5/29 >> 6/4 Trach 6/10 >> 6/20 PICC 6/10 >> 6/20  CULTURES: MRSA PCR 5/29 >> neg Urine 6/12 >> E coli  ANTIBIOTICS: Ceftrx 6/11 >> 6/14  PHYSICAL EXAMINATION at Discharge: Filed Vitals:   11/25/12 0800 11/25/12 0854 11/25/12 0855 11/25/12 1219  BP: 101/76 101/76 101/76 105/91  Pulse: 104  101 109  Temp:    98.7 F (37.1 C)  TempSrc: Oral   Axillary  Resp: 21   18  Height:      Weight:      SpO2: 100%   100%   Gen: wd male, nad HEENT: Poor dentition, no nasal purulence or d/c.  Neck without LN or TMG PULM:clear to auscultation, although poor inspiratory effort. CV: RRR s M AB: BS+, soft, nontender Ext: warm/dry, no edema, no cyanosis Neuro: moves all ext with full strength, + F/C, some confusion and disorientation  LABS:  Recent Labs Lab 11/24/12 0500  HGB 13.4  HCT  39.4  WBC 15.0*  PLT 554*    Recent Labs Lab 11/20/12 0550 11/21/12 1300 11/21/12 1655 11/24/12 0500  NA 138 141  --  138  K 3.8 3.9  --  4.5  CL 103 105  --  103  CO2 25 26  --  24  GLUCOSE 107* 141*  --  134*  BUN 14 14  --  14  CREATININE 0.62 0.73  --  0.72  CALCIUM 9.0 8.8  --  8.9  MG  --   --  2.2  --   PHOS  --   --  3.1  --     Recent Labs Lab 11/20/12 1658 11/21/12 0003  11/21/12 0844 11/21/12 1543 11/21/12 2109  GLUCAP 129* 141* 116* 130* 152*    DISCHARGE MEDICATIONS: aspirin chewable tablet 81 mg,  atorvastatin (LIPITOR) tablet 80 mg, Oral, q1800 carvedilol (COREG) tablet 9.375 mg Oral, BID   clopidogrel (PLAVIX) tablet 75 mg, Oral, Q breakfast,  enalapril (VASOTEC) tablet 2.5 mg, Oral, BID, enoxaparin (LOVENOX) injection 40 mg, Subcutaneous, Daily,  feeding supplement (ENSURE COMPLETE) liquid 237 mL, Oral, TID  fentaNYL (SUBLIMAZE) injection 25 mcg, Intravenous, Q2H PRN,   levalbuterol (XOPENEX) nebulizer solution 0.63 mg, Nebulization, Q3H PRN, phenytoin (DILANTIN) 125 MG/5ML suspension 200 mg, Oral, BID,  QUEtiapine (SEROQUEL) tablet 50 mg, Oral, QHS RESOURCE THICKENUP CLEAR, , Oral, PRN spironolactone (ALDACTONE) tablet 12.5 mg, Oral, Daily,  valproic acid (DEPAKENE) 250 MG capsule 500 mg Oral, BID,    Levy Pupa, MD, PhD 11/25/2012, 4:29 PM [facility] [phone] or if no answer [phone]

## 2012-11-25 NOTE — Progress Notes (Signed)
Patient to be admitted to inpt rehab today. [phone]

## 2012-11-25 NOTE — Progress Notes (Signed)
Met patient and wife at bedside- patient states he is doing well and seems to be pleased with his progress and remains encouraged. CIR is considering patient- working with [employer]- we also are working towards SNF placement- will f/u with CIR Case Manager-  Reece Levy, MSW, [employer] [phone]

## 2012-11-25 NOTE — Progress Notes (Signed)
Occupational Therapy Treatment Patient Details Name: Walter Frazier MRN: [id number] DOB: [birth year] Today's Date: 11/25/2012 Time: 4098-1191 OT Time Calculation (min): 33 min  OT Assessment / Plan / Recommendation Comments on Treatment Session This 57 yo admitted to [facility] on 5/29 after cardiac arrest from a STEMI, comatose, hypoxia, started on the hypothermia protocol and transferred to [facility]. 6/10 Trach tube placed. PTA, pt was independent with ADL and mobility and worked full time. Pt has supportive wife who can provide 24/7 assistance after D/C presents to acute OT making progress showing gains today in attention to tasks. Will continue to benefit from acute OT with follow up on inpatient rehab.to get to a S level--he has so much potential with intense rehab.    Follow Up Recommendations  CIR       Equipment Recommendations  None recommended by OT    Recommendations for Other Services Rehab consult  Frequency Min 3X/week   Plan Discharge plan remains appropriate    Precautions / Restrictions Precautions Precautions: Fall Precaution Comments: pt with noted visual deficits to the Right this date. Restrictions Weight Bearing Restrictions: No       ADL  ADL Comments: Focus of session today was to look at vision and scanning. Walter Frazier was able to say his numbers 1-9 then I turned a piece of paper over that had the numbers 1-9 scattered around on it and had him then find the numbers in sequence it took him and 10 seconds with mod VCs to find them in order. Had him work on tracing a pattern from point "A" to point "B"--this was quite difficult for him.  Had him look at a sheet of paper and  find the ones the letters that were underlined, this was difficult so I highlighted the ones that were underlined and he had increased success with finding and naming the letters  (however his scanning pattern did not have a pattern).  He stayed on task with me for 30 minutes and did not show  distraction when two people opened the door on the other side of the curtain (only when they stepped around the curtain did he look up). Left some of tasks that we had worked on for his wife to work on with him as well as giving her other ideas with scanning the newspaper.  Pt making progress. Still feel like there are visual issues more related to processing and scanning that should get better with continued therapy.      OT Goals Miscellaneous OT Goals Miscellaneous OT Goal #1: Walter Frazier will be able to find and name the numbers 1-9 and letters A-I on a piece of paper in order in 1 minute or less for each task. OT Goal: Miscellaneous Goal #1 - Progress: Goal set today Miscellaneous OT Goal #2: Walter Frazier will be able to read a simple book in a left to right pattern for 4 pages in order to be able to read to his grand-daughters. OT Goal: Miscellaneous Goal #2 - Progress: Goal set today  Visit Information  Last OT Received On: 11/25/12 Assistance Needed: +1          Cognition  Cognition Arousal/Alertness: Awake/alert Behavior During Therapy: WFL for tasks assessed/performed Overall Cognitive Status: Impaired/Different from baseline Area of Impairment: Problem solving;Attention Orientation Level: Time Current Attention Level: Selective Memory:  (improved from yesterday) Following Commands: Follows one step commands with increased time Awareness: Intellectual Problem Solving: Slow processing General Comments: pt much improved from yesterday however still remains to  have impaired comprehension, delayed processing    Mobility  Bed Mobility Bed Mobility: Supine to Sit Supine to Sit: 5: Supervision;HOB flat Details for Bed Mobility Assistance: minimal tactile and verbal cues to complete task Transfers Sit to Stand: 4: Min assist;With upper extremity assist;From bed Stand to Sit: 4: Min assist;With upper extremity assist;To bed Details for Transfer Assistance: minimal verbal cues to sequencing  task          End of Session OT - End of Session Activity Tolerance: Patient tolerated treatment well Patient left:  (sitting EOB with wife doing more visual activiites with him)       Evette Georges [phone] 11/25/2012, 3:13 PM

## 2012-11-25 NOTE — Progress Notes (Addendum)
[employer] peer to peer review complete. Approved for inpt rehab. I have alerted RN CM, SW, wife , and Dr. Delton Coombes. I await official word from insurance to admit today. [phone]

## 2012-11-26 ENCOUNTER — Inpatient Hospital Stay (HOSPITAL_COMMUNITY): Payer: BC Managed Care – PPO | Admitting: Occupational Therapy

## 2012-11-26 ENCOUNTER — Inpatient Hospital Stay (HOSPITAL_COMMUNITY): Payer: BC Managed Care – PPO | Admitting: Speech Pathology

## 2012-11-26 ENCOUNTER — Inpatient Hospital Stay (HOSPITAL_COMMUNITY): Payer: BC Managed Care – PPO | Admitting: *Deleted

## 2012-11-26 DIAGNOSIS — I469 Cardiac arrest, cause unspecified: Secondary | ICD-10-CM

## 2012-11-26 DIAGNOSIS — G931 Anoxic brain damage, not elsewhere classified: Secondary | ICD-10-CM

## 2012-11-26 DIAGNOSIS — J9600 Acute respiratory failure, unspecified whether with hypoxia or hypercapnia: Secondary | ICD-10-CM

## 2012-11-26 HISTORY — DX: Anoxic brain damage, not elsewhere classified: G93.1

## 2012-11-26 LAB — COMPREHENSIVE METABOLIC PANEL
ALT: 36 U/L (ref 0–53)
AST: 29 U/L (ref 0–37)
CO2: 26 mEq/L (ref 19–32)
Chloride: 98 mEq/L (ref 96–112)
GFR calc Af Amer: >90 mL/min (ref >90)
GFR calc non Af Amer: >90 mL/min (ref >90)
Glucose, Bld: 147 mg/dL — ABNORMAL HIGH (ref 70–99)
Sodium: 135 mEq/L (ref 135–145)
Total Bilirubin: 0.3 mg/dL (ref 0.3–1.2)

## 2012-11-26 LAB — CBC WITH DIFFERENTIAL/PLATELET
Basophils Absolute: 0.1 10*3/uL (ref 0.0–0.1)
HCT: 37.5 % — ABNORMAL LOW (ref 39.0–52.0)
Lymphocytes Relative: 19 % (ref 12–46)
Lymphs Abs: 2.5 10*3/uL (ref 0.7–4.0)
MCV: 90.6 fL (ref 78.0–100.0)
Monocytes Absolute: 1 10*3/uL (ref 0.1–1.0)
Neutro Abs: 8.8 10*3/uL — ABNORMAL HIGH (ref 1.7–7.7)
Platelets: 465 10*3/uL — ABNORMAL HIGH (ref 150–400)
RBC: 4.14 MIL/uL — ABNORMAL LOW (ref 4.22–5.81)
RDW: 13.2 % (ref 11.5–15.5)
WBC: 12.7 10*3/uL — ABNORMAL HIGH (ref 4.0–10.5)

## 2012-11-26 NOTE — Evaluation (Signed)
Physical Therapy Assessment and Plan  Patient Details  Name: Walter Frazier MRN: [id number] Date of Birth: 1955/12/01  PT Diagnosis: Abnormality of gait, Cognitive deficits, Coordination disorder, Impaired cognition and Muscle weakness Rehab Potential: Good ELOS: 12-14 days   Today's Date: 11/26/2012 Time: 8413-2440 Time Calculation (min): 63 min  Problem List:  Patient Active Problem List   Diagnosis Date Noted  . Anoxic brain damage 11/26/2012  . Acute systolic heart failure 11/23/2012  . UTI (lower urinary tract infection) 11/14/2012  . 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 Status epilepticus 11/03/2012  . Cardiac arrest 10/31/2012  . STEMI (ST elevation myocardial infarction) 10/31/2012  . V-tach 10/31/2012  . Acute respiratory failure with hypoxia 10/31/2012  . Encephalopathy acute 10/31/2012    Past Medical History: No past medical history on file. Past Surgical History:  Past Surgical History  Procedure Laterality Date  . Back surgery      ~20 years ago by Dr. Jeral Fruit    Assessment & Plan Clinical Impression: Walter Frazier is a 57 y.o. male admitted with prior history of 1 ppd tobacco who had out of hospital cardiac arrest and was taken to [facility]. He was taken to cardiac catheterization lab given ST elevation on ECG in setting of cardiac arrest with findings of proximal LAD occlusion treated with atherectomy and DES to proximal LAD. Echo with EF 25% with severely dilated LV. Post procedure comatose with acute respiratory failure requiring intubation and treated with hypothermia protocol. He was noted to have posturing post warming and neurology consulted due to concerns of hypoxic injury. CT head without acute changes. EEG consistent with nonconvulsive status epilepticus and patient started dilantin and valproic acid. Bouts of agitation and restlessness with sitter provided for safety. He failed extubation requiring trach placement 11/11/12 and  patient started on vent wean later decannulated 11/21/2012. No current plan for life vest at this time as patient is DO NOT RESUSCITATE. Patient has difficulty following commands with ataxic movements and posterior lean with difficulty advancing feet. MBS done 06/18 and patient started on D3, nectar liquids. Physical and occupational therapy evaluations completed an ongoing. Patient was felt to be a good candidate for inpatient rehabilitation services and was admitted for comprehensive rehabilitation program. Patient transferred to CIR on 11/25/2012 .   Patient currently requires min with mobility secondary to muscle weakness, decreased cardiorespiratoy endurance, impaired timing and sequencing, motor apraxia and decreased coordination, ideational apraxia, decreased attention, decreased awareness, decreased problem solving, decreased safety awareness and decreased memory and decreased standing balance and decreased balance strategies.  Prior to hospitalization, patient was independent  with mobility and lived with spouse   in a House home.  Home access is 3Stairs to enter.  Patient will benefit from skilled PT intervention to maximize safe functional mobility, minimize fall risk and decrease caregiver burden for planned discharge home with 24 hour supervision.  Anticipate patient will benefit from follow up OP at discharge.  PT - End of Session Activity Tolerance: Tolerates 30+ min activity with multiple rests Endurance Deficit: Yes Endurance Deficit Description: fatigues quickly PT Assessment Rehab Potential: Good Barriers to Discharge: None PT Plan PT Intensity: Minimum of 1-2 x/day ,45 to 90 minutes PT Frequency: 5 out of 7 days PT Duration Estimated Length of Stay: 12-14 days PT Treatment/Interventions: Ambulation/gait training;Balance/vestibular training;Cognitive remediation/compensation;Community reintegration;Discharge planning;Neuromuscular re-education;Functional mobility  training;DME/adaptive equipment instruction;Disease management/prevention;Pain management;Patient/family education;Psychosocial support;Splinting/orthotics;UE/LE Coordination activities;UE/LE Strength taining/ROM;Therapeutic Exercise;Therapeutic Activities;Stair training;Visual/perceptual remediation/compensation;Wheelchair propulsion/positioning PT Recommendation Recommendations for Other Services: Speech consult Follow  Up Recommendations: Outpatient PT Patient destination: Home Equipment Recommended: None recommended by PT Equipment Details: DME assessment ongoing; Recommendations TBD upon discharge  Skilled Therapeutic Intervention Skilled therapeutic intervention initiated after completion of evaluation. Supplied 18x16 wheelchair cushion to increase patient comfort.  PT Evaluation Precautions/Restrictions Precautions Precautions: Fall Restrictions Weight Bearing Restrictions: No General Chart Reviewed: Yes Family/Caregiver Present: No  Vital SignsTherapy Vitals Pulse Rate: 94 BP: 110/72 mmHg Patient Position, if appropriate: Sitting Oxygen Therapy SpO2: 97 % O2 Device: None (Room air) Pulse Oximetry Type: Intermittent Pain Pain Assessment Pain Assessment: No/denies pain Pain Score: 0-No pain Home Living/Prior Functioning Home Living Family/patient expects to be discharged to:: Private residence Living Arrangements: Spouse/significant other;Other relatives Available Help at Discharge: Family;Available 24 hours/day;Spouse/Significant other Type of Home: House Home Access: Stairs to enter [employer] of Steps: 3 Entrance Stairs-Rails: None Home Layout: One level Home Equipment: None Additional Comments: Pt is a poor historian, difficult to fully assess home layout Prior Function Level of Independence: Independent with basic ADLs;Independent with homemaking with ambulation;Independent with gait;Independent with transfers Comments: Patient not able to  communicate PLOF Vision/Perception  Vision - History Baseline Vision: Wears glasses only for reading Patient Visual Report: No change from baseline Vision - Assessment Eye Alignment: Within Functional Limits Vision Assessment: Vision tested Praxis Praxis: Impaired Praxis Impairment Details: Initiation;Motor planning;Perseveration Praxis-Other Comments: Ideomotor apraxia, perseveration noted  Cognition Overall Cognitive Status: Impaired/Different from baseline Orientation Level: Oriented to person;Oriented to place;Oriented to situation;Disoriented to time (likely due to word finding difficulties) Awareness: Impaired Problem Solving: Impaired Safety/Judgment: Impaired Sensation Sensation Light Touch: Appears Intact Proprioception: Appears Intact Additional Comments: Difficulty with command following for sensation and proprioception testing, but is able to accurately report when asked to use hands to point to which leg is being touched or if foot/great toe is up or down vs. stating "left, right, up, down" secondary to word findiing difficulties Coordination Gross Motor Movements are Fluid and Coordinated: Yes Fine Motor Movements are Fluid and Coordinated: No (decreased speed and accuracy w/ rapid, alternating movements) Heel Shin Test: Slight dysmetria noted B sides; Difficult to assess secondary to difficulties with command following Motor  Motor Motor: Within Functional Limits  Mobility Transfers Sit to Stand: 4: Min assist;With upper extremity assist;From chair/3-in-1;With armrests Sit to Stand Details: Tactile cues for initiation;Verbal cues for precautions/safety;Visual cues/gestures for sequencing;Verbal cues for sequencing Stand to Sit: 4: Min assist;With upper extremity assist;To chair/3-in-1;With armrests Stand to Sit Details (indicate cue type and reason): Tactile cues for initiation;Verbal cues for precautions/safety;Visual cues/gestures for sequencing;Verbal cues for  sequencing Stand Pivot Transfers: 4: Min assist;With armrests Stand Pivot Transfer Details: Tactile cues for initiation;Verbal cues for precautions/safety;Visual cues/gestures for sequencing;Verbal cues for sequencing Locomotion  Ambulation Ambulation: Yes Ambulation/Gait Assistance: 4: Min assist Ambulation Distance (Feet): 180 Feet Assistive device: 1 person hand held assist;None (R HHA progressing to no AD) Ambulation/Gait Assistance Details: Verbal cues for gait pattern;Tactile cues for posture;Tactile cues for initiation;Verbal cues for precautions/safety Ambulation/Gait Assistance Details: Patient instructed in gait training 180' x1 in controlled environment, initially with R HHA, then progressing to no AD after approximately 50'. Difficulty following directional commands. Gait Gait: Yes Gait Pattern: Step-through pattern;Trunk flexed;Narrow base of support;Decreased trunk rotation Stairs / Additional Locomotion Stairs: Yes Stairs Assistance: 4: Min assist Stairs Assistance Details: Verbal cues for gait pattern;Verbal cues for sequencing;Verbal cues for technique;Verbal cues for precautions/safety Stairs Assistance Details (indicate cue type and reason): Requires verbal cues for proper placement of whole foot on step. Stair Management Technique: One  rail Left;Alternating pattern;Forwards Number of Stairs: 5 Height of Stairs: 6 Wheelchair Mobility Wheelchair Mobility: Yes Wheelchair Assistance: 4: Administrator, sports Details: Tactile cues for placement;Verbal cues for sequencing;Verbal cues for technique;Verbal cues for precautions/safety;Verbal cues for safe use of DME/AE Wheelchair Propulsion: Both upper extremities Wheelchair Parts Management: Needs assistance Distance: 30  Trunk/Postural Assessment  Cervical Assessment Cervical Assessment: Within Functional Limits Thoracic Assessment Thoracic Assessment: Within Functional Limits Lumbar Assessment Lumbar  Assessment: Within Functional Limits Postural Control Postural Control: Within Functional Limits  Balance Balance Balance Assessed: Yes Standardized Balance Assessment Standardized Balance Assessment: Berg Balance Test Berg Balance Test Sit to Stand: Able to stand  independently using hands Standing Unsupported: Able to stand 2 minutes with supervision Sitting with Back Unsupported but Feet Supported on Floor or Stool: Able to sit safely and securely 2 minutes Stand to Sit: Controls descent by using hands Transfers: Able to transfer with verbal cueing and /or supervision Standing Unsupported with Eyes Closed: Able to stand 10 seconds with supervision Standing Ubsupported with Feet Together: Able to place feet together independently and stand for 1 minute with supervision From Standing, Reach Forward with Outstretched Arm: Can reach forward >12 cm safely (5") From Standing Position, Pick up Object from Floor: Able to pick up shoe, needs supervision From Standing Position, Turn to Look Behind Over each Shoulder: Looks behind one side only/other side shows less weight shift Turn 360 Degrees: Needs close supervision or verbal cueing Standing Unsupported, Alternately Place Feet on Step/Stool: Able to stand independently and safely and complete 8 steps in 20 seconds Standing Unsupported, One Foot in Front: Able to plae foot ahead of the other independently and hold 30 seconds Standing on One Leg: Tries to lift leg/unable to hold 3 seconds but remains standing independently Total Score: 39/56, indicating patient is at significant risk for falls (>80%). Static Sitting Balance Static Sitting - Balance Support: Feet supported;No upper extremity supported Static Sitting - Level of Assistance: 5: Stand by assistance Extremity Assessment  RLE Assessment RLE Assessment: Exceptions to [facility] RLE Strength RLE Overall Strength: Deficits RLE Overall Strength Comments: Grossly 4-/5 except hip flexion  3/5 LLE Assessment LLE Assessment: Exceptions to [facility] LLE Strength LLE Overall Strength: Deficits LLE Overall Strength Comments: Grossly 4-/5 except hip flexion 3/5  FIM:  FIM - Bed/Chair Transfer Bed/Chair Transfer Assistive Devices: Arm rests Bed/Chair Transfer: 5: Supine > Sit: Supervision (verbal cues/safety issues);4: Bed > Chair or W/C: Min A (steadying Pt. > 75%) FIM - Locomotion: Wheelchair Distance: 30 Locomotion: Wheelchair: 1: Travels less than 50 ft with minimal assistance (Pt.>75%) FIM - Locomotion: Ambulation Ambulation/Gait Assistance: 4: Min assist Locomotion: Ambulation: 4: Travels 150 ft or more with minimal assistance (Pt.>75%) FIM - Locomotion: Stairs Locomotion: Building control surveyor: Hand rail - 1 Locomotion: Stairs: 2: Up and Down 4 - 11 stairs with minimal assistance (Pt.>75%)   Refer to Care Plan for Long Term Goals  Recommendations for other services: None  Discharge Criteria: Patient will be discharged from PT if patient refuses treatment 3 consecutive times without medical reason, if treatment goals not met, if there is a change in medical status, if patient makes no progress towards goals or if patient is discharged from hospital.  The above assessment, treatment plan, treatment alternatives and goals were discussed and mutually agreed upon: by patient and by family  Chipper Herb. Heriberto Stmartin, PT, DPT 11/26/2012, 11:17 AM

## 2012-11-26 NOTE — Progress Notes (Signed)
Social Work  Social Work Assessment and Plan  Patient Details  Name: Walter Frazier MRN: [id number] Date of Birth: [birth year]  Today's Date: 11/26/2012  Problem List:  Patient Active Problem List   Diagnosis Date Noted  . Anoxic brain damage 11/26/2012  . Acute systolic heart failure 11/23/2012  . UTI (lower urinary tract infection) 11/14/2012  . 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 Status epilepticus 11/03/2012  . Cardiac arrest 10/31/2012  . STEMI (ST elevation myocardial infarction) 10/31/2012  . V-tach 10/31/2012  . Acute respiratory failure with hypoxia 10/31/2012  . Encephalopathy acute 10/31/2012   Past Medical History: No past medical history on file. Past Surgical History:  Past Surgical History  Procedure Laterality Date  . Back surgery      ~20 years ago by Dr. Jeral Fruit   Social History:  reports that he has been smoking Cigarettes.  He has been smoking about 1.00 pack per day. He does not have any smokeless tobacco history on file. He reports that he drinks about 6.0 ounces of alcohol per week. He reports that he does not use illicit drugs.  Family / Support Systems Marital Status: Married How Long?: 36 yrs Patient Roles: Spouse (Lives with wife.  Wife can assist and is not working.) Spouse/Significant Other: wife, Auden Tatar @ [phone] Children: son, Barbara Cower (15) lives in [place] and daughter, Marylene Land (44) living in [place] - both working full time and son with young children. Other Supports: wife's brother, Sharene Butters @ [phone] - wife notes he "will help if I need him to" Anticipated Caregiver: Wife - Darl Pikes Ability/Limitations of Caregiver: Wife can assist.  Wife not working, however, she does provide care to her own mother who lives with them. (supervision) Caregiver Availability: 24/7 Family Dynamics: Pt and wife note all family very supportive.  Social History Preferred language: English Religion: Baptist Cultural Background: NA Education:  HS Read: Yes Write: Yes Employment Status: Employed Name of Employer: @ [employer] via a Clinical cytogeneticist of Employment:  (6 months) Return to Work Plans: TBD per MD Fish farm manager Issues: none Guardian/Conservator: none   Abuse/Neglect Physical Abuse: Denies Verbal Abuse: Denies Sexual Abuse: Denies Exploitation of patient/patient's resources: Denies Self-Neglect: Denies  Emotional Status Pt's affect, behavior adn adjustment status: Pt speaks easily with this SW and attempts to answer questions but does require assistance of wife at times to correct information.  Does not appear in any emotional distress.  Laughing easily with wife and responds postively to her when she does have to correct him..  Pt denies any h/o depression / anxiety.  will defer any formal depression screen at this time - will monitor Recent Psychosocial Issues: loss of job after employment with co x 20 years, financial strain of unemployment Pyschiatric History: none Substance Abuse History: none  Patient / Family Perceptions, Expectations & Goals Pt/Family understanding of illness & functional limitations: Pt able to report that he is in the hospital after a heart attack.  Does not identify any anoxic injury, however, when I addressed this with him he nods in agreement that it did occur.  Wife reports that they have received limited education about anoxic BI and what they might expect in terms of cognitive recovery.   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  Wife providing care to her own mother and not working outside of home. Anticipated changes in roles/activities/participation: Wife now to assume caregiver role for her husband as well. Pt/family expectations/goals:  Pt and wife pleased with physical gains so far and hope he can reach an intermittent supervision level by d/c.    Community Resources [employer]:  None Premorbid Home Care/DME Agencies: None Transportation available at discharge: yes Resource referrals recommended: Neuropsychology  Discharge Planning Living Arrangements: Spouse/significant other;Other relatives Support Systems: Spouse/significant other;Children;Other relatives Type of Residence: Private residence Insurance Resources: Media planner (specify) Herbalist) Financial Resources:  (none currently) Financial Screen Referred: No Living Expenses: Database administrator Management: Patient Do you have any problems obtaining your medications?: No Home Management: wife primarily Patient/Family Preliminary Plans: Pt plans to return home with his wife and mother-in-law with wife providing supervision Social Work Anticipated Follow Up Needs: HH/OP;Support Group Expected length of stay: 2 weeks  Clinical Impression Unfortunate gentleman here after cardiac arrest and with anoxic BI. Actually appears better cognitively than expected.  Able to answer most questions with some correction from wife.  Wife appears very supportive and prepared to provide 2/47 supervision to pt (in addition to supervision for her mother who lives with them).  Will follow for support and d/c planning.  Braden Deloach 11/26/2012, 4:19 PM

## 2012-11-26 NOTE — Progress Notes (Signed)
INITIAL NUTRITION ASSESSMENT  DOCUMENTATION CODES Per approved criteria  -Not applicable   INTERVENTION: 1. General healthful diet; continue to encourage intake of food and beverages as able. 2.  Supplements; continue Ensure Complete po TID, each supplement provides 350 kcal and 13 grams of protein.  NUTRITION DIAGNOSIS: Inadequate oral intake related to inability to eat as evidenced by NPO diet.   Monitor:  1.  Food/Beverage; pt meeting >/=90% estimated needs with tolerance. 2.  Wt/wt change; monitor trends  Reason for Assessment: VRDF  57 y.o. male  Admitting Dx: Anoxic brain damage  ASSESSMENT: Pt admitted after witness cardiac arrest with CPR and shocks. Pt now with anoxic brain damage.   Pt has been able to progress to a Dysphagia 3 diet with tolerance.  Intake improved as an inpatient, now d/c'd to rehab.  Pt intake 25-50% of meals with Ensure Complete TID.  Largest barrier to increased intake is fatigue which will likely continue with intensive rehab. Pt with 12 lbs wt change since injury, however with recent wt stability.  Remains at risk for malnutrition, however intake is currently improving with use of supplements.    Height: Ht Readings from Last 1 Encounters:  11/25/12 5\' 7"  (1.702 m)    Weight: Wt Readings from Last 1 Encounters:  11/25/12 154 lb 14.4 oz (70.262 kg)  Admission wt: 168 lbs  Ideal Body Weight: 160 lbs   % Ideal Body Weight: 105%  Wt Readings from Last 10 Encounters:  11/25/12 154 lb 14.4 oz (70.262 kg)  11/25/12 152 lb 12.5 oz (69.3 kg)    Usual Body Weight: unknown   % Usual Body Weight: ---  BMI:  Body mass index is 24.26 kg/(m^2). WNL     Estimated Nutritional Needs: Kcal: 1800-1985 Protein: 85-100 gm  Fluid: >/= 1.8 L   Skin: intact   Diet Order: Dysphagia 3, thin  EDUCATION NEEDS: -No education needs identified at this time  No intake or output data in the 24 hours ending 11/26/12 0956  Last BM:  6/24  Labs:   Recent Labs Lab 11/21/12 1300 11/21/12 1655 11/24/12 0500 11/25/12 2054 11/26/12 0829  NA 141  --  138  --  135  K 3.9  --  4.5  --  4.2  CL 105  --  103  --  98  CO2 26  --  24  --  26  BUN 14  --  14  --  12  CREATININE 0.73  --  0.72 0.71 0.80  CALCIUM 8.8  --  8.9  --  9.0  MG  --  2.2  --   --   --   PHOS  --  3.1  --   --   --   GLUCOSE 141*  --  134*  --  147*    CBG (last 3)  No results found for this basename: GLUCAP,  in the last 72 hours  Scheduled Meds: . aspirin  81 mg Oral Daily  . atorvastatin  80 mg Oral q1800  . carvedilol  9.375 mg Oral BID WC  . clopidogrel  75 mg Oral Q breakfast  . enalapril  2.5 mg Oral BID  . enoxaparin (LOVENOX) injection  40 mg Subcutaneous Q24H  . feeding supplement  237 mL Oral TID BM  . phenytoin  200 mg Oral BID  . QUEtiapine  50 mg Oral QHS  . spironolactone  12.5 mg Oral Daily  . valproic acid  500 mg Oral BID  Continuous Infusions:    No past medical history on file.  Past Surgical History  Procedure Laterality Date  . Back surgery      ~20 years ago by Dr. Jeral Fruit    Loyce Dys, MS RD LDN Clinical Inpatient Dietitian Pager: [phone] Weekend/After hours pager: [phone]

## 2012-11-26 NOTE — Evaluation (Signed)
Occupational Therapy Assessment and Plan  Patient Details  Name: Walter Frazier MRN: [id number] Date of Birth: 1956/01/13  OT Diagnosis: apraxia, ataxia, cognitive deficits, disturbance of vision and muscle weakness (generalized) Rehab Potential: Rehab Potential: Good ELOS: 12-14 days   Today's Date: 11/26/2012 Time: 4098-1191 and 1130-1200 Time Calculation (min): 55 min and 30 min  Problem List:  Patient Active Problem List   Diagnosis Date Noted  . Anoxic brain damage 11/26/2012  . Acute systolic heart failure 11/23/2012  . UTI (lower urinary tract infection) 11/14/2012  . 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 Status epilepticus 11/03/2012  . Cardiac arrest 10/31/2012  . STEMI (ST elevation myocardial infarction) 10/31/2012  . V-tach 10/31/2012  . Acute respiratory failure with hypoxia 10/31/2012  . Encephalopathy acute 10/31/2012    Past Medical History: No past medical history on file. Past Surgical History:  Past Surgical History  Procedure Laterality Date  . Back surgery      ~20 years ago by Dr. Jeral Fruit    Assessment & Plan Clinical Impression: Patient is a 57 y.o. right handed male admitted with prior history of 1 ppd tobacco who had out of hospital cardiac arrest and was taken to [facility]. He was taken to cardiac catheterization lab given ST elevation on ECG in setting of cardiac arrest with findings of proximal LAD occlusion treated with atherectomy and DES to proximal LAD. Echo with EF 25% with severely dilated LV. Post procedure comatose with acute respiratory failure requiring intubation and treated with hypothermia protocol. He was noted to have posturing post warming and neurology consulted due to concerns of hypoxic injury. CT head without acute changes. EEG consistent with nonconvulsive status epilepticus and patient started dilantin and valproic acid. Bouts of agitation and restlessness with sitter provided for safety. He failed extubation  requiring trach placement 11/11/12 and patient started on vent wean later decannulated 11/21/2012. No current plan for life vest at this time as patient is DO NOT RESUSCITATE. Patient has difficulty following commands with ataxic movements and posterior lean with difficulty advancing feet. MBS done 06/18 and patient started on D3, nectar liquids.  Patient transferred to CIR on 11/25/2012 .    Patient currently requires min with basic self-care skills secondary to muscle weakness, decreased visual motor skills, decreased motor planning and ideational apraxia and decreased initiation, decreased attention, decreased awareness, decreased problem solving, decreased safety awareness, decreased memory and delayed processing.  Prior to hospitalization, patient could complete ADLs with independent .  Patient will benefit from skilled intervention to increase independence with basic self-care skills and increase level of independence with iADL prior to discharge home with care partner.  Anticipate patient will require 24 hour supervision and no further OT follow recommended.  OT - End of Session Activity Tolerance: Tolerates 30+ min activity with multiple rests Endurance Deficit: Yes Endurance Deficit Description: fatigues quickly OT Assessment Rehab Potential: Good Barriers to Discharge: Other (comment) (None) OT Plan OT Intensity: Minimum of 1-2 x/day, 45 to 90 minutes OT Frequency: 5 out of 7 days OT Duration/Estimated Length of Stay: 12-14 days OT Treatment/Interventions: Balance/vestibular training;Cognitive remediation/compensation;Community reintegration;DME/adaptive equipment instruction;Functional mobility training;Neuromuscular re-education;Pain management;Discharge planning;Patient/family education;Psychosocial support;Self Care/advanced ADL retraining;Therapeutic Activities;Therapeutic Exercise;UE/LE Strength taining/ROM;UE/LE Coordination activities;Visual/perceptual remediation/compensation OT  Recommendation Recommendations for Other Services: Neuropsych consult Patient destination: Home Follow Up Recommendations: None Equipment Recommended: Tub/shower bench   Skilled Therapeutic Intervention 1) OT eval initiated.  ADL assessment conducted at sit to stand level at sink with mod cues for initiation, sequencing, motor  planning, perseveration, and problem solving.  Pt with difficulty with word finding this session and reports feeling "fuzzy" about details leading up to and post heart attack.  Pt with 1 LOB to Rt in standing with bathing ?true LOB or decreased attention to task.  Pt required assistance with opening containers and noted pt with perseveration with decreased ability to release spoon when attempting to also open containers.  2) Pt seen for 1:1 OT with focus on functional mobility and transfers in home environment.  Pt ambulated >150 feet with min/steady assist without AD.  Engaged in toilet transfer, tub/shower transfer, and bed mobility with mod-max cues for sequencing, safety, and location of items.  Pt reports need to toilet, unable to locate toilet in bathroom and once standing over toilet pt reaching for lever to flush and grab bars before adjusting gown to urinate.  Tub/shower transfer completed with min assist with pt stepping over ledge with use of grab bar and side of tub for stability.  Pt unable to utilize tub bench for transfer due decreased ability to follow commands secondary to apraxia.  Pt with perseveration on telling a story to this clinician though with difficulty with word finding and became frustrated as he was unable to finish story and meanwhile was unable to complete transfer back to bed.  Pt with difficulty with Lt and Rt with turning when attempting to navigate back to room, however pt able to identify room when arrived.  OT Evaluation Precautions/Restrictions  Precautions Precautions: Fall Restrictions Weight Bearing Restrictions: No General   Vital  Signs Therapy Vitals Pulse Rate: 94 BP: 110/72 mmHg Patient Position, if appropriate: Sitting Oxygen Therapy SpO2: 97 % O2 Device: None (Room air) Pulse Oximetry Type: Intermittent Pain Pain Assessment Pain Assessment: No/denies pain Pain Score: 0-No pain Home Living/Prior Functioning Home Living Family/patient expects to be discharged to:: Private residence Living Arrangements: Spouse/significant other;Other relatives Available Help at Discharge: Family;Available 24 hours/day;Spouse/Significant other Type of Home: House Home Access: Stairs to enter [employer] of Steps: 3 Entrance Stairs-Rails: None Home Layout: One level Home Equipment: None Additional Comments: Pt is a poor historian, difficult to fully assess home layout.  Pt  IADL History Current License: Yes Type of Occupation: truck driver Prior Function Level of Independence: Independent with basic ADLs;Independent with homemaking with ambulation;Independent with gait;Independent with transfers  Able to Take Stairs?: Yes Driving: Yes Comments: Patient not able to communicate PLOF ADL ADL Eating: Set up Grooming: Minimal assistance Where Assessed-Grooming: Sitting at sink Upper Body Bathing: Supervision/safety;Setup Where Assessed-Upper Body Bathing: Sitting at sink Lower Body Bathing: Minimal assistance Where Assessed-Lower Body Bathing: Sitting at sink;Standing at sink Upper Body Dressing: Unable to assess Lower Body Dressing: Moderate assistance Where Assessed-Lower Body Dressing: Sitting at sink;Standing at sink Where Assessed-Toileting: Teacher, adult education: Minimal Dentist Method: Stand pivot ADL Comments: Pt with delayed processing, difficulty with following commands, and noted motor perseveration and ideational apraxia Vision/Perception  Vision - History Baseline Vision: Wears glasses only for reading Patient Visual Report: No change from baseline Vision -  Assessment Eye Alignment: Within Functional Limits Vision Assessment: Vision tested Ocular Range of Motion: Within Functional Limits Tracking/Visual Pursuits: Decreased smoothness of horizontal tracking;Decreased smoothness of vertical tracking;Requires cues, head turns, or add eye shifts to track Saccades: Additional eye shifts occurred during testing;Additional head turns occurred during testing Perception Perception: Impaired Praxis Praxis: Impaired Praxis Impairment Details: Initiation;Motor planning;Perseveration Praxis-Other Comments: Ideomotor apraxia, perseveration noted  Cognition Overall Cognitive Status: Impaired/Different from baseline Orientation Level:  Oriented to person;Oriented to place;Oriented to situation;Disoriented to time (forgets words slow to respond at times) Memory: Impaired Awareness: Impaired Problem Solving: Impaired Safety/Judgment: Impaired Sensation Sensation Light Touch: Appears Intact Stereognosis: Not tested Hot/Cold: Not tested Proprioception: Appears Intact Additional Comments: Difficulty with command following for sensation and proprioception testing, but is able to accurately report when asked to use hands to point to which leg is being touched or if foot/great toe is up or down vs. stating "left, right, up, down" secondary to word findiing difficulties Coordination Gross Motor Movements are Fluid and Coordinated: Yes Fine Motor Movements are Fluid and Coordinated: No (decreased speed and accuracy w/ rapid, alternating movements) Heel Shin Test: Slight dysmetria noted B sides; Difficult to assess secondary to difficulties with command following Motor  Motor Motor: Within Functional Limits Mobility  Transfers Sit to Stand: 4: Min assist;With upper extremity assist;From chair/3-in-1;With armrests Sit to Stand Details: Tactile cues for initiation;Verbal cues for precautions/safety;Visual cues/gestures for sequencing;Verbal cues for  sequencing Stand to Sit: 4: Min assist;With upper extremity assist;To chair/3-in-1;With armrests Stand to Sit Details (indicate cue type and reason): Tactile cues for initiation;Verbal cues for precautions/safety;Visual cues/gestures for sequencing;Verbal cues for sequencing  Trunk/Postural Assessment  Cervical Assessment Cervical Assessment: Within Functional Limits Thoracic Assessment Thoracic Assessment: Within Functional Limits Lumbar Assessment Lumbar Assessment: Within Functional Limits Postural Control Postural Control: Within Functional Limits  Balance Balance Balance Assessed: Yes Standardized Balance Assessment Standardized Balance Assessment: Berg Balance Test Berg Balance Test Sit to Stand: Able to stand  independently using hands Standing Unsupported: Able to stand 2 minutes with supervision Sitting with Back Unsupported but Feet Supported on Floor or Stool: Able to sit safely and securely 2 minutes Stand to Sit: Controls descent by using hands Transfers: Able to transfer with verbal cueing and /or supervision Standing Unsupported with Eyes Closed: Able to stand 10 seconds with supervision Standing Ubsupported with Feet Together: Able to place feet together independently and stand for 1 minute with supervision From Standing, Reach Forward with Outstretched Arm: Can reach forward >12 cm safely (5") From Standing Position, Pick up Object from Floor: Able to pick up shoe, needs supervision From Standing Position, Turn to Look Behind Over each Shoulder: Looks behind one side only/other side shows less weight shift Turn 360 Degrees: Needs close supervision or verbal cueing Standing Unsupported, Alternately Place Feet on Step/Stool: Able to stand independently and safely and complete 8 steps in 20 seconds Standing Unsupported, One Foot in Front: Able to plae foot ahead of the other independently and hold 30 seconds Standing on One Leg: Tries to lift leg/unable to hold 3 seconds  but remains standing independently Total Score: 39 Static Sitting Balance Static Sitting - Balance Support: Feet supported;No upper extremity supported Static Sitting - Level of Assistance: 5: Stand by assistance Extremity/Trunk Assessment RUE Assessment RUE Assessment: Within Functional Limits (strength grossly 4/5) LUE Assessment LUE Assessment: Within Functional Limits (strength grossly 4/5, LUE > RUE)  FIM:  FIM - Eating Eating Activity: 5: Set-up assist for open containers FIM - Grooming Grooming Steps: Wash, rinse, dry face;Wash, rinse, dry hands;Oral care, brush teeth, clean dentures Grooming: 4: Patient completes 3 of 4 or 4 of 5 steps FIM - Bathing Bathing Steps Patient Completed: Chest;Right Arm;Left Arm;Abdomen;Front perineal area;Buttocks;Right upper leg;Left upper leg;Right lower leg (including foot);Left lower leg (including foot) Bathing: 4: Steadying assist FIM - Upper Body Dressing/Undressing Upper body dressing/undressing: 0: Wears gown/pajamas-no public clothing FIM - Lower Body Dressing/Undressing Lower body dressing/undressing steps patient completed: Don/Doff  right sock;Don/Doff left sock;Thread/unthread right underwear leg;Thread/unthread left underwear leg;Pull underwear up/down Lower body dressing/undressing: 3: Mod-Patient completed 50-74% of tasks FIM - Banker Devices: Arm rests Bed/Chair Transfer: 5: Supine > Sit: Supervision (verbal cues/safety issues);4: Bed > Chair or W/C: Min A (steadying Pt. > 75%) FIM - Tub/Shower Transfers Tub/shower Transfers: 0-Activity did not occur or was simulated   Refer to Care Plan for Long Term Goals  Recommendations for other services: Neuropsych  Discharge Criteria: Patient will be discharged from OT if patient refuses treatment 3 consecutive times without medical reason, if treatment goals not met, if there is a change in medical status, if patient makes no progress towards  goals or if patient is discharged from hospital.  The above assessment, treatment plan, treatment alternatives and goals were discussed and mutually agreed upon: by patient  Leonette Monarch 11/26/2012, 12:20 PM

## 2012-11-26 NOTE — Progress Notes (Signed)
Subjective/Complaints: Pulled out picc last night. Otherwise uneventful A 12 point review of systems has been performed and if not noted above is otherwise negative.   Objective: Vital Signs: Blood pressure 105/60, pulse 88, temperature 97.9 F (36.6 C), temperature source Oral, resp. rate 18, height 5\' 7"  (1.702 m), weight 70.262 kg (154 lb 14.4 oz), SpO2 96.00%. No results found.  Recent Labs  11/24/12 0500 11/25/12 2054  WBC 15.0* 12.4*  HGB 13.4 12.2*  HCT 39.4 35.8*  PLT 554* 388    Recent Labs  11/24/12 0500 11/25/12 2054  NA 138  --   K 4.5  --   CL 103  --   GLUCOSE 134*  --   BUN 14  --   CREATININE 0.72 0.71  CALCIUM 8.9  --    CBG (last 3)  No results found for this basename: GLUCAP,  in the last 72 hours  Wt Readings from Last 3 Encounters:  11/25/12 70.262 kg (154 lb 14.4 oz)  11/25/12 69.3 kg (152 lb 12.5 oz)    Physical Exam:  Constitutional: He appears well-developed and well-nourished. No distress. Sitting at bedside  HENT: oral mucosa pink and moist  Head: Normocephalic and atraumatic.  Eyes: Pupils are equal, round, and reactive to light.  Neck:  Trach site with dressing in place. Air leakage is noted when he speaks. No drainage noted  Cardiovascular: Normal rate and regular rhythm.  Pulmonary/Chest: Effort normal and breath sounds normal. No wheezes rales or rhonchi Abdominal: Soft. Bowel sounds are normal. He exhibits no distension.  Neurological:  Alert and a little anxious. Some perseverative behaviors noted. Oriented to self, place, month (remembered a family member's birthday was today in fact).Could not remember the year. He was able to name the President of the [place] with cueing. Distracted easily. Delayed processing with simple and more complex tasks. UE strength 4/5. LE 3+proximally with 4/5 distally. No gross sensory deficits. Skin: right arm, PICC site stable   Assessment/Plan: 1. Functional deficits secondary to anoxic  BI after cardiac arrest which require 3+ hours per day of interdisciplinary therapy in a comprehensive inpatient rehab setting. Physiatrist is providing close team supervision and 24 hour management of active medical problems listed below. Physiatrist and rehab team continue to assess barriers to discharge/monitor patient progress toward functional and medical goals. FIM:                   Comprehension Comprehension Mode: Auditory Comprehension: 5-Understands complex 90% of the time/Cues < 10% of the time  Expression Expression Mode: Verbal Expression: 5-Expresses complex 90% of the time/cues < 10% of the time        Memory Memory: 5-Recognizes or recalls 90% of the time/requires cueing < 10% of the time   LOS (Days) 1 A FACE TO FACE EVALUATION WAS PERFORMED  Linden Tagliaferro T 11/26/2012 8:22 AM

## 2012-11-26 NOTE — Plan of Care (Signed)
Overall Plan of Care [facility]) Patient Details Name: HAILE BOSLER MRN: [id number] DOB: 1955-07-19  Diagnosis:  Anoxic bi  Co-morbidities: cardiac arrest, vdrf, dysphagia  Functional Problem List  Patient demonstrates impairments in the following areas: Balance, Behavior, Bladder, Bowel, Cognition, Edema, Endurance, Medication Management, Motor, Pain, Perception, Safety and Skin Integrity  Basic ADL's: eating, grooming, bathing, dressing and toileting Advanced ADL's: simple meal preparation  Transfers:  bed mobility, bed to chair, toilet, tub/shower, car, furniture and floor Locomotion:  ambulation and stairs  Additional Impairments:  Swallowing, Communication  expression and Social Cognition   social interaction, problem solving, memory, attention and awareness  Anticipated Outcomes Item Anticipated Outcome  Eating/Swallowing  Supervision with least restrictive diet  Basic self-care  Supervision  Tolieting  Supervision  Bowel/Bladder  Cont of bowel and bladder  Transfers  S all transfers  Locomotion  S x150' without AD, 3 stairs without handrails (HRs may be installed per wife)  Communication  Min A  Cognition  Min A  Pain  Less or equal to 3  Safety  Min A  Other     Therapy Plan: PT Intensity: Minimum of 1-2 x/day ,45 to 90 minutes PT Frequency: 5 out of 7 days PT Duration Estimated Length of Stay: 12-14 days OT Intensity: Minimum of 1-2 x/day, 45 to 90 minutes OT Frequency: 5 out of 7 days OT Duration/Estimated Length of Stay: 12-14 days SLP Intensity: Minumum of 1-2 x/day, 30 to 90 minutes SLP Frequency: 5 out of 7 days SLP Duration/Estimated Length of Stay: 2 weeks    Team Interventions: Item RN PT OT SLP SW TR Other  Self Care/Advanced ADL Retraining   x      Neuromuscular Re-Education  x x      Therapeutic Activities  x x x     UE/LE Strength Training/ROM  x x      UE/LE Coordination Activities  x x      Visual/Perceptual  Remediation/Compensation   x      DME/Adaptive Equipment Instruction  x x      Therapeutic Exercise  x x      Balance/Vestibular Training  x x      Patient/Family Education x x x x     Cognitive Remediation/Compensation  x x x     Functional Mobility Training  x x      Ambulation/Gait Training  x       Stair Training  x       Wheelchair Propulsion/Positioning  x       Functional Tourist information centre manager Reintegration  x x      Dysphagia/Aspiration Printmaker    x     Speech/Language Facilitation    x     Bladder Management x        Bowel Management x        Disease Management/Prevention         Pain Management x x x      Medication Management x        Skin Care/Wound Management x        Splinting/Orthotics         Discharge Planning x x x x     Psychosocial Support x x x x                            Team Discharge Planning: Destination: PT-Home ,OT- Home ,  SLP-Home Projected Follow-up: PT-Outpatient PT, OT-  None, SLP-Outpatient SLP Projected Equipment Needs: PT-None recommended by PT, OT- Tub/shower bench, SLP-None recommended by SLP Patient/family involved in discharge planning: PT- Patient,  OT-Patient, SLP-Family member/caregiver  MD ELOS: 2 weeks Medical Rehab Prognosis:  Excellent Assessment: The patient has been admitted for CIR therapies. The team will be addressing, functional mobility, strength, stamina, balance, safety, adaptive techniques/equipment, self-care, bowel and bladder mgt, patient and caregiver education, NMR, cognitve perceptual rx, swallowing, behavioral rx. Goals have been set at supervision to minimal assist.    Ranelle Oyster, MD, [facility]      See Team Conference Notes for weekly updates to the plan of care

## 2012-11-26 NOTE — Progress Notes (Signed)
At approximately 0600 went  To draw labs from PICC line, noted no PICC  In place. Found PICC intact with dressing and statlock lying on bedside table. PICC intact at 42cm and gauze dsg applied. Primary nurse notified. Colbert Ewing RN VAST.

## 2012-11-26 NOTE — Evaluation (Signed)
Speech Language Pathology Assessment and Plan  Patient Details  Name: Walter Frazier MRN: [id number] Date of Birth: [birth year]  SLP Diagnosis: Cognitive Impairments;Aphasia;Dysphagia;Voice disorder  Rehab Potential: Excellent ELOS: 2 weeks   Today's Date: 11/26/2012 Time: 1300-1400 Time Calculation (min): 60 min  Skilled Therapeutic Intervention: Administered cognitive-linguistic evaluation and BSE. Please see below for details.   Problem List:  Patient Active Problem List   Diagnosis Date Noted  . Anoxic brain damage 11/26/2012  . Acute systolic heart failure 11/23/2012  . UTI (lower urinary tract infection) 11/14/2012  . 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 Status epilepticus 11/03/2012  . Cardiac arrest 10/31/2012  . STEMI (ST elevation myocardial infarction) 10/31/2012  . V-tach 10/31/2012  . Acute respiratory failure with hypoxia 10/31/2012  . Encephalopathy acute 10/31/2012   Past Medical History: No past medical history on file. Past Surgical History:  Past Surgical History  Procedure Laterality Date  . Back surgery      ~20 years ago by Dr. Jeral Fruit    Assessment / Plan / Recommendation Clinical Impression  Pt is a 57 y.o. male admitted with prior history of 1 ppd tobacco who had out of hospital cardiac arrest and was taken to [facility] [place]. He was taken to cardiac catheterization lab given ST elevation on ECG in setting of cardiac arrest with findings of proximal LAD occlusion treated with atherectomy and DES to proximal LAD. Echo with EF 25% with severely dilated LV. Post procedure comatose with acute respiratory failure requiring intubation and treated with hypothermia protocol. He was noted to have posturing post warming and neurology consulted due to concerns of hypoxic injury. CT head without acute changes. EEG consistent with nonconvulsive status epilepticus and patient started dilantin and valproic acid. Bouts of agitation and restlessness with  sitter provided for safety. He failed extubation requiring trach placement 11/11/12 and patient started on vent wean later decannulated 11/21/2012. No current plan for life vest at this time as patient is DO NOT RESUSCITATE. Patient has difficulty following commands with ataxic movements and posterior lean with difficulty advancing feet. MBS done 06/18 and patient started on Dys. 3 textures with nectar-thick liquids. Physical and occupational therapy evaluations completed an ongoing. Patient was felt to be a good candidate for inpatient rehabilitation services and was admitted for comprehensive rehabilitation program. Patient transferred to CIR on 11/25/2012 and presents with mild oropharyngeal dysphagia per MBS on 11/19/12. Oral phase is impacted by pt's impaired cognitive function and decreased overall attention. Pt also presents with moderate-severe cognitive impairments characterized by impaired sustained attention, problem solving, safety awareness, intellectual awareness, working Printmaker. Pt also presents with mild-moderate word-finding deficits. Pt would benefit from skilled SLP intervention to maximize cognitive recovery, verbal expression and swallowing function with least restrictive diet. Anticipate pt will need 24 hour supervision and f/u outpatient services.     SLP Assessment  Patient will need skilled Speech Lanaguage Pathology Services during CIR admission    Recommendations  Diet Recommendations: Dysphagia 3 (Mechanical Soft);Nectar-thick liquid Liquid Administration via: Cup;No straw Medication Administration: Whole meds with puree Supervision: Patient able to self feed;Full supervision/cueing for compensatory strategies Compensations: Slow rate;Small sips/bites Postural Changes and/or Swallow Maneuvers: Seated upright 90 degrees Oral Care Recommendations: Oral care BID Patient destination: Home Follow up Recommendations: Outpatient SLP Equipment Recommended: None  recommended by SLP    SLP Frequency 5 out of 7 days   SLP Treatment/Interventions Cognitive remediation/compensation;Cueing hierarchy;Dysphagia/aspiration precaution training;Functional tasks;Internal/external aids;Environmental controls;Speech/Language facilitation;Therapeutic Activities;Patient/family education    Pain No/Denies  Pain  Short Term Goals: Week 1: SLP Short Term Goal 1 (Week 1): Pt will utilize swallowing compensatory strategies with supervision verbal cues to minimize overt s/s of aspiration. SLP Short Term Goal 2 (Week 1): Pt will consume trials of thin liquids without overt s/s of aspiration.  SLP Short Term Goal 3 (Week 1): Pt will utilize external memory aids to recall new, daily information with Mod A verbal and question cues.  SLP Short Term Goal 4 (Week 1): Pt will utilize call bell to express wants/needs with Mod A verbal and question cues.  SLP Short Term Goal 5 (Week 1): Pt will demonstrate functional problem solving with basic and familiar tasks with Mod A verbal cues.  SLP Short Term Goal 6 (Week 1): Pt will demonstrate sustained attention to functional tasks for 10 minutes with Mod A verbal cues for redirection.   See FIM for current functional status Refer to Care Plan for Long Term Goals  Recommendations for other services: Neuropsych  Discharge Criteria: Patient will be discharged from SLP if patient refuses treatment 3 consecutive times without medical reason, if treatment goals not met, if there is a change in medical status, if patient makes no progress towards goals or if patient is discharged from hospital.  The above assessment, treatment plan, treatment alternatives and goals were discussed and mutually agreed upon: by patient and by family  Evren Shankland 11/26/2012, 3:32 PM

## 2012-11-26 NOTE — Progress Notes (Signed)
Patient information reviewed and entered into eRehab system by Alee Katen, RN, CRRN, PPS Coordinator.  Information including medical coding and functional independence measure will be reviewed and updated through discharge.    

## 2012-11-27 ENCOUNTER — Inpatient Hospital Stay (HOSPITAL_COMMUNITY): Payer: BC Managed Care – PPO

## 2012-11-27 ENCOUNTER — Inpatient Hospital Stay (HOSPITAL_COMMUNITY): Payer: BC Managed Care – PPO | Admitting: Physical Therapy

## 2012-11-27 ENCOUNTER — Inpatient Hospital Stay (HOSPITAL_COMMUNITY): Payer: BC Managed Care – PPO | Admitting: Occupational Therapy

## 2012-11-27 ENCOUNTER — Inpatient Hospital Stay (HOSPITAL_COMMUNITY): Payer: BC Managed Care – PPO | Admitting: Speech Pathology

## 2012-11-27 MED ORDER — QUETIAPINE FUMARATE 100 MG PO TABS
100.0000 mg | ORAL_TABLET | Freq: Every day | ORAL | Status: DC
  Administered 2012-11-27: 100 mg via ORAL
  Filled 2012-11-27 (×2): qty 1

## 2012-11-27 NOTE — Progress Notes (Signed)
Subjective/Complaints: Up most of the night. At the nurses station when i arrived this am. A 12 point review of systems has been performed and if not noted above is otherwise negative.   Objective: Vital Signs: Blood pressure 95/60, pulse 81, temperature 97.9 F (36.6 C), temperature source Oral, resp. rate 18, height 5\' 7"  (1.702 m), weight 70.5 kg (155 lb 6.8 oz), SpO2 98.00%. No results found.  Recent Labs  11/25/12 2054 11/26/12 0829  WBC 12.4* 12.7*  HGB 12.2* 13.1  HCT 35.8* 37.5*  PLT 388 465*    Recent Labs  11/25/12 2054 11/26/12 0829  NA  --  135  K  --  4.2  CL  --  98  GLUCOSE  --  147*  BUN  --  12  CREATININE 0.71 0.80  CALCIUM  --  9.0   CBG (last 3)  No results found for this basename: GLUCAP,  in the last 72 hours  Wt Readings from Last 3 Encounters:  11/27/12 70.5 kg (155 lb 6.8 oz)  11/25/12 69.3 kg (152 lb 12.5 oz)    Physical Exam:  Constitutional: He appears well-developed and well-nourished. No distress. Sitting at bedside  HENT: oral mucosa pink and moist  Head: Normocephalic and atraumatic.  Eyes: Pupils are equal, round, and reactive to light.  Neck:  Trach site with dressing in place. Air leakage is noted when he speaks. No drainage noted  Cardiovascular: Normal rate and regular rhythm.  Pulmonary/Chest: Effort normal and breath sounds normal. No wheezes rales or rhonchi Abdominal: Soft. Bowel sounds are normal. He exhibits no distension.  Neurological:  Alert, non-agitated.   Oriented to self, place with cueing. Knew why he was here without cueing!  Distracted easily. Delayed processing with simple and more complex tasks. UE strength 4/5. LE 3+proximally with 4/5 distally. No gross sensory deficits. Skin: skin intact   Assessment/Plan: 1. 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 Physiatrist is providing  close team supervision and 24 hour management of active medical problems listed below. Physiatrist and rehab team continue to assess barriers to discharge/monitor patient progress toward functional and medical goals. FIM: FIM - Bathing Bathing Steps Patient Completed: Chest;Right Arm;Left Arm;Abdomen;Front perineal area;Buttocks;Right upper leg;Left upper leg;Right lower leg (including foot);Left lower leg (including foot) Bathing: 4: Steadying assist  FIM - Upper Body Dressing/Undressing Upper body dressing/undressing: 0: Wears gown/pajamas-no public clothing FIM - Lower Body Dressing/Undressing Lower body dressing/undressing steps patient completed: Don/Doff right sock;Don/Doff left sock;Thread/unthread right underwear leg;Thread/unthread left underwear leg;Pull underwear up/down Lower body dressing/undressing: 3: Mod-Patient completed 50-74% of tasks        FIM - Banker Devices: Arm rests Bed/Chair Transfer: 5: Supine > Sit: Supervision (verbal cues/safety issues);4: Bed > Chair or W/C: Min A (steadying Pt. > 75%)  FIM - Locomotion: Wheelchair Distance: 30 Locomotion: Wheelchair: 1: Travels less than 50 ft with minimal assistance (Pt.>75%) FIM - Locomotion: Ambulation Ambulation/Gait Assistance: 4: Min assist Locomotion: Ambulation: 4: Travels 150 ft or more with minimal assistance (Pt.>75%)  Comprehension Comprehension Mode: Auditory Comprehension: 3-Understands basic 50 - 74% of the time/requires cueing 25 - 50%  of the time  Expression Expression Mode: Verbal Expression: 3-Expresses basic 50 - 74% of the time/requires cueing 25 - 50% of the time. Needs to repeat parts of sentences.  Social Interaction Social Interaction: 3-Interacts appropriately 50 - 74% of the time - May  be physically or verbally inappropriate.  Problem Solving Problem Solving: 2-Solves basic 25 - 49% of the time - needs direction more than half the time to  initiate, plan or complete simple activities  Memory Memory: 2-Recognizes or recalls 25 - 49% of the time/requires cueing 51 - 75% of the time  Medical Problem List and Plan:  1. Severe deconditioning/anoxic encephalopathy after cardiac arrest/postop VDRF  2. DVT Prophylaxis/Anticoagulation: Subcutaneous Lovenox. Monitor platelet counts and any signs of bleeding  3. Pain Management: Tylenol as needed. Monitor with increased mobility  4. Mood: continue sleep chart  -increase seroquel to 100mg  qhs 5. Neuropsych: This patient is not capable of making decisions on his own behalf.  6. Dysphagia. Dysphagia 3 nectar thick liquids. Followup speech therapy and monitor for any signs of aspiration  7. seizure disorder. Dilantin 200 mg twice a day, valproic acid 500 mg twice a day.  Monitor for any signs of seizure activity. Check drug levels tomorrow morning 8. Hypertension. Coreg 9.375 mg twice a day, Vasotec 2.5 mg twice a day, Aldactone 12.5 mg daily. Monitor with increased activity  9. Tobacco abuse. Counseling  10. Hyperlipidemia. Lipitor    LOS (Days) 2 A FACE TO FACE EVALUATION WAS PERFORMED  SWARTZ,ZACHARY T 11/27/2012 8:26 AM

## 2012-11-27 NOTE — Progress Notes (Signed)
[facility]patient [facility] Individual Statement of Services  Patient Name:  JASMOND RIVER  Date:  11/27/2012  Welcome to the [facility]patient [facility].  Our goal is to provide you with an individualized program based on your diagnosis and situation, designed to meet your specific needs.  With this comprehensive rehabilitation program, you will be expected to participate in at least 3 hours of rehabilitation therapies Monday-Friday, with modified therapy programming on the weekends.  Your rehabilitation program will include the following services:  Physical Therapy (PT), Occupational Therapy (OT), Speech Therapy (ST), 24 hour per day rehabilitation nursing, Therapeutic Recreaction (TR), Neuropsychology, Case Management (Social Worker), Rehabilitation Medicine, Nutrition Services and Pharmacy Services  Weekly team conferences will be held on Tuesdays to discuss your progress.  Your Social Worker will talk with you frequently to get your input and to update you on team discussions.  Team conferences with you and your family in attendance may also be held.  Expected length of stay: 2 weeks  Overall anticipated outcome:  supervision  Depending on your progress and recovery, your program may change. Your Social Worker will coordinate services and will keep you informed of any changes. Your Social Worker's name and contact numbers are listed  below.  The following services may also be recommended but are not provided by the Inpatient Rehabilitation Center:   Driving Evaluations  Home Health Rehabiltiation Services  Outpatient Rehabilitatation [facility]  Vocational Rehabilitation   Arrangements will be made to provide these services after discharge if needed.  Arrangements include referral to agencies that provide these services.  Your insurance has been verified to be:  BCBS  Your [facility]  Pertinent information will be shared with your doctor and your  insurance company.  Social Worker:  [place], [place] [phone] or ([phone]  Information discussed with and copy given to patient by: Amada Jupiter, 11/27/2012, 9:42 AM

## 2012-11-27 NOTE — Progress Notes (Signed)
Occupational Therapy Session Note  Patient Details  Name: Walter Frazier MRN: [id number] Date of Birth: 01-17-1956  Today's Date: 11/27/2012 Time: 0830-0930 Time Calculation (min): 60 min  Short Term Goals: Week 1:  OT Short Term Goal 1 (Week 1): Pt will pick out clothing with extra time and min cues OT Short Term Goal 2 (Week 1): Pt will complete shower transfer with supervision and min cues OT Short Term Goal 3 (Week 1): Pt will complete UB dressing with supervision and min cues OT Short Term Goal 4 (Week 1): Pt will complete LB dressing with min assist and min cues OT Short Term Goal 5 (Week 1): Pt will complete toilet transfer with supervision and min cues  Skilled Therapeutic Interventions/Progress Updates:    1:1 self care retraining at shower level today with focus on task organization, intellectual awareness, sequencing, attention to task at hand, simple problem solving, recognition of errors in dressing with verbal cues and then being able to correct them, orientation. Pt with non-goal directed functional ambulation requiring cuing to problem solve task to perform.  Therapy Documentation Precautions:  Precautions Precautions: Fall Precaution Comments: visual deficits to the right Restrictions Weight Bearing Restrictions: No Pain: Pain Assessment Pain Assessment: No/denies pain  See FIM for current functional status  Therapy/Group: Individual Therapy  Roney Mans [facility] 11/27/2012, 5:07 PM

## 2012-11-27 NOTE — Progress Notes (Signed)
Physical Therapy Session Note  Patient Details  Name: Walter Frazier MRN: [id number] Date of Birth: 08/04/1955  Today's Date: 11/27/2012 Time: 1003-1059 Time Calculation (min): 56 min  Short Term Goals: Week 1:  PT Short Term Goal 1 (Week 1): STGs=LTGs  Skilled Therapeutic Interventions/Progress Updates:   This session focused on WC mobility with bil legs supervision x 70'.  Gait without assistive device > 200' min assist occationally due to LOB while turning or multitasking.   Obstacle course navigation with min assist due to perceptual issues (depth perception?) with difficulty with foot placement and clearing of foot over higher step-over obstacles.  Bed mobility in ADL apartment supervision for safety due to close to EOB.  Worked in Surveyor, mining on multi tasking during gait and functional cognition (setting the table, how many forks/spoons are needed).  Stairs bil rails min assist due to difficulty with foot placment.  Encouraged step to gait pattern to be safe.  Car transfer supervision.  Cues for safety.  Talked through why it would not be good for him to drive right now working on self awareness.  Path finding around the unit trying to use unit signs and saff assist to find his room.  Max verbal cues for interpretation of signs.     Therapy Documentation Precautions:  Precautions Precautions: Fall Precaution Comments: visual deficits to the right Restrictions Weight Bearing Restrictions: No   Vital Signs: Therapy Vitals Temp: 98.4 F (36.9 C) Temp src: Oral Pulse Rate: 86 Resp: 18 BP: 112/77 mmHg Patient Position, if appropriate: Lying Oxygen Therapy SpO2: 99 % Pain: Pain Assessment Pain Assessment: No/denies pain   Locomotion : Ambulation Ambulation/Gait Assistance: 4: Min guard Wheelchair Mobility Distance: 70   See FIM for current functional status  Therapy/Group: Individual Therapy  Lurena Joiner B. Storm Sovine, PT, DPT [phone]   11/27/2012, 4:51 PM

## 2012-11-27 NOTE — Progress Notes (Signed)
Speech Language Pathology Daily Session Note  Patient Details  Name: Walter Frazier MRN: [id number] Date of Birth: 11-Jan-1956  Today's Date: 11/27/2012 Time: 4098-1191 Time Calculation (min): 45 min  Short Term Goals: Week 1: SLP Short Term Goal 1 (Week 1): Pt will utilize swallowing compensatory strategies with supervision verbal cues to minimize overt s/s of aspiration. SLP Short Term Goal 2 (Week 1): Pt will consume trials of thin liquids without overt s/s of aspiration.  SLP Short Term Goal 3 (Week 1): Pt will utilize external memory aids to recall new, daily information with Mod A verbal and question cues.  SLP Short Term Goal 4 (Week 1): Pt will utilize call bell to express wants/needs with Mod A verbal and question cues.  SLP Short Term Goal 5 (Week 1): Pt will demonstrate functional problem solving with basic and familiar tasks with Mod A verbal cues.  SLP Short Term Goal 6 (Week 1): Pt will demonstrate sustained attention to functional tasks for 10 minutes with Mod A verbal cues for redirection.   Skilled Therapeutic Interventions: Treatment focus on cognitive goals. SLP facilitated session by providing Max A verbal, visual and question cues for functional problem solving/sequencing with 4 step picture cards. Pt also required Max verbal cues for naming/description of picture cards, pt utilized broad and empty language at times (ex. "she is doing this") throughout the task. Pt also required Max question and visual cues to locate room at end of session.  Pt also consumed trials of thin liquids and demonstrated immediate throat clear with 50% of trials. Recommend to continue nectar-thick liquids.    FIM:  Comprehension Comprehension Mode: Auditory Comprehension: 3-Understands basic 50 - 74% of the time/requires cueing 25 - 50%  of the time Expression Expression Mode: Verbal Expression: 3-Expresses basic 50 - 74% of the time/requires cueing 25 - 50% of the time. Needs to repeat  parts of sentences. Social Interaction Social Interaction: 3-Interacts appropriately 50 - 74% of the time - May be physically or verbally inappropriate. Problem Solving Problem Solving: 2-Solves basic 25 - 49% of the time - needs direction more than half the time to initiate, plan or complete simple activities Memory Memory: 2-Recognizes or recalls 25 - 49% of the time/requires cueing 51 - 75% of the time  Pain Pain Assessment Pain Assessment: No/denies pain  Therapy/Group: Individual Therapy  Alieah Brinton 11/27/2012, 2:25 PM

## 2012-11-27 NOTE — Progress Notes (Addendum)
Physical Therapy Session Note  Patient Details  Name: Walter Frazier MRN: [id number] Date of Birth: 07/22/1955  Today's Date: 11/27/2012 Time: 1450-1520 Time Calculation (min): 30 min  Short Term Goals: Week 1:  PT Short Term Goal 1 (Week 1): STGs=LTGs     Skilled Therapeutic Interventions/Progress Updates: Treatment focused on gait in controlled setting on level tile during cognitive challenges, toilet transfer, activity tolerance.  Pt stated he needed to use toilet; sit> stand with min guard assist; total assist for w/c brakes as pt perseverated on locking/unlocking brakes continuously.    Gait to toilet and transfer with close supervision/min guard assist to stand to urinate.  Gait without AD x 150' without LOB, during cognitive/visual task of scanning for and retrieving bright orange cones placed around the room.  Pt required mod cues to accurately count cones, and compute # he still needed to find.  Pt consistently missed seeing cones at head height or higher, and required max cues to scan to R.  Retrieved 10 cones from floor with safe technique, min guard assist, without LOB.    W/c mobility x 50' x 2 with min assist for steering for turns, using bil LEs; mod cues for route finding.    Therapy Documentation Precautions:  Precautions Precautions: Fall Precaution Comments: pt with noted visual deficits to the Right this date. Restrictions Weight Bearing Restrictions: No   Pain: Pain Assessment Pain Assessment: No/denies pain   Locomotion : Ambulation Ambulation/Gait Assistance: 4: Min guard Wheelchair Mobility Distance: 50       See FIM for current functional status  Therapy/Group: Individual Therapy  Sharilynn Cassity 11/27/2012, 3:40 PM

## 2012-11-28 ENCOUNTER — Inpatient Hospital Stay (HOSPITAL_COMMUNITY): Payer: BC Managed Care – PPO | Admitting: *Deleted

## 2012-11-28 ENCOUNTER — Inpatient Hospital Stay (HOSPITAL_COMMUNITY): Payer: BC Managed Care – PPO

## 2012-11-28 ENCOUNTER — Inpatient Hospital Stay (HOSPITAL_COMMUNITY): Payer: BC Managed Care – PPO | Admitting: Physical Therapy

## 2012-11-28 MED ORDER — PHENYTOIN 125 MG/5ML PO SUSP
200.0000 mg | Freq: Three times a day (TID) | ORAL | Status: DC
  Administered 2012-11-28 – 2012-12-02 (×13): 200 mg via ORAL
  Filled 2012-11-28 (×16): qty 8

## 2012-11-28 MED ORDER — VALPROIC ACID 250 MG PO CAPS
750.0000 mg | ORAL_CAPSULE | Freq: Two times a day (BID) | ORAL | Status: DC
  Administered 2012-11-28 – 2012-12-04 (×12): 750 mg via ORAL
  Filled 2012-11-28 (×14): qty 3

## 2012-11-28 MED ORDER — QUETIAPINE FUMARATE 50 MG PO TABS
150.0000 mg | ORAL_TABLET | Freq: Every day | ORAL | Status: DC
  Administered 2012-11-28 – 2012-12-03 (×6): 150 mg via ORAL
  Filled 2012-11-28 (×7): qty 1

## 2012-11-28 NOTE — Progress Notes (Addendum)
Subjective/Complaints: Problems sleeping again last night. Agitated at times. Calm this am. Sitting awake in bed. A 12 point review of systems has been performed and if not noted above is otherwise negative.   Objective: Vital Signs: Blood pressure 100/65, pulse 76, temperature 97.8 F (36.6 C), temperature source Oral, resp. rate 19, height 5\' 7"  (1.702 m), weight 70.5 kg (155 lb 6.8 oz), SpO2 100.00%. No results found.  Recent Labs  11/25/12 2054 11/26/12 0829  WBC 12.4* 12.7*  HGB 12.2* 13.1  HCT 35.8* 37.5*  PLT 388 465*    Recent Labs  11/25/12 2054 11/26/12 0829  NA  --  135  K  --  4.2  CL  --  98  GLUCOSE  --  147*  BUN  --  12  CREATININE 0.71 0.80  CALCIUM  --  9.0   CBG (last 3)  No results found for this basename: GLUCAP,  in the last 72 hours  Wt Readings from Last 3 Encounters:  11/27/12 70.5 kg (155 lb 6.8 oz)  11/25/12 69.3 kg (152 lb 12.5 oz)    Physical Exam:  Constitutional: He appears well-developed and well-nourished. No distress. Sitting at bedside  HENT: oral mucosa pink and moist  Head: Normocephalic and atraumatic.  Eyes: Pupils are equal, round, and reactive to light.  Neck:  Trach site with dressing in place. Air leakage is noted when he speaks. No drainage noted  Cardiovascular: Normal rate and regular rhythm.  Pulmonary/Chest: Effort normal and breath sounds normal. No wheezes rales or rhonchi Abdominal: Soft. Bowel sounds are normal. He exhibits no distension.  Neurological:  Alert, non-agitated.   Oriented to self, place with cueing. Knew why he was here without cueing!  Distracted easily. Delayed processing with simple and more complex tasks. UE strength 4/5. LE 3+proximally with 4/5 distally. No gross sensory deficits. Skin: skin intact   Assessment/Plan: 1. Functional deficits secondary to anoxic BI after cardiac arrest which require 3+ hours per day of interdisciplinary therapy in a comprehensive inpatient rehab  setting. Physiatrist is providing close team supervision and 24 hour management of active medical problems listed below. Physiatrist and rehab team continue to assess barriers to discharge/monitor patient progress toward functional and medical goals. FIM: FIM - Bathing Bathing Steps Patient Completed: Chest;Right Arm;Left Arm;Abdomen;Front perineal area;Buttocks;Right upper leg;Left upper leg;Right lower leg (including foot);Left lower leg (including foot) Bathing: 4: Min-Patient completes 8-9 31f 10 parts or 75+ percent  FIM - Upper Body Dressing/Undressing Upper body dressing/undressing steps patient completed: Thread/unthread right sleeve of pullover shirt/dresss;Thread/unthread left sleeve of pullover shirt/dress;Put head through opening of pull over shirt/dress;Pull shirt over trunk Upper body dressing/undressing: 5: Set-up assist to: Obtain clothing/put away FIM - Lower Body Dressing/Undressing Lower body dressing/undressing steps patient completed: Thread/unthread right underwear leg;Thread/unthread left underwear leg;Pull underwear up/down;Thread/unthread right pants leg;Thread/unthread left pants leg;Pull pants up/down;Don/Doff right sock;Don/Doff left sock;Don/Doff left shoe;Fasten/unfasten left shoe;Don/Doff right shoe;Fasten/unfasten right shoe Lower body dressing/undressing: 4: Min-Patient completed 75 plus % of tasks  FIM - Toileting Toileting steps completed by patient: Adjust clothing prior to toileting;Adjust clothing after toileting Toileting: 3: Mod-Patient completed 2 of 3 steps  FIM - Toilet Transfers Toilet Transfers: 5-To toilet/BSC: Supervision (verbal cues/safety issues);5-From toilet/BSC: Supervision (verbal cues/safety issues)  FIM - Press photographer Assistive Devices: Arm rests Bed/Chair Transfer: 5: Sit > Supine: Supervision (verbal cues/safety issues);5: Supine > Sit: Supervision (verbal cues/safety issues);5: Bed > Chair or W/C: Supervision  (verbal cues/safety issues);5: Chair or W/C > Bed: Supervision (  verbal cues/safety issues)  FIM - Locomotion: Wheelchair Distance: 70 Locomotion: Wheelchair: 2: Travels 50 - 149 ft with supervision, cueing or coaxing FIM - Locomotion: Ambulation Ambulation/Gait Assistance: 4: Min guard Locomotion: Ambulation: 4: Travels 150 ft or more with minimal assistance (Pt.>75%)  Comprehension Comprehension Mode: Auditory Comprehension: 3-Understands basic 50 - 74% of the time/requires cueing 25 - 50%  of the time  Expression Expression Mode: Verbal Expression: 3-Expresses basic 50 - 74% of the time/requires cueing 25 - 50% of the time. Needs to repeat parts of sentences.  Social Interaction Social Interaction: 3-Interacts appropriately 50 - 74% of the time - May be physically or verbally inappropriate.  Problem Solving Problem Solving: 2-Solves basic 25 - 49% of the time - needs direction more than half the time to initiate, plan or complete simple activities  Memory Memory: 2-Recognizes or recalls 25 - 49% of the time/requires cueing 51 - 75% of the time  Medical Problem List and Plan:  1. Severe deconditioning/anoxic encephalopathy after cardiac arrest/postop VDRF  2. DVT Prophylaxis/Anticoagulation: Subcutaneous Lovenox. Monitor platelet counts and any signs of bleeding  3. Pain Management: Tylenol as needed. Monitor with increased mobility  4. Mood: continue sleep chart  -increase seroquel to 150mg  qhs and schedule earlier 5. Neuropsych: This patient is not capable of making decisions on his own behalf.  6. Dysphagia. Dysphagia 3 nectar thick liquids. Followup speech therapy and monitor for any signs of aspiration  7. seizure disorder. Dilantin 200 mg twice a day, valproic acid 500 mg twice a day.  Monitor for any signs of seizure activity. Drug levels remain low. Increase dosing and recheck next week.  8. Hypertension. Coreg 9.375 mg twice a day, Vasotec 2.5 mg twice a day, Aldactone  12.5 mg daily. Monitor with increased activity  9. Tobacco abuse. Counseling  10. Hyperlipidemia. Lipitor    LOS (Days) 3 A FACE TO FACE EVALUATION WAS PERFORMED  Bleu Minerd T 11/28/2012 8:33 AM

## 2012-11-28 NOTE — Progress Notes (Signed)
Speech Language Pathology Daily Session Note  Patient Details  Name: Walter Frazier MRN: [id number] Date of Birth: 1956/03/14  Today's Date: 11/28/2012 Time: 1305-1350 Time Calculation (min): 45 min  Short Term Goals: Week 1: SLP Short Term Goal 1 (Week 1): Pt will utilize swallowing compensatory strategies with supervision verbal cues to minimize overt s/s of aspiration. SLP Short Term Goal 1 - Progress (Week 1): Progressing toward goal SLP Short Term Goal 2 (Week 1): Pt will consume trials of thin liquids without overt s/s of aspiration.  SLP Short Term Goal 2 - Progress (Week 1): Progressing toward goal SLP Short Term Goal 3 (Week 1): Pt will utilize external memory aids to recall new, daily information with Mod A verbal and question cues.  SLP Short Term Goal 3 - Progress (Week 1): Progressing toward goal SLP Short Term Goal 4 (Week 1): Pt will utilize call bell to express wants/needs with Mod A verbal and question cues.  SLP Short Term Goal 5 (Week 1): Pt will demonstrate functional problem solving with basic and familiar tasks with Mod A verbal cues.  SLP Short Term Goal 5 - Progress (Week 1): Progressing toward goal SLP Short Term Goal 6 (Week 1): Pt will demonstrate sustained attention to functional tasks for 10 minutes with Mod A verbal cues for redirection.  SLP Short Term Goal 6 - Progress (Week 1): Progressing toward goal  Skilled Therapeutic Interventions: Treatment session focused on addressing dysphagia goals, and cognitive goals. SLP provided skilled observation of patient's toleration of PO intake for trial of thin liquids (plain water). Patient self-fed from cup, with SLP providing initial cues to remind him to take small sips. Patient exhibited intermittent mild throat clearing, however no change in vocal quality, and no other overt s/s aspiration noted. SLP directed patient to complete functional problem solving tasks, for time/date orientation, and selective and  alternating attention tasks. Patient required moderate verbal and visual cues to identify date on calendar, and to correctly identify monetary values of coins (initially, identifed quarter as a nickel.) Patient did exhibit some awareness to errors, as evidenced by him self-correcting without cues, when he transposed two numbers that he had written down, saying "see, that's what I do sometimes". Patient's spouse and family members present at end of session; SLP provided education regarding cognitive deficits and therapeutic intervention strategegies.    FIM:  Comprehension Comprehension Mode: Auditory Comprehension: 3-Understands basic 50 - 74% of the time/requires cueing 25 - 50%  of the time Expression Expression Mode: Verbal Expression: 4-Expresses basic 75 - 89% of the time/requires cueing 10 - 24% of the time. Needs helper to occlude trach/needs to repeat words. Social Interaction Social Interaction: 4-Interacts appropriately 75 - 89% of the time - Needs redirection for appropriate language or to initiate interaction. Problem Solving Problem Solving: 2-Solves basic 25 - 49% of the time - needs direction more than half the time to initiate, plan or complete simple activities Memory Memory: 2-Recognizes or recalls 25 - 49% of the time/requires cueing 51 - 75% of the time  Pain Pain Assessment Pain Assessment: No/denies pain Pain Score: 0-No pain  Therapy/Group: Individual Therapy  Pablo Lawrence 11/28/2012, 3:06 PM  Angela Nevin, MA, CCC-SLP [facility] Speech-Language Pathologist

## 2012-11-28 NOTE — Progress Notes (Signed)
Physical Therapy Session Note  Patient Details  Name: Walter Frazier MRN: [id number] Date of Birth: 1956-04-13  Today's Date: 11/28/2012 Time: 3474-2595 Time Calculation (min): 69 min  Short Term Goals: Week 1:  PT Short Term Goal 1 (Week 1): STGs=LTGs  Skilled Therapeutic Interventions/Progress Updates:   Therapy session to focus on patient's sustained attention, sequencing, intellectual and emergent awareness during functional tasks.  Discussed with patient job duties and hobbies PTA. 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  Pt performed task of scanning gym for bean bags and retrieving bags from floor, tables, stools and placing them in a box and carrying box back to mat; pt was able to recall that he needed to squat to reach items on the floor to protect his back and was able to problem solve putting box on rolling stool and push it with him for energy conservation and to protect his back.  Pt was able to reach to floor multiple times safely with supervision.  Also performed standing tool sorting task placing specific tools in specific bin for standing endurance, following multi step cues, and attend to task and sequence task.  Pt required max-total verbal and visual cues to organize sorting task and recall which bin tools belonged in and to complete task.  In between tasks pt rested in sitting in order to assess vitals.  Also educated on deep, pursed lip breathing.  Also performed 6 minute walk test of endurance.  Pt ambulated for 6 minutes 993 feet with supervision and HR: 99 bpm and Sp02: 99% after test.  Performed dynamic balance/gait activity with rotation and scanning environment with patient performing multiple head turns to find ball, retrieve and then return ball to therapist x 300' with supervision.  Performed path finding activity where pt had to recall and use environmental cues to find room with questioning cues about use of signs,  name on door, etc.  Pt required mod cues for use of external cues to find room.  In room pt showed pictures of family and carpentry jobs correctly identifying all family members and names.  Pt left in bed with 3 rails and bed alarm on with call bell and phone in reach.    Therapy Documentation Precautions:  Precautions Precautions: Fall Precaution Comments: visual deficits to the right Restrictions Weight Bearing Restrictions: No Vital Signs: Therapy Vitals Pulse Rate: 95 BP: 110/68 mmHg Oxygen Therapy SpO2: 99 % O2 Device: None (Room air) Pain: Pain Assessment Pain Assessment: No/denies pain Locomotion : Ambulation Ambulation/Gait Assistance: 4: Min guard   See FIM for current functional status  Therapy/Group: Individual Therapy  Edman Circle [facility] 11/28/2012, 12:32 PM

## 2012-11-28 NOTE — Progress Notes (Signed)
Nursing Note: Pt has been asleep and resting well.wbb

## 2012-11-28 NOTE — Progress Notes (Signed)
Occupational Therapy Note  Patient Details  Name: Walter Frazier MRN: [id number] Date of Birth: December 21, 1955 Today's Date: 11/28/2012  Time:  0900-0945  (45 min)  1st session Pain:  None Individual session  Addressed bed mobility, transfer, functional mobility, standing balance.  Pt. Was supervision with bed mobility, transfers to shower, toilet.  Pt.ambulated from bed to toilet and then to shower.  Stood during bathing with cues to wash various body parts.  Needed minimal assist for sustained attention.  Pt. Bathed and dressed self with minimal cues for organization.  Stood at sink with supervision for 3 minutes to brush teeth.   2nd session   Individual session     Time:  1130-1200  (30 min)   Pain:  None Addressed working memory, problem solving, functional mobility, selective attention.   Ambulated from room to gift shop.  Provided verbal cues for pt to attend to signs and objects so he could negotiate his way back to his room.   While in the gift, ask pt to find various items which he did with increased time.  On elevator, pt needed cues to find the right elevator button on the far right side.  Pt needed minimal verbal cues to find way back to room .  Wife present.  Pt was able to recall what he did in the session and with questioning cues recalled some details.    Humberto Seals 11/28/2012, 9:43 AM

## 2012-11-28 NOTE — Progress Notes (Unsigned)
Patient ID: Walter Frazier, male   DOB: Jun 07, 1955, 57 y.o.   MRN: [id number]

## 2012-11-28 NOTE — Progress Notes (Signed)
Nursing Note: Pt awake and got oob and would not stay in bed despite being re-oriented.Pt up to nurse's desk in w/c w/ quick release belt for safety. Pt w/ increased agitation and talking loud and insists that he leave ."I have got to get out of here to go and make some money" All attempts to re-orient were unsuccessful.A; Called wife and pt calmed down and agreed to go to bed after talking w/ his wife.wbb

## 2012-11-29 ENCOUNTER — Inpatient Hospital Stay (HOSPITAL_COMMUNITY): Payer: BC Managed Care – PPO | Admitting: Physical Therapy

## 2012-11-29 ENCOUNTER — Encounter (HOSPITAL_COMMUNITY): Payer: BC Managed Care – PPO | Admitting: Occupational Therapy

## 2012-11-29 ENCOUNTER — Inpatient Hospital Stay (HOSPITAL_COMMUNITY): Payer: BC Managed Care – PPO | Admitting: Speech Pathology

## 2012-11-29 DIAGNOSIS — J9600 Acute respiratory failure, unspecified whether with hypoxia or hypercapnia: Secondary | ICD-10-CM

## 2012-11-29 DIAGNOSIS — I469 Cardiac arrest, cause unspecified: Secondary | ICD-10-CM

## 2012-11-29 DIAGNOSIS — G931 Anoxic brain damage, not elsewhere classified: Secondary | ICD-10-CM

## 2012-11-29 MED ORDER — CARVEDILOL 6.25 MG PO TABS
6.2500 mg | ORAL_TABLET | Freq: Two times a day (BID) | ORAL | Status: DC
  Administered 2012-11-29 – 2012-12-04 (×10): 6.25 mg via ORAL
  Filled 2012-11-29 (×12): qty 1

## 2012-11-29 MED ORDER — ENALAPRIL MALEATE 2.5 MG PO TABS
2.5000 mg | ORAL_TABLET | Freq: Every day | ORAL | Status: DC
  Administered 2012-11-30 – 2012-12-04 (×5): 2.5 mg via ORAL
  Filled 2012-11-29 (×7): qty 1

## 2012-11-29 NOTE — Progress Notes (Signed)
Physical Therapy Note  Patient Details  Name: Walter Frazier MRN: [id number] Date of Birth: [birth year] Today's Date: 11/29/2012  3086-5784 (55 minutes) lindividual Pain: no reported pain Other: BP (supine) 90/60 pulse 90 (no complaint of dizziness); standing - 91/65 (no complaint of dizziness) Focus of treatment: therapeutic exercise focused on activity tolerance; gait training; therapeutic activity focused on improved attention to task, direction finding (wc mobility)) Treatment: Pt in bed upon arrival (see BPs above); wc mobility - pt propels wc using bilateral LEs 150 feet SBA with vcs or cues using signs to find gym; Nustep Level 4 X 10 minutes with perceived exertion of light; pt performed pipe tree activity with mod vcs for completion of design; gait 150 feet SBA - pt able to locate his room without cueing .   1330-1415 (45 minutes) individual Pain: no reported pain Focus of treatment : therapeutic activities focused on cognitive activities ( addition, matching, direction finding) Treatment: Pt required max vcs to locate gym during gait (using sign); pt performed bean bag toss with focus of adding 2 number scores. Pt required at least 60% vcs for correct addition and at times writing correct number; pt able to match suites (cards) with 100% accuracy; gait on unit 200 feet SBA (for direction finding, no loss of balance noted).   Aryn Safran,JIM 11/29/2012, 9:19 AM

## 2012-11-29 NOTE — Progress Notes (Signed)
Occupational Therapy Session Note  Patient Details  Name: Walter Frazier MRN: [id number] Date of Birth: 20-Apr-1956  Today's Date: 11/29/2012 Time: 6213-0865 Time Calculation (min): 45 min  Skilled Therapeutic Interventions/Progress Updates: Patient in breakfast group eating with nursing.   Then completed shower standing with no losses of balance.  Patient completed clothing retrieval and all aspects of his self care with minimal cues to complete and close S to CGA when he stood in shower to wash feet.    Patient left in bed resting with bed alarm on after session.     Therapy Documentation Precautions:  Precautions Precautions: Fall Precaution Comments: visual deficits to the right Restrictions Weight Bearing Restrictions: No  Pain: Pain Assessment Pain Assessment: No/denies pain Pain Score: 0-No pain   See FIM for current functional status  Therapy/Group: Individual Therapy  Bud Face [facility] 11/29/2012, 12:29 PM

## 2012-11-29 NOTE — Progress Notes (Signed)
Speech Language Pathology Daily Session Note  Patient Details  Name: Walter Frazier MRN: [id number] Date of Birth: 14-Nov-1955  Today's Date: 11/29/2012 Time: 4098-1191 Time Calculation (min): 45 min  Short Term Goals: Week 1: SLP Short Term Goal 1 (Week 1): Pt will utilize swallowing compensatory strategies with supervision verbal cues to minimize overt s/s of aspiration. SLP Short Term Goal 1 - Progress (Week 1): Progressing toward goal SLP Short Term Goal 2 (Week 1): Pt will consume trials of thin liquids without overt s/s of aspiration.  SLP Short Term Goal 2 - Progress (Week 1): Progressing toward goal SLP Short Term Goal 3 (Week 1): Pt will utilize external memory aids to recall new, daily information with Mod A verbal and question cues.  SLP Short Term Goal 3 - Progress (Week 1): Progressing toward goal SLP Short Term Goal 4 (Week 1): Pt will utilize call bell to express wants/needs with Mod A verbal and question cues.  SLP Short Term Goal 5 (Week 1): Pt will demonstrate functional problem solving with basic and familiar tasks with Mod A verbal cues.  SLP Short Term Goal 5 - Progress (Week 1): Progressing toward goal SLP Short Term Goal 6 (Week 1): Pt will demonstrate sustained attention to functional tasks for 10 minutes with Mod A verbal cues for redirection.  SLP Short Term Goal 6 - Progress (Week 1): Progressing toward goal  Skilled Therapeutic Interventions: Treatment focus on cognitive goals. SLP facilitated session by providing Max A question and semantic cues for utilization of association as a strategy to increase recall during a memory task. Pt also demonstrated decreased emergent awareness into difficulty of task.  Pt required Mod question cues to recall tasks from previous OT and PT sessions. Pt located room with supervision question cues and transferred from the wheelchair to his bed with Min verbal and question cues for safety.    FIM:  Comprehension Comprehension  Mode: Auditory Comprehension: 3-Understands basic 50 - 74% of the time/requires cueing 25 - 50%  of the time Expression Expression Mode: Verbal Expression: 4-Expresses basic 75 - 89% of the time/requires cueing 10 - 24% of the time. Needs helper to occlude trach/needs to repeat words. Social Interaction Social Interaction: 4-Interacts appropriately 75 - 89% of the time - Needs redirection for appropriate language or to initiate interaction. Problem Solving Problem Solving: 2-Solves basic 25 - 49% of the time - needs direction more than half the time to initiate, plan or complete simple activities Memory Memory: 2-Recognizes or recalls 25 - 49% of the time/requires cueing 51 - 75% of the time  Pain Pain Assessment Pain Assessment: No/denies pain Pain Score: 0-No pain  Therapy/Group: Individual Therapy  An Lannan 11/29/2012, 12:00 PM

## 2012-11-29 NOTE — Progress Notes (Signed)
Patient ID: Walter Frazier, male   DOB: 04-16-1956, 57 y.o.   MRN: [id number] Subjective/Complaints: [place] [employer] [place] no complaints A 12 point review of systems has been performed and if not noted above is otherwise negative.   Objective: Vital Signs: Blood pressure 91/61, pulse 78, temperature 98 F (36.7 C), temperature source Oral, resp. rate 17, height 5\' 7"  (1.702 m), weight 70.5 kg (155 lb 6.8 oz), SpO2 100.00%. No results found. No results found for this basename: WBC, HGB, HCT, PLT,  in the last 72 hours No results found for this basename: NA, K, CL, CO, GLUCOSE, BUN, CREATININE, CALCIUM,  in the last 72 hours CBG (last 3)  No results found for this basename: GLUCAP,  in the last 72 hours  Wt Readings from Last 3 Encounters:  11/27/12 70.5 kg (155 lb 6.8 oz)  11/25/12 69.3 kg (152 lb 12.5 oz)    Physical Exam:  Constitutional: He appears well-developed and well-nourished. No distress. Sitting at table eating breakfast  HENT: oral mucosa pink and moist  Head: Normocephalic and atraumatic.   Trach site healed.  Neurological:  Alert, non-agitated.   Oriented to self, place with cueing. Knew why he was here without cueing!  Distracted easily. Delayed processing with simple and more complex tasks.  Assessment/Plan: 1. 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 Physiatrist is providing close team supervision and 24 hour management of active medical problems listed below. Physiatrist and rehab team continue to assess barriers to discharge/monitor patient progress toward functional and medical goals. FIM: FIM - Bathing Bathing Steps Patient Completed: Chest;Right Arm;Left Arm;Abdomen;Front perineal area;Buttocks;Right upper leg;Left upper leg;Right lower leg (including foot);Left lower leg (including foot) Bathing: 4: Steadying assist  FIM - Upper  Body Dressing/Undressing Upper body dressing/undressing steps patient completed: Thread/unthread right sleeve of pullover shirt/dresss;Thread/unthread left sleeve of pullover shirt/dress;Put head through opening of pull over shirt/dress;Pull shirt over trunk Upper body dressing/undressing: 5: Set-up assist to: Obtain clothing/put away FIM - Lower Body Dressing/Undressing Lower body dressing/undressing steps patient completed: Thread/unthread right underwear leg;Thread/unthread left underwear leg;Pull underwear up/down;Thread/unthread right pants leg;Thread/unthread left pants leg;Pull pants up/down;Don/Doff right sock;Don/Doff left sock;Don/Doff left shoe;Fasten/unfasten left shoe;Don/Doff right shoe;Fasten/unfasten right shoe Lower body dressing/undressing: 5: Set-up assist to: Don/Doff TED stocking  FIM - Toileting Toileting steps completed by patient: Adjust clothing prior to toileting;Adjust clothing after toileting Toileting: 4: Steadying assist  FIM - Archivist Transfers: 5-To toilet/BSC: Supervision (verbal cues/safety issues)  FIM - Banker Devices: Bed rails Bed/Chair Transfer: 4: Bed > Chair or W/C: Min A (steadying Pt. > 75%);4: Chair or W/C > Bed: Min A (steadying Pt. > 75%)  FIM - Locomotion: Wheelchair Distance: 70 Locomotion: Wheelchair: 1: Total Assistance/staff pushes wheelchair (Pt<25%) FIM - Locomotion: Ambulation Ambulation/Gait Assistance: 4: Min guard Locomotion: Ambulation: 4: Travels 150 ft or more with minimal assistance (Pt.>75%)  Comprehension Comprehension Mode: Auditory Comprehension: 3-Understands basic 50 - 74% of the time/requires cueing 25 - 50%  of the time  Expression Expression Mode: Verbal Expression: 4-Expresses basic 75 - 89% of the time/requires cueing 10 - 24% of the time. Needs helper to occlude trach/needs to repeat words.  Social Interaction Social Interaction: 4-Interacts appropriately  75 - 89% of the time - Needs redirection for appropriate language or to initiate interaction.  Problem Solving Problem Solving: 2-Solves basic 25 - 49% of the time - needs  direction more than half the time to initiate, plan or complete simple activities  Memory Memory: 2-Recognizes or recalls 25 - 49% of the time/requires cueing 51 - 75% of the time  Medical Problem List and Plan:  1. Severe deconditioning/anoxic encephalopathy after cardiac arrest/postop VDRF  2. DVT Prophylaxis/Anticoagulation: Subcutaneous Lovenox. Monitor platelet counts and any signs of bleeding  3. Pain Management: Tylenol as needed. Monitor with increased mobility  4. Mood: continue sleep chart  -increase seroquel to 150mg  qhs and schedule earlier 5. Neuropsych: This patient is not capable of making decisions on his own behalf.  6. Dysphagia. Dysphagia 3 nectar thick liquids. Followup speech therapy and monitor for any signs of aspiration  7. seizure disorder. Dilantin 200 mg twice a day, valproic acid 500 mg twice a day.  Monitor for any signs of seizure activity. Drug levels remain low. Increase dosing and recheck next week.  8. Hypertension. Coreg 9.375 mg twice a day, Vasotec 2.5 mg twice a day, Aldactone 12.5 mg daily. Monitor with increased activity  9. Tobacco abuse. Counseling  10. Hyperlipidemia. Lipitor    LOS (Days) 4 A FACE TO FACE EVALUATION WAS PERFORMED  Claudette Laws E 11/29/2012 9:20 AM

## 2012-11-30 ENCOUNTER — Inpatient Hospital Stay (HOSPITAL_COMMUNITY): Payer: BC Managed Care – PPO | Admitting: Physical Therapy

## 2012-11-30 DIAGNOSIS — I469 Cardiac arrest, cause unspecified: Secondary | ICD-10-CM

## 2012-11-30 DIAGNOSIS — J9600 Acute respiratory failure, unspecified whether with hypoxia or hypercapnia: Secondary | ICD-10-CM

## 2012-11-30 DIAGNOSIS — G931 Anoxic brain damage, not elsewhere classified: Secondary | ICD-10-CM

## 2012-11-30 MED ORDER — WHITE PETROLATUM GEL
Status: AC
  Administered 2012-11-30: 18:00:00
  Filled 2012-11-30: qty 5

## 2012-11-30 NOTE — Progress Notes (Signed)
Physical Therapy Note  Patient Details  Name: Walter Frazier MRN: [id number] Date of Birth: [birth year] Today's Date: 11/30/2012  1500-1555 (55 minutes) individual Pain: no complaint of pain Focus of treatment: Therapeutic activities focused on recall, completion of task from illustration, direction finding Treatment: Pt to complete pipe tree project according to illustrations (2) that were completed yesterday ; pt continued to need mod vcs as yesterday with following illustration selecting correct fittings and lengths of pipe; pt to locate one room on 4000 and one room on 4100. Pt required vcs to differentiate between 4000 and 4100 numbers but could use overhead signs to locate correct rooms.    Coralyn Roselli,JIM 11/30/2012, 3:31 PM

## 2012-11-30 NOTE — Progress Notes (Signed)
Patient ID: Walter Frazier, male   DOB: Jan 31, 1956, 57 y.o.   MRN: [id number] Subjective/Complaints: Pt doesn't notice trach airleak but it is audible during speech A 12 point review of systems has been performed and if not noted above is otherwise negative.   Objective: Vital Signs: Blood pressure 109/69, pulse 74, temperature 97.8 F (36.6 C), temperature source Oral, resp. rate 18, height 5\' 7"  (1.702 m), weight 70.5 kg (155 lb 6.8 oz), SpO2 100.00%. No results found. No results found for this basename: WBC, HGB, HCT, PLT,  in the last 72 hours No results found for this basename: NA, K, CL, CO, GLUCOSE, BUN, CREATININE, CALCIUM,  in the last 72 hours CBG (last 3)  No results found for this basename: GLUCAP,  in the last 72 hours  Wt Readings from Last 3 Encounters:  11/27/12 70.5 kg (155 lb 6.8 oz)  11/25/12 69.3 kg (152 lb 12.5 oz)    Physical Exam:  Constitutional: He appears well-developed and well-nourished. No distress. Sitting at table eating breakfast  HENT: oral mucosa pink and moist  Head: Normocephalic and atraumatic.  Lungs clear to auscultation  Heart regular rate and rhythm no rubs murmurs or extra sounds  Abdomen positive bowel sounds soft nontender palpation  Trach site healing, still with 2-29mm open stoma.  Neurological:  Alert, non-agitated.   Oriented to self, place with cueing. Knew why he was here without cueing!  . Delayed processing with simple and more complex tasks.  Assessment/Plan: 1. 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 Physiatrist is providing close team supervision and 24 hour management of active medical problems listed below. Physiatrist and rehab team continue to assess barriers to discharge/monitor patient progress toward functional and medical goals. FIM: FIM - Bathing Bathing Steps Patient Completed: Chest;Right  Arm;Left Arm;Abdomen;Front perineal area;Buttocks;Right upper leg;Left upper leg;Right lower leg (including foot);Left lower leg (including foot) Bathing: 4: Steadying assist (stood for the whole shower)  FIM - Upper Body Dressing/Undressing Upper body dressing/undressing steps patient completed: Thread/unthread right sleeve of pullover shirt/dresss;Pull shirt over trunk;Put head through opening of pull over shirt/dress;Thread/unthread left sleeve of pullover shirt/dress Upper body dressing/undressing: 5: Supervision: Safety issues/verbal cues FIM - Lower Body Dressing/Undressing Lower body dressing/undressing steps patient completed: Thread/unthread right pants leg;Thread/unthread left pants leg;Pull pants up/down (elected to go barefoot) Lower body dressing/undressing: 5: Supervision: Safety issues/verbal cues  FIM - Toileting Toileting steps completed by patient: Adjust clothing prior to toileting;Adjust clothing after toileting Toileting: 0: Activity did not occur  FIM - Diplomatic Services operational officer Devices: Grab bars Toilet Transfers: 6-Assistive device: No helper  FIM - Banker Devices: Bed rails Bed/Chair Transfer: 4: Bed > Chair or W/C: Min A (steadying Pt. > 75%);4: Chair or W/C > Bed: Min A (steadying Pt. > 75%)  FIM - Locomotion: Wheelchair Distance: 70 Locomotion: Wheelchair: 1: Total Assistance/staff pushes wheelchair (Pt<25%) FIM - Locomotion: Ambulation Ambulation/Gait Assistance: 4: Min guard Locomotion: Ambulation: 4: Travels 150 ft or more with minimal assistance (Pt.>75%)  Comprehension Comprehension Mode: Auditory Comprehension: 4-Understands basic 75 - 89% of the time/requires cueing 10 - 24% of the time  Expression Expression Mode: Verbal Expression: 4-Expresses basic 75 - 89% of the time/requires cueing 10 - 24% of the time. Needs helper to occlude trach/needs to repeat words.  Social  Interaction Social Interaction: 5-Interacts appropriately 90% of the time - Needs monitoring or  encouragement for participation or interaction.  Problem Solving Problem Solving: 4-Solves basic 75 - 89% of the time/requires cueing 10 - 24% of the time  Memory Memory: 2-Recognizes or recalls 25 - 49% of the time/requires cueing 51 - 75% of the time  Medical Problem List and Plan:  1. Severe deconditioning/anoxic encephalopathy after cardiac arrest/postop VDRF  2. DVT Prophylaxis/Anticoagulation: Subcutaneous Lovenox. Monitor platelet counts and any signs of bleeding  3. Pain Management: Tylenol as needed. Monitor with increased mobility  4. Mood: continue sleep chart  -increase seroquel to 150mg  qhs and schedule earlier 5. Neuropsych: This patient is not capable of making decisions on his own behalf.  6. Dysphagia. Dysphagia 3 nectar thick liquids. Followup speech therapy and monitor for any signs of aspiration  7. seizure disorder. Dilantin 200 mg twice a day, valproic acid 500 mg twice a day.  Monitor for any signs of seizure activity. Drug levels remain low. Increase dosing and recheck next week.  8. Hypertension. Coreg 9.375 mg twice a day, Vasotec 2.5 mg twice a day, Aldactone 12.5 mg daily. Monitor with increased activity  9. Tobacco abuse. Counseling  10. Hyperlipidemia. Lipitor    LOS (Days) 5 A FACE TO FACE EVALUATION WAS PERFORMED  Erick Colace 11/30/2012 9:45 AM

## 2012-12-01 ENCOUNTER — Encounter (HOSPITAL_COMMUNITY): Payer: BC Managed Care – PPO

## 2012-12-01 ENCOUNTER — Inpatient Hospital Stay (HOSPITAL_COMMUNITY): Payer: BC Managed Care – PPO | Admitting: Speech Pathology

## 2012-12-01 ENCOUNTER — Inpatient Hospital Stay (HOSPITAL_COMMUNITY): Payer: BC Managed Care – PPO | Admitting: Physical Therapy

## 2012-12-01 ENCOUNTER — Inpatient Hospital Stay (HOSPITAL_COMMUNITY): Payer: BC Managed Care – PPO | Admitting: Occupational Therapy

## 2012-12-01 DIAGNOSIS — G931 Anoxic brain damage, not elsewhere classified: Secondary | ICD-10-CM

## 2012-12-01 DIAGNOSIS — I469 Cardiac arrest, cause unspecified: Secondary | ICD-10-CM

## 2012-12-01 DIAGNOSIS — J9600 Acute respiratory failure, unspecified whether with hypoxia or hypercapnia: Secondary | ICD-10-CM

## 2012-12-01 NOTE — Progress Notes (Signed)
Physical Therapy Note  Patient Details  Name: Walter Frazier MRN: [id number] Date of Birth: 09-29-1955 Today's Date: 12/01/2012  Time 1: 1100-1155 55 minutes  1:1 No c/o pain.  Gait training outdoors with pt able to pathfind with min cuing, close supervision to steadying assist with gait on a variety of surfaces, incline/declines, stair negotiation.  Pt requires min cuing to take rest breaks when fatigued.  Standing balance training with min A for cone tapping, pt able to tap a sequence of 3 with 100% accuracy, pt 75% accurate with sequences of 4.  Gait with ball toss and bounce with close supervision.  Attempt to have pt walk and name items in a group (vegetables, fruits).  Pt able to gait with supervision, requires mod-max verbal cuing for naming objects.  Improving balance, limited by impaired cognition.    Time: 1400-1430 30 minutes  1:1 No c/o pain. Pt performed kitchen task making muffins.  Pt requires supervision only for gait and balance, min-mod cuing for sequencing and problem solving kitchen tasks.  Pt able to come up with strategies for memory with min-mod questioning cuing.  Walter Frazier 12/01/2012, 12:22 PM

## 2012-12-01 NOTE — Progress Notes (Signed)
Occupational Therapy Session Note  Patient Details  Name: Walter Frazier MRN: [id number] Date of Birth: 1955-10-15  Today's Date: 12/01/2012 Time: 0830-0930 Time Calculation (min): 60 min  Short Term Goals: Week 1:  OT Short Term Goal 1 (Week 1): Pt will pick out clothing with extra time and min cues OT Short Term Goal 2 (Week 1): Pt will complete shower transfer with supervision and min cues OT Short Term Goal 3 (Week 1): Pt will complete UB dressing with supervision and min cues OT Short Term Goal 4 (Week 1): Pt will complete LB dressing with min assist and min cues OT Short Term Goal 5 (Week 1): Pt will complete toilet transfer with supervision and min cues  Skilled Therapeutic Interventions/Progress Updates:    1:1 self care retraining at shower level standing. Focus on sequencing, task organization, providing extra time and questioning cues for simple problem solving, standing balance. Pt did have LOB standing and trying threading underwear- pt needed mod cuing to problem solving sitting is the better option for LB dressing to remain safe. Pt improved with sequencing and task organization today only needing min cuing through out session.  Therapy Documentation Precautions:  Precautions Precautions: Fall Precaution Comments: visual deficits to the right Restrictions Weight Bearing Restrictions: No Pain:  no c/o pain  See FIM for current functional status  Therapy/Group: Individual Therapy  Roney Mans [facility] 12/01/2012, 9:32 AM

## 2012-12-01 NOTE — Progress Notes (Signed)
Patient ID: Walter Frazier, male   DOB: 10/08/1955, 57 y.o.   MRN: [id number] Subjective/Complaints: No complaints. Rested better.  A 12 point review of systems has been performed and if not noted above is otherwise negative.   Objective: Vital Signs: Blood pressure 104/60, pulse 78, temperature 98 F (36.7 C), temperature source Oral, resp. rate 17, height 5\' 7"  (1.702 m), weight 70.5 kg (155 lb 6.8 oz), SpO2 97.00%. No results found. No results found for this basename: WBC, HGB, HCT, PLT,  in the last 72 hours No results found for this basename: NA, K, CL, CO, GLUCOSE, BUN, CREATININE, CALCIUM,  in the last 72 hours CBG (last 3)  No results found for this basename: GLUCAP,  in the last 72 hours  Wt Readings from Last 3 Encounters:  11/27/12 70.5 kg (155 lb 6.8 oz)  11/25/12 69.3 kg (152 lb 12.5 oz)    Physical Exam:  Constitutional: He appears well-developed and well-nourished. No distress. Sitting at table eating breakfast  HENT: oral mucosa pink and moist  Head: Normocephalic and atraumatic.  Lungs clear to auscultation  Heart regular rate and rhythm no rubs murmurs or extra sounds  Abdomen positive bowel sounds soft nontender palpation  Trach site healing, with closing stoma Neurological:  Alert, non-agitated.   Oriented to self, place with cueing. Persistent delay processing with simple and more complex tasks, but showing improvement.  Assessment/Plan: 1. 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 Physiatrist is providing close team supervision and 24 hour management of active medical problems listed below. Physiatrist and rehab team continue to assess barriers to discharge/monitor patient progress toward functional and medical goals.  Pt showing improvements in memory and insight with all therapies.   FIM: FIM - Bathing Bathing Steps Patient  Completed: Chest;Right Arm;Left Arm;Abdomen;Front perineal area;Buttocks;Right upper leg;Left upper leg;Right lower leg (including foot);Left lower leg (including foot) Bathing: 4: Steadying assist (stood for the whole shower)  FIM - Upper Body Dressing/Undressing Upper body dressing/undressing steps patient completed: Thread/unthread right sleeve of pullover shirt/dresss;Pull shirt over trunk;Put head through opening of pull over shirt/dress;Thread/unthread left sleeve of pullover shirt/dress Upper body dressing/undressing: 5: Supervision: Safety issues/verbal cues FIM - Lower Body Dressing/Undressing Lower body dressing/undressing steps patient completed: Thread/unthread right pants leg;Thread/unthread left pants leg;Pull pants up/down (elected to go barefoot) Lower body dressing/undressing: 5: Supervision: Safety issues/verbal cues  FIM - Toileting Toileting steps completed by patient: Adjust clothing prior to toileting;Adjust clothing after toileting Toileting: 4: Steadying assist  FIM - Diplomatic Services operational officer Devices: Grab bars Toilet Transfers: 7-To toilet/BSC  FIM - Banker Devices: Bed rails Bed/Chair Transfer: 4: Bed > Chair or W/C: Min A (steadying Pt. > 75%);4: Chair or W/C > Bed: Min A (steadying Pt. > 75%)  FIM - Locomotion: Wheelchair Distance: 70 Locomotion: Wheelchair: 1: Total Assistance/staff pushes wheelchair (Pt<25%) FIM - Locomotion: Ambulation Ambulation/Gait Assistance: 4: Min guard Locomotion: Ambulation: 4: Travels 150 ft or more with minimal assistance (Pt.>75%)  Comprehension Comprehension Mode: Auditory Comprehension: 4-Understands basic 75 - 89% of the time/requires cueing 10 - 24% of the time  Expression Expression Mode: Verbal Expression: 4-Expresses basic 75 - 89% of the time/requires cueing 10 - 24% of the time. Needs helper to occlude trach/needs to repeat words.  Social  Interaction Social Interaction: 5-Interacts appropriately 90% of the time - Needs monitoring or encouragement for participation or interaction.  Problem Solving Problem Solving: 4-Solves basic 75 - 89% of the time/requires cueing 10 - 24% of the time  Memory Memory: 2-Recognizes or recalls 25 - 49% of the time/requires cueing 51 - 75% of the time  Medical Problem List and Plan:  1. Severe deconditioning/anoxic encephalopathy after cardiac arrest/postop VDRF  2. DVT Prophylaxis/Anticoagulation: Subcutaneous Lovenox. Monitor platelet counts and any signs of bleeding  3. Pain Management: Tylenol as needed. Monitor with increased mobility  4. Mood: continue sleep chart  -increased seroquel to 150mg  qhs and schedule earlier 5. Neuropsych: This patient is not capable of making decisions on his own behalf.  6. Dysphagia. Dysphagia 3 nectar thick liquids. Followup speech therapy and monitor for any signs of aspiration  7. seizure disorder. Dilantin 200 mg twice a day, valproic acid 500 mg twice a day.  Monitor for any signs of seizure activity. Drug levels remain low. Increased dosing and recheck this week.  8. Hypertension. Coreg 9.375 mg twice a day, Vasotec 2.5 mg twice a day, Aldactone 12.5 mg daily. Monitor with increased activity  9. Tobacco abuse. Counseling  10. Hyperlipidemia. Lipitor    LOS (Days) 6 A FACE TO FACE EVALUATION WAS PERFORMED  Marijane Trower T 12/01/2012 8:16 AM

## 2012-12-01 NOTE — Progress Notes (Signed)
Speech Language Pathology Daily Session Note  Patient Details  Name: Walter Frazier MRN: [id number] Date of Birth: June 18, 1955  Today's Date: 12/01/2012 Time: 1000-1045 Time Calculation (min): 45 min  Short Term Goals: Week 1: SLP Short Term Goal 1 (Week 1): Pt will utilize swallowing compensatory strategies with supervision verbal cues to minimize overt s/s of aspiration. SLP Short Term Goal 1 - Progress (Week 1): Progressing toward goal SLP Short Term Goal 2 (Week 1): Pt will consume trials of thin liquids without overt s/s of aspiration.  SLP Short Term Goal 2 - Progress (Week 1): Progressing toward goal SLP Short Term Goal 3 (Week 1): Pt will utilize external memory aids to recall new, daily information with Mod A verbal and question cues.  SLP Short Term Goal 3 - Progress (Week 1): Progressing toward goal SLP Short Term Goal 4 (Week 1): Pt will utilize call bell to express wants/needs with Mod A verbal and question cues.  SLP Short Term Goal 5 (Week 1): Pt will demonstrate functional problem solving with basic and familiar tasks with Mod A verbal cues.  SLP Short Term Goal 5 - Progress (Week 1): Progressing toward goal SLP Short Term Goal 6 (Week 1): Pt will demonstrate sustained attention to functional tasks for 10 minutes with Mod A verbal cues for redirection.  SLP Short Term Goal 6 - Progress (Week 1): Progressing toward goal  Skilled Therapeutic Interventions: Treatment focus on cognitive goals. SLP facilitated session by providing Min question cues for path finding to and from pt's room. Pt also independently recalled that maintenance needed to fix something in his room while he was in his treatment session and asked this clinician to call and let them know he was now in therapy. Pt demonstrated increased emergent awareness of decreased memory/thought organization during generative naming task. Pt also required Min A question cues for functional problem solving/safety with  transfer from wheelchair to his bed.    FIM:  Comprehension Comprehension Mode: Auditory Comprehension: 4-Understands basic 75 - 89% of the time/requires cueing 10 - 24% of the time Expression Expression Mode: Verbal Expression: 4-Expresses basic 75 - 89% of the time/requires cueing 10 - 24% of the time. Needs helper to occlude trach/needs to repeat words. Social Interaction Social Interaction: 5-Interacts appropriately 90% of the time - Needs monitoring or encouragement for participation or interaction. Problem Solving Problem Solving: 4-Solves basic 75 - 89% of the time/requires cueing 10 - 24% of the time Memory Memory: 3-Recognizes or recalls 50 - 74% of the time/requires cueing 25 - 49% of the time  Pain Pain Assessment Pain Assessment: No/denies pain  Therapy/Group: Individual Therapy  Conor Lata 12/01/2012, 4:35 PM

## 2012-12-02 ENCOUNTER — Inpatient Hospital Stay (HOSPITAL_COMMUNITY): Payer: BC Managed Care – PPO | Admitting: Physical Therapy

## 2012-12-02 ENCOUNTER — Inpatient Hospital Stay (HOSPITAL_COMMUNITY): Payer: BC Managed Care – PPO | Admitting: Speech Pathology

## 2012-12-02 ENCOUNTER — Encounter (HOSPITAL_COMMUNITY): Payer: BC Managed Care – PPO | Admitting: Occupational Therapy

## 2012-12-02 DIAGNOSIS — I469 Cardiac arrest, cause unspecified: Secondary | ICD-10-CM

## 2012-12-02 DIAGNOSIS — G931 Anoxic brain damage, not elsewhere classified: Secondary | ICD-10-CM

## 2012-12-02 DIAGNOSIS — J9600 Acute respiratory failure, unspecified whether with hypoxia or hypercapnia: Secondary | ICD-10-CM

## 2012-12-02 DIAGNOSIS — R0602 Shortness of breath: Secondary | ICD-10-CM

## 2012-12-02 HISTORY — DX: Shortness of breath: R06.02

## 2012-12-02 HISTORY — PX: TRACHEOSTOMY CLOSURE: SHX458

## 2012-12-02 LAB — CREATININE, SERUM: Creatinine, Ser: 0.76 mg/dL (ref 0.50–1.35)

## 2012-12-02 MED ORDER — PHENYTOIN 125 MG/5ML PO SUSP
200.0000 mg | Freq: Every day | ORAL | Status: DC
  Administered 2012-12-03: 200 mg via ORAL
  Filled 2012-12-02 (×2): qty 8

## 2012-12-02 MED ORDER — PHENYTOIN 125 MG/5ML PO SUSP
300.0000 mg | Freq: Every day | ORAL | Status: DC
  Administered 2012-12-02: 300 mg via ORAL
  Filled 2012-12-02 (×2): qty 12

## 2012-12-02 NOTE — Progress Notes (Addendum)
NUTRITION FOLLOW UP  Intervention:   1. General healthful diet; continue to encourage intake of food and beverages as able.  2. Supplements; discontinue Ensure Complete po TID, each supplement provides 350 kcal and 13 grams of protein.   NUTRITION DIAGNOSIS:  Inadequate oral intake related to inability to eat as evidenced by NPO diet.   Monitor:  1. Food/Beverage; pt meeting >/=90% estimated needs with tolerance. Met.  Pt intake appears adequate 2. Wt/wt change; monitor trends.  Ongoing.  Pt has achieved stability.  Assessment:   Pt admitted after witnessed cardiac arrest with CPR and shocks. Pt now with anoxic brain damage progressing with rehab.   Pt with 12 lbs wt change since injury, however with recent wt stability at 155 lbs.  PO intake is 75-100% of meals with snacks observed at bedside.  Pt has been refusing Ensure. Pt remains on Dysphagia 3, nectar-thick liquids.   Height: Ht Readings from Last 1 Encounters:  11/25/12 5\' 7"  (1.702 m)    Weight Status:   Wt Readings from Last 1 Encounters:  11/27/12 155 lb 6.8 oz (70.5 kg)    Re-estimated needs:  Kcal: 4098-1191 Protein: 85-100g Fluid: ~2.0 L/day  Skin: intact  Diet Order: Dysphagia 3, nectar-thick   Intake/Output Summary (Last 24 hours) at 12/02/12 1153 Last data filed at 12/02/12 0827  Gross per 24 hour  Intake    840 ml  Output      0 ml  Net    840 ml    Last BM: 6/30   Labs:   Recent Labs Lab 11/25/12 2054 11/26/12 0829 12/02/12 0535  NA  --  135  --   K  --  4.2  --   CL  --  98  --   CO2  --  26  --   BUN  --  12  --   CREATININE 0.71 0.80 0.76  CALCIUM  --  9.0  --   GLUCOSE  --  147*  --     CBG (last 3)  No results found for this basename: GLUCAP,  in the last 72 hours  Scheduled Meds: . aspirin  81 mg Oral Daily  . atorvastatin  80 mg Oral q1800  . carvedilol  6.25 mg Oral BID WC  . clopidogrel  75 mg Oral Q breakfast  . enalapril  2.5 mg Oral Daily  . enoxaparin  (LOVENOX) injection  40 mg Subcutaneous Q24H  . feeding supplement  237 mL Oral TID BM  . phenytoin  200 mg Oral TID  . QUEtiapine  150 mg Oral QHS  . spironolactone  12.5 mg Oral Daily  . valproic acid  750 mg Oral BID    Continuous Infusions:   Loyce Dys, MS RD LDN Clinical Inpatient Dietitian Pager: [phone] Weekend/After hours pager: [phone]

## 2012-12-02 NOTE — Progress Notes (Signed)
Occupational Therapy Session Note  Patient Details  Name: VERDIS KOVAL MRN: [id number] Date of Birth: [birth year]  Today's Date: 12/02/2012 Time: 0930-1030 Time Calculation (min): 60 min  Short Term Goals: Week 1:  OT Short Term Goal 1 (Week 1): Pt will pick out clothing with extra time and min cues OT Short Term Goal 2 (Week 1): Pt will complete shower transfer with supervision and min cues OT Short Term Goal 3 (Week 1): Pt will complete UB dressing with supervision and min cues OT Short Term Goal 4 (Week 1): Pt will complete LB dressing with min assist and min cues OT Short Term Goal 5 (Week 1): Pt will complete toilet transfer with supervision and min cues  Skilled Therapeutic Interventions/Progress Updates:    1:1 self care retraining at shower level with focus on pt going through established morning routine. Pt able to self organized himself to obtain all items needed and place them in the BR to get ready, perform grooming at sink in standing without cuing. Performed bathing standing in shower with supervision to ensure all parts got washed. Pt did need cuing for emergent to anticipatory awareness for planning for d/c and planning for tomorrow (ie dirty clothes- performed laundry task)  Therapy Documentation Precautions:  Precautions Precautions: Fall Precaution Comments: visual deficits to the right Restrictions Weight Bearing Restrictions: No General:   Vital Signs: Therapy Vitals Pulse Rate: 80 BP: 92/66 mmHg Pain: Pain Assessment Pain Assessment: No/denies pain  See FIM for current functional status  Therapy/Group: Individual Therapy  Roney Mans [facility] 12/02/2012, 12:04 PM

## 2012-12-02 NOTE — Consult Note (Signed)
NEUROCOGNITIVE TESTING - [facility]   Mr. Ayush Boulet is a 57 year old man, who was seen for a brief neuropsychological assessment to evaluate cognitive and emotional functioning post-anoxic brain injury.  According to his medical record, he experienced out of hospital cardiac arrest and was ultimately treated with hypothermia protocol.  Following warming, he demonstrated posturing and there was concern for hypoxic injury.  CT of his head did not reveal acute changes.  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    PROCEDURES: [3 units of 78295 on 12/01/2012]  The following tests were performed during today's visit: Mini Mental Status Examination (brief version), Repeatable Battery for the Assessment of Neuropsychological Status (RBANS, form A), Beck Depression Inventory (short form for medical patients), and performance validity measures were also administered.  Test results are as follows:   MMSE-2 (brief) Raw Score = 9/16 Description = Impaired   RBANS Indices Scaled Score Percentile Description  Immediate Memory  78 7 Impaired  Visuospatial/Constructional 64 1 Profoundly Impaired  Language 71 3 Impaired  Attention 49 < 1 Profoundly Impaired  Delayed Memory 52 < 1 Profoundly Impaired  Total Score 54 < 1 Profoundly Impaired   RBANS Subtests Raw Score Percentile Description  List Learning 19 3 Impaired  Story Memory 15 25 Average  Figure Copy 5 < 1 Profoundly Impaired  Line Orientation 13 12 Below Average  Picture Naming 9 32 Average  Semantic Fluency 3 < 1 Profoundly Impaired  Digit Span 6 3 Impaired  Coding 9 < 1 Profoundly Impaired  List Recall 0 < 1 Profoundly Impaired  List Recognition 17 1 Profoundly Impaired  Story Recall 0 < 1 Profoundly Impaired  Figure recall 0 < 1 Profoundly Impaired   Beck Depression Inventory (short form) Raw Score = 3 Description = WNL   Mr. Chittum performances  on objective and embedded measures of performance validity were below expectation, but were adversely impacted by visuospatial deficits.  There were no behavioral manifestations to suggest suboptimal effort.  Therefore, results likely represent an accurate portrayal of his current level of cognitive functioning.    Mr. Elsayed total score on an overall measure of mental status was reduced and suggestive of impairments at the level of dementia.  He was disoriented to date and demonstrated memory impairment.  Other test results revealed impairments across cognitive domains.  He was intact in his abilities for immediate memory for contextual verbal information and confrontation naming.  He was below average in his ability to make fine visual distinctions.  However, all other performances were impaired.  Most notably, Mr. Sutcliffe visuospatial functioning, particularly his ability for complex visual organization, was severely impaired.  When attempting to copy a complex geometric figure, he was unable to capture the gestalt of the figure and his copy included grossly distorted and misplaced elements and several major elements were missing.  Moreover, when performing a task in which he was asked to count dots on a page, it seemed as though he was having double vision at times, as he often counted one extra dot.      From an emotional standpoint, Mr. Raether responses to a self-report measure of depressive symptoms were not suggestive of the presence of significant depressed mood.  It is notable that he demonstrated confusion when completing this measure, as he originally marked that he felt completely hopeless about his future, but later said that he did not feel hopeless at all.  He repeatedly said that all the  choices were the same, so he seemed to have some trouble with reading comprehension.  Subjectively, Mr. Dettore denied symptoms of depression and denied suicidal ideation.    In light of these  findings, the following recommendations are provided and were discussed with Mr. Torrence immediately following the testing session.    RECOMMENDATIONS:  Recommendations for treatment team:     When interacting with Mr. Hibler, directions and information should be provided in a simple, straight forward manner, and the treatment team should avoid giving multiple instructions simultaneously.    Mr. Cragle memory was improved when information was provided within a context.  Those around him could improve his chances of recalling important information, if they are able to frame it within a context or story.     Mr. Knauer may also benefit from being provided with multiple trials to learn new skills given the noted memory inefficiencies.    When possible, important information should be written down.     To the extent possible, multitasking should be avoided.   Mr. Spratlin requires more time than typical to process information. The treatment team may benefit from waiting for a verbal response to information before presenting additional information.    Performance will generally be best in a structured, routine, and familiar environment, as opposed to situations involving complex problems.    If his cognitive functioning improves significantly prior to discharge, repeat neuropsychological evaluation could be conducted.    Recommendations for discharge planning:    Complete a comprehensive neuropsychological evaluation as an outpatient in 12 months to assess for interval change.   Maintain engagement in mentally, physically and cognitively stimulating activities.    Strive to maintain a healthy lifestyle (e.g., proper diet and exercise) in order to promote physical, cognitive and emotional health.   Leavy Cella, Psy.D.  Clinical Neuropsychologist

## 2012-12-02 NOTE — Progress Notes (Signed)
Physical Therapy Session Note  Patient Details  Name: LAWRNCE REYEZ MRN: [id number] Date of Birth: Nov 04, 1955  Today's Date: 12/02/2012 Time: 1300-1400 Time Calculation (min): 60 min  Short Term Goals: Week 1:  PT Short Term Goal 1 (Week 1): STGs=LTGs  Skilled Therapeutic Interventions/Progress Updates:    Pt very pleasant and motivated. Pt performed gait training with CGA to SBA on level and uneven surfaces  on unit, navigation through hospital and outside on grass, pavement and up and down slopes and curbs with pt displaying 3 slight LOB with requiring therapist assist to recover from 1 out of 3 and self recovery 2 out of 3. Gait with having pt identify various objects and name things from short and long term memory with pt requiring mod assist, pt has to stop to perform more complex tasks and thinking, was able to follow signs through hospital with min assist.  Performed dynamic balance standing on foam during functional/cogniive tasks with pt requiring mod assist to complete 3 puzzles on pipe tree, and able to sort and identify colors, shapes and recall 4-5 step sequences with min assist. Toilet transfer with supervision assist with pt able to perform clothing management and personal care.   Therapy Documentation Precautions:  Precautions Precautions: Fall Precaution Comments: visual deficits to the right Restrictions Weight Bearing Restrictions: No    Vital Signs: Therapy Vitals Temp: 98.3 F (36.8 C) Temp src: Oral Pulse Rate: 84 Resp: 18 BP: 94/62 mmHg Patient Position, if appropriate: Lying Oxygen Therapy SpO2: 98 % Pain: Pain Assessment Pain Assessment: No/denies pain    Locomotion : Ambulation Ambulation/Gait Assistance: 4: Min guard               See FIM for current functional status  Therapy/Group: Individual Therapy  Jackelyn Knife 12/02/2012, 4:31 PM

## 2012-12-02 NOTE — Progress Notes (Signed)
Speech Language Pathology Daily Session Note  Patient Details  Name: Walter Frazier MRN: [id number] Date of Birth: 1955/11/21  Today's Date: 12/02/2012 Time: 1130-1150 Time Calculation (min): 20 min  Short Term Goals: Week 1: SLP Short Term Goal 1 (Week 1): Pt will utilize swallowing compensatory strategies with supervision verbal cues to minimize overt s/s of aspiration. SLP Short Term Goal 1 - Progress (Week 1): Progressing toward goal SLP Short Term Goal 2 (Week 1): Pt will consume trials of thin liquids without overt s/s of aspiration.  SLP Short Term Goal 2 - Progress (Week 1): Progressing toward goal SLP Short Term Goal 3 (Week 1): Pt will utilize external memory aids to recall new, daily information with Mod A verbal and question cues.  SLP Short Term Goal 3 - Progress (Week 1): Progressing toward goal SLP Short Term Goal 4 (Week 1): Pt will utilize call bell to express wants/needs with Mod A verbal and question cues.  SLP Short Term Goal 5 (Week 1): Pt will demonstrate functional problem solving with basic and familiar tasks with Mod A verbal cues.  SLP Short Term Goal 5 - Progress (Week 1): Progressing toward goal SLP Short Term Goal 6 (Week 1): Pt will demonstrate sustained attention to functional tasks for 10 minutes with Mod A verbal cues for redirection.  SLP Short Term Goal 6 - Progress (Week 1): Progressing toward goal  Skilled Therapeutic Interventions: Pt participated in a co-treatment with OT in diners club with focus on cognitive and dysphagia goals. Pt was overall Mod I for functional problem solving with tray set-up but required supervision verbal cues for small bites/sips and to self-monitor and correct his wet vocal quality. Recommend to continue full supervision at meals for utilization of swallowing compensatory strategies.     FIM:  Comprehension Comprehension Mode: Auditory Comprehension: 4-Understands basic 75 - 89% of the time/requires cueing 10 - 24% of the  time Expression Expression Mode: Verbal Expression: 4-Expresses basic 75 - 89% of the time/requires cueing 10 - 24% of the time. Needs helper to occlude trach/needs to repeat words. Social Interaction Social Interaction: 5-Interacts appropriately 90% of the time - Needs monitoring or encouragement for participation or interaction. Problem Solving Problem Solving: 4-Solves basic 75 - 89% of the time/requires cueing 10 - 24% of the time Memory Memory: 3-Recognizes or recalls 50 - 74% of the time/requires cueing 25 - 49% of the time FIM - Eating Eating Activity: 5: Supervision/cues  Pain Pain Assessment Pain Assessment: No/denies pain  Therapy/Group: Group Therapy  [name] 12/02/2012, 1:09 PM

## 2012-12-02 NOTE — Progress Notes (Signed)
Social Work Patient ID: Walter Frazier, male   DOB: 1955/10/01, 57 y.o.   MRN: [id number]  Met with patient and wife to review team conference.  Both aware and agreeable with targeted d/c date of 12/04/12.  Both very pleased with gains overall.  Wife denies any concerns about being able to meet his care needs at home.  Agreeable with OP tx follow up.    Walter Traum, LCSW

## 2012-12-02 NOTE — Progress Notes (Signed)
Patient ID: Walter Frazier, male   DOB: 29-Sep-1955, 57 y.o.   MRN: [id number] Subjective/Complaints: No complaints. Sleep is improving A 12 point review of systems has been performed and if not noted above is otherwise negative.   Objective: Vital Signs: Blood pressure 99/74, pulse 85, temperature 98.1 F (36.7 C), temperature source Oral, resp. rate 18, height 5\' 7"  (1.702 m), weight 70.5 kg (155 lb 6.8 oz), SpO2 99.00%. No results found. No results found for this basename: WBC, HGB, HCT, PLT,  in the last 72 hours  Recent Labs  12/02/12 0535  CREATININE 0.76   CBG (last 3)  No results found for this basename: GLUCAP,  in the last 72 hours  Wt Readings from Last 3 Encounters:  11/27/12 70.5 kg (155 lb 6.8 oz)  11/25/12 69.3 kg (152 lb 12.5 oz)    Physical Exam:  Constitutional: He appears well-developed and well-nourished. No distress. Sitting at table eating breakfast  HENT: oral mucosa pink and moist  Head: Normocephalic and atraumatic.  Lungs clear to auscultation  Heart regular rate and rhythm no rubs murmurs or extra sounds  Abdomen positive bowel sounds soft nontender palpation  Trach site healing, with closing stoma Neurological:  Alert, non-agitated.   Oriented to self, place with cueing. Persistent delay processing with simple and more complex tasks, but showing improvement.  Assessment/Plan: 1. 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 Physiatrist is providing close team supervision and 24 hour management of active medical problems listed below. Physiatrist and rehab team continue to assess barriers to discharge/monitor patient progress toward functional and medical goals.    FIM: FIM - Bathing Bathing Steps Patient Completed: Chest;Right Arm;Left Arm;Abdomen;Front perineal area;Buttocks;Right upper leg;Left upper leg;Right lower leg  (including foot);Left lower leg (including foot) Bathing: 5: Set-up assist to: Adjust water temp (stood for the whole shower)  FIM - Upper Body Dressing/Undressing Upper body dressing/undressing steps patient completed: Thread/unthread right sleeve of pullover shirt/dresss;Pull shirt over trunk;Put head through opening of pull over shirt/dress;Thread/unthread left sleeve of pullover shirt/dress Upper body dressing/undressing: 5: Supervision: Safety issues/verbal cues FIM - Lower Body Dressing/Undressing Lower body dressing/undressing steps patient completed: Thread/unthread right pants leg;Thread/unthread left pants leg;Pull pants up/down;Thread/unthread right underwear leg;Thread/unthread left underwear leg;Pull underwear up/down;Don/Doff right sock;Don/Doff left sock;Don/Doff left shoe;Don/Doff right shoe (elected to go barefoot) Lower body dressing/undressing: 5: Set-up assist to: Don/Doff TED stocking  FIM - Toileting Toileting steps completed by patient: Adjust clothing prior to toileting;Performs perineal hygiene;Adjust clothing after toileting Toileting: 5: Supervision: Safety issues/verbal cues  FIM - Diplomatic Services operational officer Devices: Grab bars Toilet Transfers: 7-To toilet/BSC  FIM - Banker Devices: Bed rails Bed/Chair Transfer: 5: Chair or W/C > Bed: Supervision (verbal cues/safety issues);5: Bed > Chair or W/C: Supervision (verbal cues/safety issues)  FIM - Locomotion: Wheelchair Distance: 70 Locomotion: Wheelchair: 1: Total Assistance/staff pushes wheelchair (Pt<25%) FIM - Locomotion: Ambulation Ambulation/Gait Assistance: 4: Min guard Locomotion: Ambulation: 5: Travels 150 ft or more with supervision/safety issues  Comprehension Comprehension Mode: Auditory Comprehension: 4-Understands basic 75 - 89% of the time/requires cueing 10 - 24% of the time  Expression Expression Mode: Verbal Expression: 4-Expresses  basic 75 - 89% of the time/requires cueing 10 - 24% of the time. Needs helper to occlude trach/needs to repeat words.  Social Interaction Social Interaction: 5-Interacts appropriately 90% of the time - Needs monitoring or encouragement for participation  or interaction.  Problem Solving Problem Solving: 4-Solves basic 75 - 89% of the time/requires cueing 10 - 24% of the time  Memory Memory: 2-Recognizes or recalls 25 - 49% of the time/requires cueing 51 - 75% of the time  Medical Problem List and Plan:  1. Severe deconditioning/anoxic encephalopathy after cardiac arrest/postop VDRF  2. DVT Prophylaxis/Anticoagulation: Subcutaneous Lovenox. Monitor platelet counts and any signs of bleeding  3. Pain Management: Tylenol as needed. Monitor with increased mobility  4. Mood: continue sleep chart  -increased seroquel to 150mg  qhs with earlier time, which seems to have helped. 5. Neuropsych: This patient is not capable of making decisions on his own behalf.  6. Dysphagia. Dysphagia 3 nectar thick liquids. Followup speech therapy and monitor for any signs of aspiration  7. seizure disorder. Dilantin 200 mg twice a day, valproic acid 500 mg twice a day.  Monitor for any signs of seizure activity. Drug levels remain low. Increased dosing last week and recheck tomorrow 8. Hypertension. Coreg 9.375 mg twice a day, Vasotec 2.5 mg twice a day, Aldactone 12.5 mg daily. Monitor with increased activity  9. Tobacco abuse. Counseling  10. Hyperlipidemia. Lipitor    LOS (Days) 7 A FACE TO FACE EVALUATION WAS PERFORMED  SWARTZ,ZACHARY T 12/02/2012 7:44 AM

## 2012-12-02 NOTE — Patient Care Conference (Signed)
Inpatient RehabilitationTeam Conference and Plan of Care Update Date: 12/02/2012   Time: 2:45 PM    Patient Name: Walter Frazier      Medical Record Number: [id number]  Date of Birth: [birth year] Sex: Male         Room/Bed: 4027/4027-01 Payor Info: Payor: BLUE CROSS BLUE SHIELD / Plan: [employer] PPO / Product Type: *No Product type* /    Admitting Diagnosis: ANOXIC BI  Admit Date/Time:  11/25/2012  7:29 PM Admission Comments: No comment available   Primary Diagnosis:  Anoxic brain damage Principal Problem: Anoxic brain damage  Patient Active Problem List   Diagnosis Date Noted  . Anoxic brain damage 11/26/2012  . Acute systolic heart failure 11/23/2012  . UTI (lower urinary tract infection) 11/14/2012  . 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 Status epilepticus 11/03/2012  . Cardiac arrest 10/31/2012  . STEMI (ST elevation myocardial infarction) 10/31/2012  . V-tach 10/31/2012  . Acute respiratory failure with hypoxia 10/31/2012  . Encephalopathy acute 10/31/2012    Expected Discharge Date: Expected Discharge Date: 12/04/12  Team Members Present: Physician leading conference: Dr. Faith Rogue Social Worker Present: Amada Jupiter, LCSW Nurse Present: Chana Bode, RN PT Present: Reggy Eye, PT OT Present: Ardis Rowan, Sherryl Manges, OT SLP Present: Feliberto Gottron, SLP Other (Discipline and Name): Tora Duck, PPS Coordinator     Current Status/Progress Goal Weekly Team Focus  Medical   cardiac arrest with anoxic bi, showing progress but persistent cognitive deficits, trach site closing  maintain bp, cv parameters to allow participation in our program.   re-establish sleep wake, improve behavior   Bowel/Bladder   continent bowel/bladder         Swallow/Nutrition/ Hydration   Dys. 3 textures with nectar-thick liquids, supervision  Supervision with least resrictive diet  MBSS to assess for possible upgrade   ADL's   supervision  supervision overall  family education  d/c   Mobility   supervision  supervision  family ed   Communication             Safety/Cognition/ Behavioral Observations  Mod A  Min A  memory, problem solving, awareness, safety awareness   Pain   denies pain  monitor for pain and medicate prn      Skin   intact  keep skin from breakdown       Rehab Goals Patient on target to meet rehab goals: Yes *See Care Plan and progress notes for long and short-term goals.  Barriers to Discharge: behavior, reduced safety awareness, reduced memory    Possible Resolutions to Barriers:  family education, NMR, CPT    Discharge Planning/Teaching Needs:  home with wife who can provide 24/7 supervision      Team Discussion:  Making excellent gains at faster rate than originally anticipated.  MBS tomorrow.  Supervision - min goals.  Revisions to Treatment Plan:  None   Continued Need for Acute Rehabilitation Level of Care: The patient requires daily medical management by a physician with specialized training in physical medicine and rehabilitation for the following conditions: Daily direction of a multidisciplinary physical rehabilitation program to ensure safe treatment while eliciting the highest outcome that is of practical value to the patient.: Yes Daily medical management of patient stability for increased activity during participation in an intensive rehabilitation regime.: Yes Daily analysis of laboratory values and/or radiology reports with any subsequent need for medication adjustment of medical intervention for : Neurological problems;Post surgical problems  Keslee Harrington 12/02/2012, 5:55 PM

## 2012-12-02 NOTE — Progress Notes (Signed)
Speech Language Pathology Daily Session Note  Patient Details  Name: Walter Frazier MRN: [id number] Date of Birth: [birth year]  Today's Date: 12/02/2012 Time: 0800-0845 Time Calculation (min): 45 min  Short Term Goals: Week 1: SLP Short Term Goal 1 (Week 1): Pt will utilize swallowing compensatory strategies with supervision verbal cues to minimize overt s/s of aspiration. SLP Short Term Goal 1 - Progress (Week 1): Progressing toward goal SLP Short Term Goal 2 (Week 1): Pt will consume trials of thin liquids without overt s/s of aspiration.  SLP Short Term Goal 2 - Progress (Week 1): Progressing toward goal SLP Short Term Goal 3 (Week 1): Pt will utilize external memory aids to recall new, daily information with Mod A verbal and question cues.  SLP Short Term Goal 3 - Progress (Week 1): Progressing toward goal SLP Short Term Goal 4 (Week 1): Pt will utilize call bell to express wants/needs with Mod A verbal and question cues.  SLP Short Term Goal 5 (Week 1): Pt will demonstrate functional problem solving with basic and familiar tasks with Mod A verbal cues.  SLP Short Term Goal 5 - Progress (Week 1): Progressing toward goal SLP Short Term Goal 6 (Week 1): Pt will demonstrate sustained attention to functional tasks for 10 minutes with Mod A verbal cues for redirection.  SLP Short Term Goal 6 - Progress (Week 1): Progressing toward goal  Skilled Therapeutic Interventions: Treatment focus on dysphagia and cognitive goals. SLP facilitated session by providing supervision verbal cues for utilization of swallowing compensatory strategies.  Pt continues to demonstrate overt s/s of aspiration with inconsistent throat clear with nectar-thick and thin liquids, therefore recommend MBS to objectively evaluate swallow function and assess for safest diet. Pt in agreement.  SLP also facilitated session by providing Mod question cues to recall functions of current medications and for basic money management.  Pt continues to demonstrate inconsistent emergent awareness of cognitive deficits.    FIM:  Comprehension Comprehension Mode: Auditory Comprehension: 4-Understands basic 75 - 89% of the time/requires cueing 10 - 24% of the time Expression Expression: 4-Expresses basic 75 - 89% of the time/requires cueing 10 - 24% of the time. Needs helper to occlude trach/needs to repeat words. Social Interaction Social Interaction: 5-Interacts appropriately 90% of the time - Needs monitoring or encouragement for participation or interaction. Problem Solving Problem Solving: 3-Solves basic 50 - 74% of the time/requires cueing 25 - 49% of the time Memory Memory: 3-Recognizes or recalls 50 - 74% of the time/requires cueing 25 - 49% of the time FIM - Eating Eating Activity: 5: Supervision/cues  Pain Pain Assessment Pain Assessment: No/denies pain  Therapy/Group: Individual Therapy  Anselm Aumiller 12/02/2012, 9:07 AM

## 2012-12-03 ENCOUNTER — Inpatient Hospital Stay (HOSPITAL_COMMUNITY): Payer: BC Managed Care – PPO | Admitting: Physical Therapy

## 2012-12-03 ENCOUNTER — Inpatient Hospital Stay (HOSPITAL_COMMUNITY): Payer: BC Managed Care – PPO | Admitting: Speech Pathology

## 2012-12-03 ENCOUNTER — Inpatient Hospital Stay (HOSPITAL_COMMUNITY): Payer: BC Managed Care – PPO

## 2012-12-03 ENCOUNTER — Inpatient Hospital Stay (HOSPITAL_COMMUNITY): Payer: BC Managed Care – PPO | Admitting: Occupational Therapy

## 2012-12-03 DIAGNOSIS — G931 Anoxic brain damage, not elsewhere classified: Secondary | ICD-10-CM

## 2012-12-03 DIAGNOSIS — J9600 Acute respiratory failure, unspecified whether with hypoxia or hypercapnia: Secondary | ICD-10-CM

## 2012-12-03 DIAGNOSIS — I469 Cardiac arrest, cause unspecified: Secondary | ICD-10-CM

## 2012-12-03 LAB — COMPREHENSIVE METABOLIC PANEL
ALT: 20 U/L (ref 0–53)
Alkaline Phosphatase: 114 U/L (ref 39–117)
BUN: 8 mg/dL (ref 6–23)
CO2: 26 mEq/L (ref 19–32)
Calcium: 8.1 mg/dL — ABNORMAL LOW (ref 8.4–10.5)
GFR calc Af Amer: >90 mL/min (ref >90)
GFR calc non Af Amer: >90 mL/min (ref >90)
Glucose, Bld: 102 mg/dL — ABNORMAL HIGH (ref 70–99)
Sodium: 137 mEq/L (ref 135–145)
Total Protein: 6 g/dL (ref 6.0–8.3)

## 2012-12-03 LAB — CBC
HCT: 35.4 % — ABNORMAL LOW (ref 39.0–52.0)
Hemoglobin: 11.8 g/dL — ABNORMAL LOW (ref 13.0–17.0)
MCH: 31 pg (ref 26.0–34.0)
MCHC: 33.3 g/dL (ref 30.0–36.0)
RBC: 3.81 MIL/uL — ABNORMAL LOW (ref 4.22–5.81)

## 2012-12-03 MED ORDER — PHENYTOIN SODIUM EXTENDED 100 MG PO CAPS
200.0000 mg | ORAL_CAPSULE | Freq: Every day | ORAL | Status: DC
  Administered 2012-12-04: 200 mg via ORAL
  Filled 2012-12-03 (×3): qty 2

## 2012-12-03 MED ORDER — PHENYTOIN SODIUM EXTENDED 100 MG PO CAPS
300.0000 mg | ORAL_CAPSULE | Freq: Every day | ORAL | Status: DC
  Administered 2012-12-03: 300 mg via ORAL
  Filled 2012-12-03 (×2): qty 3

## 2012-12-03 NOTE — Progress Notes (Signed)
Occupational Therapy Discharge Summary  Patient Details  Name: Walter Frazier MRN: [id number] Date of Birth: 04/23/1956  Today's Date: 12/03/2012 Time: 0730-0830 Time Calculation (min): 60 min  Grad day: 1:1 engaged in self care retraining at shower level with focus on obtaining and gathering all items needed, simple problem solving for logical order with min questioning cues for order. Pt with improved balance and balance reactions; increased safety and ability to dress LE in standing with extra time and supervision. Pt participated in getting pt's breakfast ready and eating it managing swallowing techniques.  Wife not present for ADL session but did discuss home safety recommendations including no driving, 24 hr supervision for cognition with basic ADLs.  Patient has met 13 of 13 long term goals due to improved activity tolerance, improved balance, postural control, ability to compensate for deficits, functional use of  RIGHT upper, RIGHT lower, LEFT upper and LEFT lower extremity, improved attention, improved awareness and improved coordination.  Patient to discharge at overall Supervision level.  Patient's care partner is independent to provide the necessary physical and cognitive assistance at discharge.    Reasons goals not met: n/a  Recommendation:  Patient will benefit from ongoing skilled OT services in outpatient setting to continue to advance functional skills in the area of BADL and Reduce care partner burden.  Equipment: No equipment provided  Reasons for discharge: treatment goals met and discharge from hospital  Patient/family agrees with progress made and goals achieved: Yes  OT Discharge Precautions/Restrictions  Restrictions Weight Bearing Restrictions: No    Vital Signs Therapy Vitals Temp: 98.4 F (36.9 C) Temp src: Oral Pulse Rate: 78 Resp: 18 BP: 102/73 mmHg Patient Position, if appropriate: Sitting Oxygen Therapy SpO2: 100 % O2 Device: None (Room  air) Pain Pain Assessment Pain Assessment: No/denies pain ADL ADL Eating: Set up Grooming: Minimal assistance Where Assessed-Grooming: Sitting at sink Upper Body Bathing: Supervision/safety;Setup Where Assessed-Upper Body Bathing: Sitting at sink Lower Body Bathing: Minimal assistance Where Assessed-Lower Body Bathing: Sitting at sink;Standing at sink Upper Body Dressing: Unable to assess Lower Body Dressing: Moderate assistance Where Assessed-Lower Body Dressing: Sitting at sink;Standing at sink Where Assessed-Toileting: Teacher, adult education: Minimal Dentist Method: Stand pivot ADL Comments: Pt with delayed processing, difficulty with following commands, and noted motor perseveration and ideational apraxia Vision/Perception  Vision - History Baseline Vision: Wears glasses only for reading Patient Visual Report: No change from baseline Vision - Assessment Eye Alignment: Within Functional Limits Vision Assessment: Vision tested Ocular Range of Motion: Within Functional Limits Tracking/Visual Pursuits: Decreased smoothness of horizontal tracking Perception Perception: Within Functional Limits Figure Ground: improved Spatial Orientation: improved with basic ADL tasks Praxis Praxis-Other Comments: improved with basic functional tasks  Cognition Overall Cognitive Status: Impaired/Different from baseline Arousal/Alertness: Awake/alert Orientation Level: Oriented to person;Oriented to place;Oriented to time;Oriented to situation Attention: Focused;Sustained;Selective Focused Attention: Appears intact Sustained Attention: Appears intact Selective Attention: Impaired Selective Attention Impairment: Functional basic Memory: Impaired Memory Impairment: Storage deficit;Decreased recall of new information;Decreased short term memory Decreased Short Term Memory: Functional basic Awareness: Impaired Awareness Impairment: Emergent impairment;Anticipatory  impairment Problem Solving: Impaired Problem Solving Impairment: Functional complex Reasoning: Impaired Reasoning Impairment: Functional complex Organizing: Impaired Organizing Impairment: Functional basic;Functional complex Behaviors: Impulsive Safety/Judgment: Impaired Sensation Sensation Light Touch: Appears Intact Hot/Cold: Appears Intact Coordination Gross Motor Movements are Fluid and Coordinated: Yes Fine Motor Movements are Fluid and Coordinated: Yes Motor  Motor Motor: Within Functional Limits Mobility  Bed Mobility Rolling Right: 6: Modified independent (Device/Increase time) Supine  to Sit: 6: Modified independent (Device/Increase time) Transfers Sit to Stand: 5: Supervision Stand to Sit: 5: Supervision  Trunk/Postural Assessment  Cervical Assessment Cervical Assessment: Within Functional Limits Thoracic Assessment Thoracic Assessment: Within Functional Limits Lumbar Assessment Lumbar Assessment: Within Functional Limits Postural Control Postural Control: Within Functional Limits  Balance Static Standing Balance Static Standing - Level of Assistance: 6: Modified independent (Device/Increase time) Extremity/Trunk Assessment RUE Assessment RUE Assessment: Within Functional Limits LUE Assessment LUE Assessment: Within Functional Limits  See FIM for current functional status  Roney Mans [facility] 12/03/2012, 8:06 AM

## 2012-12-03 NOTE — Progress Notes (Signed)
Speech Language Pathology Daily Session Note  Patient Details  Name: Walter Frazier MRN: [id number] Date of Birth: 09-Jan-1956  Today's Date: 12/03/2012 Time: 1130-1145 Time Calculation (min): 15 min  Short Term Goals: Week 1: SLP Short Term Goal 1 (Week 1): Pt will utilize swallowing compensatory strategies with supervision verbal cues to minimize overt s/s of aspiration. SLP Short Term Goal 1 - Progress (Week 1): Progressing toward goal SLP Short Term Goal 2 (Week 1): Pt will consume trials of thin liquids without overt s/s of aspiration.  SLP Short Term Goal 2 - Progress (Week 1): Progressing toward goal SLP Short Term Goal 3 (Week 1): Pt will utilize external memory aids to recall new, daily information with Mod A verbal and question cues.  SLP Short Term Goal 3 - Progress (Week 1): Progressing toward goal SLP Short Term Goal 4 (Week 1): Pt will utilize call bell to express wants/needs with Mod A verbal and question cues.  SLP Short Term Goal 5 (Week 1): Pt will demonstrate functional problem solving with basic and familiar tasks with Mod A verbal cues.  SLP Short Term Goal 5 - Progress (Week 1): Progressing toward goal SLP Short Term Goal 6 (Week 1): Pt will demonstrate sustained attention to functional tasks for 10 minutes with Mod A verbal cues for redirection.  SLP Short Term Goal 6 - Progress (Week 1): Progressing toward goal  Skilled Therapeutic Interventions: Pt participated in a co-treatment with OT in diners club with focus on cognitive and dysphagia goals. Pt was overall Mod I for functional problem solving with tray set-up and for utilization of swallowing compensatory strategies with upgraded meal of regular textures and thin liquids. Pt with intermittent throat clearing, however, suspect it is not related to meal consumption per MBS today.    FIM:  FIM - Eating Eating Activity: 6: Swallowing techniques: self-managed  Pain Pain Assessment Pain Assessment: No/denies  pain  Therapy/Group: Group Therapy  Isac Lincks 12/03/2012, 5:38 PM

## 2012-12-03 NOTE — Progress Notes (Signed)
Occupational Therapy Note  Patient Details  Name: DANGELO GUZZETTA MRN: [id number] Date of Birth: 1955-09-15 Today's Date: 12/03/2012  Time: 1145-1200 Pt denies pain Group Therapy Pt participated in self feeding group with focus on attention to task, appropriate social interaction, and portion control.  Pt required supervision throughout the meal.   Rich Brave 12/03/2012, 3:41 PM

## 2012-12-03 NOTE — Procedures (Signed)
Objective Swallowing Evaluation: Modified Barium Swallowing Study  Patient Details  Name: Walter Frazier MRN: [id number] Date of Birth: 1956/03/03  Today's Date: 12/03/2012 Time: 0900-0930 Time Calculation (min): 30 min  Past Medical History: No past medical history on file. Past Surgical History:  Past Surgical History  Procedure Laterality Date  . Back surgery      ~20 years ago by Dr. Jeral Fruit   HPI:  57 y/o male smoker with no known past medical history was admitted to [facility] on 5/29 after cardiac arrest from a STEMI. 5/29 started amiodarone and underwent LHC > stent to prox LAD and IABP placed.  He was intubated and comatose afterwards and was started on the hypothermia protocol. He transferred to [facility] for further care on same day.  After re-warming, noted to be myoclonic requiring further sedation. 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 He improved with ativan, Dilantin and then Keppra. Final Seizure regimen will be Valproate and Dilantin. Extubation was attempted but he failed due to agitation. He was reintubated and then underwent trach placement 6/10. He was treated for E coli UTI with ceftriaxone 6/11 - 6/14. Post trach placement his MS improved although he still showed some disorientation and confusion. Transferred to SDU on 6/13, trach downsized on 6/18; MBS 6/18 - dysphagia 3, nectar-thick liquids; decannulated 6/20. Admitted to CIR to address severe deconditioning/anoxic encephalopathy.  Pt has been tolerating a dysphagia 3 diet with nectars - plan is for D/C home tomorrow.       Recommendation/Prognosis  Clinical Impression Dysphagia Diagnosis: Within Functional Limits  Clinical impression:  Dysphagia has resolved: pt presents with normal mastication; timely swallow trigger; strong pharyngeal peristalsis; excellent mobility of hyolaryngeal complex; no penetration nor aspiration of any tested consistences.  Recommend advancing diet to regular with thin  liquids; meds whole with thin liquids.  (Note: Pt cleared throat throughout procedure - this was not associated with laryngeal penetration/aspiration.)    Swallow Evaluation Recommendations Diet Recommendations: Regular;Thin liquid Liquid Administration via: Cup;Straw Medication Administration: Whole meds with liquid Supervision: Patient able to self feed Postural Changes and/or Swallow Maneuvers: Seated upright 90 degrees Oral Care Recommendations: Oral care BID   Individuals Consulted Consulted and Agree with Results and Recommendations: Patient;Patient unable/family or caregiver not available Family Member Consulted:  (wife not present for education)     General:  Type of Study: Modified Barium Swallowing Study Reason for Referral: Objectively evaluate swallowing function Previous Swallow Assessment: MBS on 11/19/12 and recommended Dys. 3 textures with nectar thick liquids Diet Prior to this Study: Dysphagia 3 (soft);Nectar-thick liquids Temperature Spikes Noted: No Respiratory Status: Room air History of Recent Intubation: Yes Length of Intubations (days): 12 days Date extubated: 11/10/12 Behavior/Cognition: Alert;Cooperative Oral Cavity - Dentition: Adequate natural dentition Oral Motor / Sensory Function: Within functional limits Self-Feeding Abilities: Able to feed self Patient Positioning: Upright in chair Baseline Vocal Quality: Clear Volitional Cough: Strong Volitional Swallow: Able to elicit Anatomy:  (likely osteophytes C5-6, C6-7 not confirmed by radiologist) Pharyngeal Secretions: Not observed secondary MBS  Reason for Referral:  Objectively evaluate swallowing function   Oral Phase Oral Preparation/Oral Phase Oral Phase: WFL Pharyngeal Phase  Pharyngeal Phase Pharyngeal Phase: Within functional limits Cervical Esophageal Phase  Cervical Esophageal Phase Cervical Esophageal Phase: [name]  [name] [name] 12/03/2012, 9:51 AM

## 2012-12-03 NOTE — Discharge Summary (Signed)
  Discharge summary job [id number]

## 2012-12-03 NOTE — Discharge Summary (Signed)
Walter Frazier, Walter NO.:  [id number]  MEDICAL RECORD NO.:  [id number]  LOCATION:  4027                         FACILITY:  [facility]  PHYSICIAN:  Ranelle Oyster, M.D.DATE OF BIRTH:  02-17-1956  DATE OF ADMISSION:  11/25/2012 DATE OF DISCHARGE:  12/04/2012                              DISCHARGE SUMMARY   DISCHARGE DIAGNOSES: 1. Severe deconditioning with anoxic encephalopathy after cardiac     arrest. 2. Lovenox for deep vein thrombosis prophylaxis. 3. Mood. 4. Dysphagia. 5. Seizure disorder. 6. Hypertension. 7. Tobacco abuse. 8. Hyperlipidemia.  HISTORY OF PRESENT ILLNESS:  This is a 57 year old right-handed white male with history of tobacco abuse, who had out of hospital cardiac arrest, was taken to  [facility].  He was taken to cardiac catheterization lab, given ST elevation on EKG in setting of cardiac arrest with findings of proximal LAD occlusion treated with atherectomy and DES to proximal LAD.  Echocardiogram with ejection fraction 25% with severely dilated LV.  Postprocedure comatose with acute respiratory failure requiring intubation and treated with hypothermia protocol.  He was noted to have posturing post warming and Neurology consulted due to concern for hypoxic injury.  Cranial CT scan without acute changes.  EEG consistent with nonconvulsive status epilepticus, and patient started on Dilantin therapy as well as valproic acid.  Bouts of agitation and restlessness. He was provided with a sitter for safety.  He failed extubation require tracheostomy November 11, 2012, and later decannulated November 21, 2012.  No current plan for life vest at this time, as the patient is a do not resuscitate.  The patient has difficulty following commands, advancing his feet.  Modified barium swallow November 19, 2012, started on a mechanical soft, nectar thick liquid diet.  Physical occupational therapy  ongoing.  The patient was admitted for  comprehensive rehab program.  PAST MEDICAL HISTORY:  See discharge diagnoses.  SOCIAL HISTORY:  Lives with family.  FUNCTIONAL HISTORY:  Prior to admission independent, working full time. Functional status upon admission to rehab services was +2 total assist for stand pivot transfers ambulating 140 feet with +2 total assist.  PHYSICAL EXAMINATION:  VITAL SIGNS:  Blood pressure 124/85, pulse 85, temperature 97, respirations 21. GENERAL:  This was an alert, pleasant male some perseveration behavior noted.  Oriented to self, place, and month.  He could not remember the year.  He was able to name the President of the [place] with some cueing.  He was easily distracted with delayed processing with simple and more complex tasks.  Pupils reactive to light. CARDIAC:  Rate controlled. ABDOMEN:  Soft, nontender.  Good bowel sounds.  Tracheostomy site with dressing in place.  REHABILITATION HOSPITAL COURSE:  The patient was admitted to inpatient rehab services with therapies initiated on a 3-hour daily basis consisting of physical therapy, occupational therapy, speech therapy, and rehabilitation nursing.  The following issues were addressed during the patient's rehabilitation stay.  Pertaining to Mr. Redder severe deconditioning related to cardiac arrest, anoxic encephalopathy.  The patient continued to progress nicely with functional mobility and fatigue factors continued to improve.  Cranial CT scan showed no acute abnormalities.  He remained on subcutaneous Lovenox for DVT  prophylaxis. The mood sleep cycles variable although these did continue to improve. He remained on Seroquel at bedtime with good results.  Followup speech therapy for dysphagia.  His diet was slowly advanced from a mechanical soft, nectar thick liquid diet.  Noted seizure disorder remaining on Dilantin and valproic acid.  He had no seizure activity during his rehab stay.  Blood pressures controlled on Coreg  as well as Vasotec with no orthostatic changes.  In relation to the patient's cardiac arrest, he would follow up with Cardiology Services, Dr. Graciela Husbands as an outpatient. No life vest was initiated as patient was a do not resuscitate.  He did have a history of tobacco abuse, received full counseling in relation to cessation of nicotine products.  It was questionable he would be compliant with these request.  The patient received weekly collaborative interdisciplinary team conferences to discuss estimated length of stay, family teaching, and any barriers to his discharge.  He was supervised for activities of daily living and functional mobility, needing some cues for safety and awareness.  He was able to interact with staff need some monitoring, encouragement for participation at times.  75%-89% for basic problem solving.  Full family teaching was completed prior to discharge to home.  DISCHARGE MEDICATIONS:  Aspirin 81 mg p.o. daily.  Lipitor 80 mg p.o. daily.  Coreg 6.25 mg p.o. b.i.d., Plavix 75 mg p.o. daily Vasotec 2.5 mg p.o. daily, Dilantin 200 mg daily, 300 mg at bedtime.  Seroquel 150 mg p.o. at bedtime, Aldactone 12.5 mg p.o. daily, Valproic acid 750 mg p.o. b.i.d.  DIET:  Currently mechanical soft with nectar liquids, advance as tolerated.  He would follow up with Dr. Faith Rogue at the outpatient rehab service office as directed.  Dr. Sherryl Manges, Cardiology Services in 2 weeks.  Special instructions, the patient was advised no driving or smoking or alcohol.     Mariam Dollar, P.A.   ______________________________ Ranelle Oyster, M.D.    DA/MEDQ  D:  12/03/2012  T:  12/03/2012  Job:  [id number]  cc:   Duke Salvia, MD, [facility]

## 2012-12-03 NOTE — Progress Notes (Signed)
Patient ID: Walter Frazier, male   DOB: 08-23-1955, 57 y.o.   MRN: [id number] Subjective/Complaints: No complaints. Up walking around when I found him this am. Feeling well. Slight air leakage from trach site still. A 12 point review of systems has been performed and if not noted above is otherwise negative.   Objective: Vital Signs: Blood pressure 102/73, pulse 78, temperature 98.4 F (36.9 C), temperature source Oral, resp. rate 18, height 5\' 7"  (1.702 m), weight 70.5 kg (155 lb 6.8 oz), SpO2 100.00%. No results found.  Recent Labs  12/03/12 0610  WBC 10.4  HGB 11.8*  HCT 35.4*  PLT 269    Recent Labs  12/02/12 0535 12/03/12 0610  NA  --  137  K  --  4.1  CL  --  102  GLUCOSE  --  102*  BUN  --  8  CREATININE 0.76 0.77  CALCIUM  --  8.1*   CBG (last 3)  No results found for this basename: GLUCAP,  in the last 72 hours  Wt Readings from Last 3 Encounters:  11/27/12 70.5 kg (155 lb 6.8 oz)  11/25/12 69.3 kg (152 lb 12.5 oz)    Physical Exam:  Constitutional: He appears well-developed and well-nourished. No distress. Sitting at table eating breakfast  HENT: oral mucosa pink and moist  Head: Normocephalic and atraumatic.  Lungs clear to auscultation  Heart regular rate and rhythm no rubs murmurs or extra sounds  Abdomen positive bowel sounds soft nontender palpation  Trach site healing, with closing stoma Neurological:  Alert, non-agitated.   Oriented to self, place with cueing. Persistent delay processing with simple and more complex tasks, but showing improvement.  Assessment/Plan: 1. 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 Physiatrist is providing close team supervision and 24 hour management of active medical problems listed below. Physiatrist and rehab team continue to assess barriers to discharge/monitor patient progress toward  functional and medical goals.  Pt has made remarkable progress. Finalize dc planning for tomorrow.   FIM: FIM - Bathing Bathing Steps Patient Completed: Chest;Right Arm;Left Arm;Abdomen;Front perineal area;Buttocks;Right upper leg;Left upper leg;Right lower leg (including foot);Left lower leg (including foot) Bathing: 5: Set-up assist to: Adjust water temp  FIM - Upper Body Dressing/Undressing Upper body dressing/undressing steps patient completed: Thread/unthread right sleeve of pullover shirt/dresss;Pull shirt over trunk;Put head through opening of pull over shirt/dress;Thread/unthread left sleeve of pullover shirt/dress Upper body dressing/undressing: 5: Supervision: Safety issues/verbal cues FIM - Lower Body Dressing/Undressing Lower body dressing/undressing steps patient completed: Thread/unthread right pants leg;Thread/unthread left pants leg;Pull pants up/down;Thread/unthread right underwear leg;Thread/unthread left underwear leg;Pull underwear up/down;Don/Doff right sock;Don/Doff left sock;Don/Doff left shoe;Don/Doff right shoe Lower body dressing/undressing: 5: Supervision: Safety issues/verbal cues  FIM - Toileting Toileting steps completed by patient: Adjust clothing prior to toileting;Performs perineal hygiene;Adjust clothing after toileting Toileting: 5: Supervision: Safety issues/verbal cues  FIM - Archivist Transfers Assistive Devices: Grab bars Toilet Transfers: 5-From toilet/BSC: Supervision (verbal cues/safety issues);5-To toilet/BSC: Supervision (verbal cues/safety issues)  FIM - Banker Devices: Bed rails Bed/Chair Transfer: 5: Chair or W/C > Bed: Supervision (verbal cues/safety issues);5: Bed > Chair or W/C: Supervision (verbal cues/safety issues)  FIM - Locomotion: Wheelchair Distance: 70 Locomotion: Wheelchair: 1: Total Assistance/staff pushes wheelchair (Pt<25%) FIM - Locomotion: Ambulation Ambulation/Gait  Assistance: 4: Min guard Locomotion: Ambulation: 5: Travels 150 ft or more with supervision/safety issues  Comprehension  Comprehension Mode: Auditory Comprehension: 4-Understands basic 75 - 89% of the time/requires cueing 10 - 24% of the time  Expression Expression Mode: Verbal Expression: 4-Expresses basic 75 - 89% of the time/requires cueing 10 - 24% of the time. Needs helper to occlude trach/needs to repeat words.  Social Interaction Social Interaction: 5-Interacts appropriately 90% of the time - Needs monitoring or encouragement for participation or interaction.  Problem Solving Problem Solving: 4-Solves basic 75 - 89% of the time/requires cueing 10 - 24% of the time  Memory Memory: 3-Recognizes or recalls 50 - 74% of the time/requires cueing 25 - 49% of the time  Medical Problem List and Plan:  1. Severe deconditioning/anoxic encephalopathy after cardiac arrest/postop VDRF  2. DVT Prophylaxis/Anticoagulation: Subcutaneous Lovenox. Monitor platelet counts and any signs of bleeding  3. Pain Management: Tylenol as needed. Monitor with increased mobility  4. Mood: continue sleep chart  -increased seroquel to 150mg  qhs with earlier time, which seems to be effective- can taper as outpt---would keep him on this dose for now. 5. Neuropsych: This patient is not capable of making decisions on his own behalf.  6. Dysphagia. Dysphagia 3 nectar thick liquids. Followup speech therapy and monitor for any signs of aspiration  7. seizure disorder. Dilantin 200 mg am and 300mg  pm, valproic acid 750 mg twice a day.  Monitor for any signs of seizure activity. Drug levels checked and adjustments made yesterday.  -needs levels rechecked in 2 weeks  -can have meds in pill form upon dc 8. Hypertension. Coreg 9.375 mg twice a day, Vasotec 2.5 mg twice a day, Aldactone 12.5 mg daily. Monitor with increased activity  9. Tobacco abuse. Counseling  10. Hyperlipidemia. Lipitor    LOS (Days) 8 A FACE TO  FACE EVALUATION WAS PERFORMED  Clarrisa Kaylor T 12/03/2012 8:15 AM

## 2012-12-03 NOTE — Progress Notes (Signed)
Physical Therapy Discharge Summary  Patient Details  Name: Walter Frazier MRN: [id number] Date of Birth: 04-03-1956  Today's Date: 12/03/2012 Time:Session #1: [id number] ,Session #2:  [id number] Time Calculation (min): Session #1: 48 min Session #2: 36 min  Patient has met 9 of 9 long term goals due to improved activity tolerance, improved balance, increased strength, improved attention and improved awareness.  Patient to discharge at an ambulatory level Supervision.   Patient's care partner is independent to provide the necessary physical assistance at discharge.  Reasons goals not met: NA  Recommendation:  Patient will benefit from ongoing skilled PT services in outpatient setting to continue to advance safe functional mobility, address ongoing impairments in balance, endurance, gait, and minimize fall risk.  Equipment: No equipment provided  Reasons for discharge: treatment goals met and discharge from hospital  Patient/family agrees with progress made and goals achieved: Yes  PT Discharge Precautions/Restrictions Precautions Precautions: Fall Precaution Comments: visual deficits to the right Restrictions Weight Bearing Restrictions: No Vital Signs Therapy Vitals Temp: 98 F (36.7 C) Temp src: Oral Pulse Rate: 72 Resp: 18 BP: 94/60 mmHg Patient Position, if appropriate: Lying Oxygen Therapy SpO2: 100 % O2 Device: None (Room air)   Vision/Perception  Vision - History Baseline Vision: Wears glasses only for reading Patient Visual Report: No change from baseline (per pt report)  Cognition Overall Cognitive Status: Impaired/Different from baseline Arousal/Alertness: Awake/alert Orientation Level: Oriented to person;Oriented to place;Oriented to situation Attention: Focused;Sustained;Selective Focused Attention: Appears intact Memory: Impaired Memory Impairment: Decreased short term memory;Storage deficit;Decreased recall of new information Decreased Short Term  Memory: Functional basic Awareness: Impaired Awareness Impairment: Emergent impairment Problem Solving: Impaired Problem Solving Impairment: Functional complex Safety/Judgment: Impaired Comments: Decreased safety awareness requiring cueing.  Decreased ability to problem solve without cueing.   Sensation Sensation Light Touch: Appears Intact Coordination Gross Motor Movements are Fluid and Coordinated: Yes Fine Motor Movements are Fluid and Coordinated: Yes Heel Shin Test: WNL bil Motor  Motor Motor: Within Functional Limits  Mobility Bed Mobility Rolling Right: 6: Modified independent (Device/Increase time) Supine to Sit: 6: Modified independent (Device/Increase time) Transfers Sit to Stand: 5: Supervision Stand to Sit: 5: Supervision Stand Pivot Transfers: 5: Supervision Locomotion  Ambulation Ambulation: Yes Ambulation/Gait Assistance: 5: Supervision Ambulation Distance (Feet): 500 Feet Assistive device: None Ambulation/Gait Assistance Details: Supervision for safety especially in outdoor, uneven surfaces.   Gait Gait: Yes Gait Pattern: Impaired Gait Pattern: Step-through pattern (mildly staggering gait pattern) Gait velocity: 2.93 ft/sec which is independent Air traffic controller / Additional Locomotion Stairs: Yes Stairs Assistance: 5: Supervision Stairs Assistance Details (indicate cue type and reason): supervision for safety Stair Management Technique: Alternating pattern;No rails Number of Stairs: 6 Height of Stairs: 6 Wheelchair Mobility Wheelchair Mobility: Yes Wheelchair Assistance: 5: Financial planner Details: Verbal cues for sequencing;Verbal cues for technique;Verbal cues for Engineer, drilling: Both upper extremities Wheelchair Parts Management: Needs assistance Distance: 75     Balance Balance Balance Assessed: Yes Berg Balance Test Sit to Stand: Able to stand without using hands and stabilize  independently Standing Unsupported: Able to stand safely 2 minutes Sitting with Back Unsupported but Feet Supported on Floor or Stool: Able to sit safely and securely 2 minutes Stand to Sit: Sits safely with minimal use of hands Transfers: Able to transfer safely, minor use of hands Standing Unsupported with Eyes Closed: Able to stand 10 seconds safely Standing Ubsupported with Feet Together: Able to place feet together independently and stand 1 minute safely From Standing,  Reach Forward with Outstretched Arm: Can reach confidently >25 cm (10") From Standing Position, Pick up Object from Floor: Able to pick up shoe safely and easily From Standing Position, Turn to Look Behind Over each Shoulder: Looks behind from both sides and weight shifts well Turn 360 Degrees: Able to turn 360 degrees safely but slowly Standing Unsupported, Alternately Place Feet on Step/Stool: Able to stand independently and complete 8 steps >20 seconds Standing Unsupported, One Foot in Front: Loses balance while stepping or standing Standing on One Leg: Tries to lift leg/unable to hold 3 seconds but remains standing independently Total Score: 46 Dynamic Gait Index Level Surface: Normal Change in Gait Speed: Mild Impairment Gait with Horizontal Head Turns: Normal Gait with Vertical Head Turns: Normal Gait and Pivot Turn: Normal Step Over Obstacle: Severe Impairment Step Around Obstacles: Normal Steps: Normal Total Score: 20 Static Sitting Balance Static Sitting - Balance Support: No upper extremity supported;Feet supported Static Sitting - Level of Assistance: 7: Independent Static Standing Balance Static Standing - Balance Support: Right upper extremity supported Static Standing - Level of Assistance: 6: Modified independent (Device/Increase time) Extremity Assessment      RLE Assessment RLE Assessment: Within Functional Limits RLE Strength RLE Overall Strength: Within Functional Limits for tasks  assessed LLE Assessment LLE Assessment: Within Functional Limits LLE Strength LLE Overall Strength: Within Functional Limits for tasks assessed  Skilled Interventions: Session #1: Floor and car transfers completed supervision multiple attempts at both to demonstrate different scenarios (high and low car, with support and without support floor).  Bed mobility transfers and gait overall supervision due to safety and mild imbalance especially on unlevel outdoor surfaces.  Berg completed 46/56 indicating moderate (>50%) falls risk, but does not indicate the need for an assistive device.   Session #2: This session focused on family education with gait, transfers (car and floor) stairs, outdoor/community gait, bed mobility and stairs.  Demonstrated some of the safety/cognitive deficits that pt continues to struggle with (memory, awareness, problem solving).  Wife was present for education.  Questions answered.     See FIM for current functional status  Lurena Joiner B. Nemesio Castrillon, PT, DPT [phone]   12/03/2012, 5:03 PM

## 2012-12-04 ENCOUNTER — Encounter (HOSPITAL_COMMUNITY): Payer: BC Managed Care – PPO | Admitting: Speech Pathology

## 2012-12-04 MED ORDER — SPIRONOLACTONE 12.5 MG HALF TABLET: 12.5000 mg | ORAL_TABLET | Freq: Every day | ORAL | Status: DC

## 2012-12-04 MED ORDER — CLOPIDOGREL BISULFATE 75 MG PO TABS: 75.0000 mg | ORAL_TABLET | Freq: Every day | ORAL | Status: DC

## 2012-12-04 MED ORDER — CARVEDILOL 6.25 MG PO TABS: 6.2500 mg | ORAL_TABLET | Freq: Two times a day (BID) | ORAL | Status: DC

## 2012-12-04 MED ORDER — ATORVASTATIN CALCIUM 80 MG PO TABS: 80.0000 mg | ORAL_TABLET | Freq: Every day | ORAL | Status: DC

## 2012-12-04 MED ORDER — PHENYTOIN SODIUM EXTENDED 200 MG PO CAPS: 200.0000 mg | ORAL_CAPSULE | Freq: Every day | ORAL | Status: DC

## 2012-12-04 MED ORDER — PHENYTOIN SODIUM EXTENDED 300 MG PO CAPS: 300.0000 mg | ORAL_CAPSULE | Freq: Every day | ORAL | Status: DC

## 2012-12-04 MED ORDER — QUETIAPINE FUMARATE 50 MG PO TABS: 150.0000 mg | ORAL_TABLET | Freq: Every day | ORAL | Status: DC

## 2012-12-04 MED ORDER — ASPIRIN 81 MG PO CHEW: 81.0000 mg | CHEWABLE_TABLET | Freq: Every day | ORAL | Status: AC

## 2012-12-04 MED ORDER — ENALAPRIL MALEATE 2.5 MG PO TABS: 2.5000 mg | ORAL_TABLET | Freq: Every day | ORAL | Status: DC

## 2012-12-04 MED ORDER — VALPROIC ACID 250 MG PO CAPS: 750.0000 mg | ORAL_CAPSULE | Freq: Two times a day (BID) | ORAL | Status: DC

## 2012-12-04 NOTE — Progress Notes (Signed)
Patient discharged with all belongings to home with wife at 3. Discharge instructions given and reviewed by D. Anguilli ,PA. Patient and wife verbalized understanding of discharge instructions . Old trach site healed . wanderguard discontinued .           Walter Frazier

## 2012-12-04 NOTE — Progress Notes (Signed)
Speech Language Pathology Session Note & Discharge Summary  Patient Details  Name: Walter Frazier MRN: [id number] Date of Birth: 16-Oct-1955  Today's Date: 12/04/2012 Time: 0900-0920 Time Calculation (min): 20 min  Skilled Therapeutic Intervention: Treatment focus on pt/family education in regards to current swallowing and cognitive function. Pt's wife given handout to reinforce education in regards to strategies to utilize at home to increase working memory, problem solving and safety awareness. Both verbalized and demonstrated understanding.   Patient has met 6 of 6 long term goals.  Patient to discharge at overall Min;Supervision level.   Reasons goals not met: N/A   Clinical Impression/Discharge Summary: Pt has made functional gains and has met 6 of 6 LTG's this admission. Currently, pt is overall Min A-Supervision level for cognitive function with basic and functional tasks in regards to problem solving, emergent awareness, safety awareness, working memory and selective attention.  Pt's verbal expression and overall word-finding also appear [facility] for basic, functional information. Pt is also consuming regular textures with thin liquids without overt s/s of aspiration and is Mod I for utilization of swallowing compensatory strategies. Pt/family education complete.  Pt will discharge home with 24 hour supervision from family. Pt would benefit from f/u outpatient SLP services to maximize cognitive recovery and overall functional independence.   Care Partner:  Caregiver Able to Provide Assistance: Yes  Type of Caregiver Assistance: Physical;Cognitive  Recommendation:  Outpatient SLP;24 hour supervision/assistance  Rationale for SLP Follow Up: Maximize cognitive function and independence;Reduce caregiver burden   Equipment: N/A   Reasons for discharge: Treatment goals met;Discharged from hospital   Patient/Family Agrees with Progress Made and Goals Achieved: Yes   See FIM for current  functional status  [name] 12/04/2012, 9:30 AM

## 2012-12-04 NOTE — Progress Notes (Signed)
Patient ID: Walter Frazier, male   DOB: 1956/03/29, 57 y.o.   MRN: [id number] Subjective/Complaints: Anxious to go home. Up a little last night because he is excited to leave. A 12 point review of systems has been performed and if not noted above is otherwise negative.   Objective: Vital Signs: Blood pressure 100/60, pulse 76, temperature 97.7 F (36.5 C), temperature source Oral, resp. rate 17, height 5\' 7"  (1.702 m), weight 69.582 kg (153 lb 6.4 oz), SpO2 98.00%. Dg Swallowing Func-speech Pathology  12/03/2012   Walter Frazier, CCC-SLP     12/03/2012  9:53 AM Objective Swallowing Evaluation: Modified Barium Swallowing Study   Patient Details  Name: Walter Frazier MRN: [id number] Date of Birth: 21-Jul-1955  Today's Date: 12/03/2012 Time: 0900-0930 Time Calculation (min): 30 min  Past Medical History: No past medical history on file. Past Surgical History:  Past Surgical History  Procedure Laterality Date  . Back surgery      ~20 years ago by Dr. Jeral Fruit   HPI:  57 y/o male smoker with no known past medical history was  admitted to [facility] on 5/29 after cardiac arrest from a STEMI. 5/29  started amiodarone and underwent LHC > stent to prox LAD and IABP  placed.  He was intubated and comatose afterwards and was started  on the hypothermia protocol. He transferred to [facility] for further  care on same day.  After re-warming, noted to be myoclonic  requiring further sedation. 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 He improved with ativan,  Dilantin and then Keppra. Final Seizure regimen will be Valproate  and Dilantin. Extubation was attempted but he failed due to  agitation. He was reintubated and then underwent trach placement  6/10. He was treated for E coli UTI with ceftriaxone 6/11 - 6/14.  Post trach placement his MS improved although he still showed  some disorientation and confusion. Transferred to SDU on 6/13,  trach downsized on 6/18; MBS 6/18 - dysphagia 3,  nectar-thick  liquids; decannulated 6/20. Admitted to CIR to address severe  deconditioning/anoxic encephalopathy.  Pt has been tolerating a  dysphagia 3 diet with nectars - plan is for D/C home tomorrow.       Recommendation/Prognosis  Clinical Impression Dysphagia Diagnosis: Within Functional Limits  Clinical impression:  Dysphagia has resolved: pt presents with  normal mastication; timely swallow trigger; strong pharyngeal  peristalsis; excellent mobility of hyolaryngeal complex; no  penetration nor aspiration of any tested consistences.  Recommend  advancing diet to regular with thin liquids; meds whole with thin  liquids.  (Note: Pt cleared throat throughout procedure - this  was not associated with laryngeal penetration/aspiration.)    Swallow Evaluation Recommendations Diet Recommendations: Regular;Thin liquid Liquid Administration via: Cup;Straw Medication Administration: Whole meds with liquid Supervision: Patient able to self feed Postural Changes and/or Swallow Maneuvers: Seated upright 90  degrees Oral Care Recommendations: Oral care BID   Individuals Consulted Consulted and Agree with Results and Recommendations:  Patient;Patient unable/family or caregiver not available Family Member Consulted:  (wife not present for education)     General:  Type of Study: Modified Barium Swallowing Study Reason for Referral: Objectively evaluate swallowing function Previous Swallow Assessment: MBS on 11/19/12 and recommended Dys.  3 textures with nectar thick liquids Diet Prior to this Study: Dysphagia 3 (soft);Nectar-thick liquids Temperature Spikes Noted: No Respiratory Status: Room air History of Recent Intubation: Yes Length of Intubations (days): 12 days Date extubated: 11/10/12 Behavior/Cognition: Alert;Cooperative Oral Cavity - Dentition:  Adequate natural dentition Oral Motor / Sensory Function: Within functional limits Self-Feeding Abilities: Able to feed self Patient Positioning: Upright in chair Baseline Vocal  Quality: Clear Volitional Cough: Strong Volitional Swallow: Able to elicit Anatomy:  (likely osteophytes C5-6, C6-7 not confirmed by  radiologist) Pharyngeal Secretions: Not observed secondary MBS  Reason for Referral:  Objectively evaluate swallowing function   Oral Phase Oral Preparation/Oral Phase Oral Phase: WFL Pharyngeal Phase  Pharyngeal Phase Pharyngeal Phase: Within functional limits Cervical Esophageal Phase  Cervical Esophageal Phase Cervical Esophageal Phase: [name]  [name] [name] 12/03/2012, 9:51 AM      Recent Labs  12/03/12 0610  WBC 10.4  HGB 11.8*  HCT 35.4*  PLT 269    Recent Labs  12/02/12 0535 12/03/12 0610  NA  --  137  K  --  4.1  CL  --  102  GLUCOSE  --  102*  BUN  --  8  CREATININE 0.76 0.77  CALCIUM  --  8.1*   CBG (last 3)  No results found for this basename: GLUCAP,  in the last 72 hours  Wt Readings from Last 3 Encounters:  12/03/12 69.582 kg (153 lb 6.4 oz)  11/25/12 69.3 kg (152 lb 12.5 oz)    Physical Exam:  Constitutional: He appears well-developed and well-nourished. No distress. Sitting at table eating breakfast  HENT: oral mucosa pink and moist  Head: Normocephalic and atraumatic.  Lungs clear to auscultation  Heart regular rate and rhythm no rubs murmurs or extra sounds  Abdomen positive bowel sounds soft nontender palpation  Trach site healed without leakage Neurological:  Alert, non-agitated.   Oriented to self, place with cueing. Persistent delay processing with simple and more complex tasks, but showing improvement.  Assessment/Plan: 1. 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 Physiatrist is providing close team supervision and 24 hour management of active medical problems listed below. Physiatrist and rehab team continue to assess barriers to discharge/monitor patient progress toward functional and  medical goals.  Pt has made remarkable progress. Finalize dc planning for tomorrow.   FIM: FIM - Bathing Bathing Steps Patient Completed: Chest;Right Arm;Left Arm;Abdomen;Front perineal area;Buttocks;Right upper leg;Left upper leg;Right lower leg (including foot);Left lower leg (including foot) Bathing: 5: Set-up assist to: Adjust water temp (stood for the whole shower)  FIM - Upper Body Dressing/Undressing Upper body dressing/undressing steps patient completed: Thread/unthread right sleeve of pullover shirt/dresss;Pull shirt over trunk;Put head through opening of pull over shirt/dress;Thread/unthread left sleeve of pullover shirt/dress Upper body dressing/undressing: 5: Supervision: Safety issues/verbal cues FIM - Lower Body Dressing/Undressing Lower body dressing/undressing steps patient completed: Thread/unthread right pants leg;Thread/unthread left pants leg;Pull pants up/down;Thread/unthread right underwear leg;Thread/unthread left underwear leg;Pull underwear up/down;Don/Doff right sock;Don/Doff left sock;Don/Doff left shoe;Don/Doff right shoe (elected to go barefoot) Lower body dressing/undressing: 5: Set-up assist to: Don/Doff TED stocking  FIM - Toileting Toileting steps completed by patient: Adjust clothing prior to toileting;Performs perineal hygiene;Adjust clothing after toileting Toileting: 5: Supervision: Safety issues/verbal cues  FIM - Archivist Transfers Assistive Devices: Grab bars Toilet Transfers: 5-From toilet/BSC: Supervision (verbal cues/safety issues);5-To toilet/BSC: Supervision (verbal cues/safety issues)  FIM - Banker Devices: Bed rails Bed/Chair Transfer: 6: Supine > Sit: No assist;6: Sit > Supine: No assist;5: Bed > Chair or W/C: Supervision (verbal cues/safety issues);5: Chair or W/C > Bed: Supervision (verbal cues/safety issues)  FIM - Locomotion: Wheelchair Distance: 75  Locomotion: Wheelchair: 2:  Travels 50 - 149 ft with supervision, cueing or coaxing FIM - Locomotion: Ambulation Ambulation/Gait Assistance: 5: Supervision Locomotion: Ambulation: 5: Travels 150 ft or more with supervision/safety issues  Comprehension Comprehension Mode: Auditory Comprehension: 4-Understands basic 75 - 89% of the time/requires cueing 10 - 24% of the time  Expression Expression Mode: Verbal Expression: 4-Expresses basic 75 - 89% of the time/requires cueing 10 - 24% of the time. Needs helper to occlude trach/needs to repeat words.  Social Interaction Social Interaction: 4-Interacts appropriately 75 - 89% of the time - Needs redirection for appropriate language or to initiate interaction.  Problem Solving Problem Solving: 4-Solves basic 75 - 89% of the time/requires cueing 10 - 24% of the time  Memory Memory: 3-Recognizes or recalls 50 - 74% of the time/requires cueing 25 - 49% of the time  Medical Problem List and Plan:  1. Severe deconditioning/anoxic encephalopathy after cardiac arrest/postop VDRF  2. DVT Prophylaxis/Anticoagulation: Subcutaneous Lovenox. Monitor platelet counts and any signs of bleeding  3. Pain Management: Tylenol as needed. Monitor with increased mobility  4. Mood: continue sleep chart  -increased seroquel to 150mg  qhs with earlier time, which seems to be effective- can taper as outpt---would keep him on this dose for now.--reviewed with family 5. Neuropsych: This patient is not capable of making decisions on his own behalf.  6. Dysphagia. Dysphagia 3 nectar thick liquids. Followup speech therapy and monitor for any signs of aspiration  7. seizure disorder. Dilantin 200 mg am and 300mg  pm, valproic acid 750 mg twice a day.  Monitor for any signs of seizure activity. Drug levels checked and adjustments made yesterday.  -needs levels rechecked in 2 weeks  -can have meds in pill form upon dc 8. Hypertension. Coreg 9.375 mg twice a day, Vasotec 2.5 mg twice a day, Aldactone  12.5 mg daily. Monitor with increased activity  9. Tobacco abuse. Counseling  10. Hyperlipidemia. Lipitor    LOS (Days) 9 A FACE TO FACE EVALUATION WAS PERFORMED  Cameo Schmiesing T 12/04/2012 9:20 AM

## 2012-12-04 NOTE — Progress Notes (Signed)
Social Work  Discharge Note  The overall goal for the admission was met for:   Discharge location: Yes - home with wife to provide 24/7 supervision  Length of Stay: Yes - 9 days  Discharge activity level: Yes - supervision  Home/community participation: Yes  Services provided included: MD, RD, PT, OT, SLP, RN, TR, Pharmacy, Neuropsych and SW  Financial Services: Private Insurance: BCBS  Follow-up services arranged: Outpatient: PT, OT, ST via [facility] and Patient/Family has no preference for HH/DME agencies  Comments (or additional information):  Patient/Family verbalized understanding of follow-up arrangements: Yes  Individual responsible for coordination of the follow-up plan: patient and wife  Confirmed correct DME delivered:  NA    Walter Frazier

## 2012-12-08 ENCOUNTER — Observation Stay (HOSPITAL_COMMUNITY)
Admission: EM | Admit: 2012-12-08 | Discharge: 2012-12-08 | DRG: 544 | Disposition: A | Discharge status: Home / Self Care | Payer: BC Managed Care – PPO | Attending: Internal Medicine | Admitting: Internal Medicine

## 2012-12-08 ENCOUNTER — Other Ambulatory Visit: Payer: Self-pay

## 2012-12-08 ENCOUNTER — Emergency Department (HOSPITAL_COMMUNITY): Payer: BC Managed Care – PPO

## 2012-12-08 ENCOUNTER — Encounter (HOSPITAL_COMMUNITY): Payer: Self-pay | Admitting: *Deleted

## 2012-12-08 DIAGNOSIS — I2510 Atherosclerotic heart disease of native coronary artery without angina pectoris: Secondary | ICD-10-CM | POA: Diagnosis present

## 2012-12-08 DIAGNOSIS — R569 Unspecified convulsions: Secondary | ICD-10-CM | POA: Diagnosis present

## 2012-12-08 DIAGNOSIS — J209 Acute bronchitis, unspecified: Secondary | ICD-10-CM

## 2012-12-08 DIAGNOSIS — I10 Essential (primary) hypertension: Secondary | ICD-10-CM | POA: Diagnosis present

## 2012-12-08 DIAGNOSIS — I252 Old myocardial infarction: Secondary | ICD-10-CM | POA: Insufficient documentation

## 2012-12-08 DIAGNOSIS — I5021 Acute systolic (congestive) heart failure: Principal | ICD-10-CM | POA: Insufficient documentation

## 2012-12-08 DIAGNOSIS — I2589 Other forms of chronic ischemic heart disease: Secondary | ICD-10-CM | POA: Insufficient documentation

## 2012-12-08 DIAGNOSIS — F17200 Nicotine dependence, unspecified, uncomplicated: Secondary | ICD-10-CM | POA: Insufficient documentation

## 2012-12-08 DIAGNOSIS — R0602 Shortness of breath: Secondary | ICD-10-CM | POA: Diagnosis present

## 2012-12-08 DIAGNOSIS — I509 Heart failure, unspecified: Secondary | ICD-10-CM | POA: Insufficient documentation

## 2012-12-08 DIAGNOSIS — Z8674 Personal history of sudden cardiac arrest: Secondary | ICD-10-CM | POA: Insufficient documentation

## 2012-12-08 DIAGNOSIS — I255 Ischemic cardiomyopathy: Secondary | ICD-10-CM | POA: Diagnosis present

## 2012-12-08 DIAGNOSIS — Z720 Tobacco use: Secondary | ICD-10-CM | POA: Insufficient documentation

## 2012-12-08 DIAGNOSIS — G9340 Encephalopathy, unspecified: Secondary | ICD-10-CM

## 2012-12-08 DIAGNOSIS — G40909 Epilepsy, unspecified, not intractable, without status epilepticus: Secondary | ICD-10-CM | POA: Insufficient documentation

## 2012-12-08 DIAGNOSIS — G931 Anoxic brain damage, not elsewhere classified: Secondary | ICD-10-CM | POA: Diagnosis present

## 2012-12-08 DIAGNOSIS — Z9861 Coronary angioplasty status: Secondary | ICD-10-CM | POA: Insufficient documentation

## 2012-12-08 DIAGNOSIS — Z79899 Other long term (current) drug therapy: Secondary | ICD-10-CM | POA: Insufficient documentation

## 2012-12-08 DIAGNOSIS — R0609 Other forms of dyspnea: Secondary | ICD-10-CM

## 2012-12-08 DIAGNOSIS — I469 Cardiac arrest, cause unspecified: Secondary | ICD-10-CM

## 2012-12-08 DIAGNOSIS — E785 Hyperlipidemia, unspecified: Secondary | ICD-10-CM | POA: Diagnosis present

## 2012-12-08 HISTORY — DX: Dysphagia, unspecified: R13.10

## 2012-12-08 HISTORY — DX: Epilepsy, unspecified, not intractable, without status epilepticus: G40.909

## 2012-12-08 HISTORY — DX: Hyperlipidemia, unspecified: E78.5

## 2012-12-08 HISTORY — DX: Anoxic brain damage, not elsewhere classified: G93.1

## 2012-12-08 HISTORY — DX: Tobacco use: Z72.0

## 2012-12-08 HISTORY — DX: Essential (primary) hypertension: I10

## 2012-12-08 HISTORY — DX: Ischemic cardiomyopathy: I25.5

## 2012-12-08 HISTORY — DX: Unspecified convulsions: R56.9

## 2012-12-08 HISTORY — DX: Atherosclerotic heart disease of native coronary artery without angina pectoris: I25.10

## 2012-12-08 LAB — D-DIMER, QUANTITATIVE: D-Dimer, Quant: <0.27 ug/mL-FEU (ref 0.00–0.48)

## 2012-12-08 LAB — TROPONIN I: Troponin I: <0.3 ng/mL (ref <0.30)

## 2012-12-08 LAB — CBC
Hemoglobin: 11.9 g/dL — ABNORMAL LOW (ref 13.0–17.0)
MCH: 31.6 pg (ref 26.0–34.0)
MCHC: 33.6 g/dL (ref 30.0–36.0)
Platelets: 221 10*3/uL (ref 150–400)
RDW: 14.8 % (ref 11.5–15.5)

## 2012-12-08 LAB — BASIC METABOLIC PANEL
BUN: 7 mg/dL (ref 6–23)
CO2: 27 mEq/L (ref 19–32)
Calcium: 8.5 mg/dL (ref 8.4–10.5)
Calcium: 8.2 mg/dL — ABNORMAL LOW (ref 8.4–10.5)
Chloride: 103 mEq/L (ref 96–112)
Creatinine, Ser: 0.67 mg/dL (ref 0.50–1.35)
Creatinine, Ser: 0.66 mg/dL (ref 0.50–1.35)
GFR calc Af Amer: >90 mL/min (ref >90)
GFR calc non Af Amer: >90 mL/min (ref >90)
Glucose, Bld: 112 mg/dL — ABNORMAL HIGH (ref 70–99)
Sodium: 135 mEq/L (ref 135–145)

## 2012-12-08 LAB — POCT I-STAT TROPONIN I

## 2012-12-08 LAB — PRO B NATRIURETIC PEPTIDE: Pro B Natriuretic peptide (BNP): 923.2 pg/mL — ABNORMAL HIGH (ref 0–125)

## 2012-12-08 LAB — PHENYTOIN LEVEL, TOTAL: Phenytoin Lvl: 9.8 ug/mL — ABNORMAL LOW (ref 10.0–20.0)

## 2012-12-08 MED ORDER — ENALAPRIL MALEATE 2.5 MG PO TABS
2.5000 mg | ORAL_TABLET | Freq: Every day | ORAL | Status: DC
  Administered 2012-12-08: 2.5 mg via ORAL
  Filled 2012-12-08: qty 1

## 2012-12-08 MED ORDER — ALBUTEROL SULFATE HFA 108 (90 BASE) MCG/ACT IN AERS: 2.0000 | INHALATION_SPRAY | Freq: Four times a day (QID) | RESPIRATORY_TRACT | Status: DC | PRN

## 2012-12-08 MED ORDER — ALBUTEROL SULFATE (5 MG/ML) 0.5% IN NEBU
5.0000 mg | INHALATION_SOLUTION | Freq: Once | RESPIRATORY_TRACT | Status: AC
  Administered 2012-12-08: 5 mg via RESPIRATORY_TRACT
  Filled 2012-12-08: qty 1

## 2012-12-08 MED ORDER — ASPIRIN 81 MG PO CHEW
81.0000 mg | CHEWABLE_TABLET | Freq: Every day | ORAL | Status: DC
  Administered 2012-12-08: 81 mg via ORAL
  Filled 2012-12-08: qty 1

## 2012-12-08 MED ORDER — SPIRONOLACTONE 12.5 MG HALF TABLET: 25.0000 mg | ORAL_TABLET | Freq: Every day | ORAL | Status: DC

## 2012-12-08 MED ORDER — FUROSEMIDE 10 MG/ML IJ SOLN
20.0000 mg | Freq: Once | INTRAMUSCULAR | Status: AC
  Administered 2012-12-08: 20 mg via INTRAVENOUS

## 2012-12-08 MED ORDER — ALBUTEROL SULFATE (5 MG/ML) 0.5% IN NEBU
2.5000 mg | INHALATION_SOLUTION | Freq: Four times a day (QID) | RESPIRATORY_TRACT | Status: DC
  Filled 2012-12-08: qty 0.5

## 2012-12-08 MED ORDER — HEPARIN (PORCINE) IN NACL 100-0.45 UNIT/ML-% IJ SOLN
850.0000 [IU]/h | INTRAMUSCULAR | Status: DC
  Administered 2012-12-08: 850 [IU]/h via INTRAVENOUS
  Filled 2012-12-08: qty 250

## 2012-12-08 MED ORDER — ONDANSETRON HCL 4 MG PO TABS: 4.0000 mg | ORAL_TABLET | Freq: Four times a day (QID) | ORAL | Status: DC | PRN

## 2012-12-08 MED ORDER — HEPARIN BOLUS VIA INFUSION
4000.0000 [IU] | Freq: Once | INTRAVENOUS | Status: AC
  Administered 2012-12-08: 4000 [IU] via INTRAVENOUS
  Filled 2012-12-08: qty 4000

## 2012-12-08 MED ORDER — CARVEDILOL 6.25 MG PO TABS
6.2500 mg | ORAL_TABLET | Freq: Two times a day (BID) | ORAL | Status: DC
  Filled 2012-12-08 (×2): qty 1

## 2012-12-08 MED ORDER — SODIUM CHLORIDE 0.9 % IJ SOLN
3.0000 mL | Freq: Two times a day (BID) | INTRAMUSCULAR | Status: DC
  Administered 2012-12-08: 3 mL via INTRAVENOUS

## 2012-12-08 MED ORDER — ALBUTEROL SULFATE (5 MG/ML) 0.5% IN NEBU
2.5000 mg | INHALATION_SOLUTION | RESPIRATORY_TRACT | Status: DC | PRN
  Administered 2012-12-08: 2.5 mg via RESPIRATORY_TRACT
  Filled 2012-12-08: qty 0.5

## 2012-12-08 MED ORDER — FUROSEMIDE 20 MG PO TABS: 20.0000 mg | ORAL_TABLET | Freq: Every day | ORAL | Status: DC | PRN

## 2012-12-08 MED ORDER — QUETIAPINE FUMARATE 50 MG PO TABS
150.0000 mg | ORAL_TABLET | Freq: Every day | ORAL | Status: DC
  Filled 2012-12-08: qty 1

## 2012-12-08 MED ORDER — SPIRONOLACTONE 12.5 MG HALF TABLET
12.5000 mg | ORAL_TABLET | Freq: Every day | ORAL | Status: DC
  Administered 2012-12-08: 12.5 mg via ORAL
  Filled 2012-12-08: qty 1

## 2012-12-08 MED ORDER — PHENYTOIN SODIUM EXTENDED 100 MG PO CAPS
300.0000 mg | ORAL_CAPSULE | Freq: Every day | ORAL | Status: DC
  Filled 2012-12-08: qty 3

## 2012-12-08 MED ORDER — ACETAMINOPHEN 325 MG PO TABS
650.0000 mg | ORAL_TABLET | Freq: Four times a day (QID) | ORAL | Status: DC | PRN
  Administered 2012-12-08: 650 mg via ORAL
  Filled 2012-12-08: qty 2

## 2012-12-08 MED ORDER — CLOPIDOGREL BISULFATE 75 MG PO TABS
75.0000 mg | ORAL_TABLET | Freq: Every day | ORAL | Status: DC
  Filled 2012-12-08: qty 1

## 2012-12-08 MED ORDER — ENOXAPARIN SODIUM 40 MG/0.4ML ~~LOC~~ SOLN
40.0000 mg | SUBCUTANEOUS | Status: DC
  Filled 2012-12-08: qty 0.4

## 2012-12-08 MED ORDER — ATORVASTATIN CALCIUM 80 MG PO TABS
80.0000 mg | ORAL_TABLET | Freq: Every day | ORAL | Status: DC
  Filled 2012-12-08: qty 1

## 2012-12-08 MED ORDER — PHENYTOIN SODIUM EXTENDED 100 MG PO CAPS
200.0000 mg | ORAL_CAPSULE | Freq: Every day | ORAL | Status: DC
  Administered 2012-12-08: 200 mg via ORAL
  Filled 2012-12-08: qty 2

## 2012-12-08 MED ORDER — FUROSEMIDE 10 MG/ML IJ SOLN
INTRAMUSCULAR | Status: AC
  Filled 2012-12-08: qty 4

## 2012-12-08 MED ORDER — ACETAMINOPHEN 650 MG RE SUPP: 650.0000 mg | Freq: Four times a day (QID) | RECTAL | Status: DC | PRN

## 2012-12-08 MED ORDER — VALPROIC ACID 250 MG PO CAPS
750.0000 mg | ORAL_CAPSULE | Freq: Two times a day (BID) | ORAL | Status: DC
  Administered 2012-12-08: 750 mg via ORAL
  Filled 2012-12-08 (×2): qty 3

## 2012-12-08 MED ORDER — GUAIFENESIN ER 600 MG PO TB12
600.0000 mg | ORAL_TABLET | Freq: Two times a day (BID) | ORAL | Status: DC | PRN
  Filled 2012-12-08: qty 1

## 2012-12-08 MED ORDER — FUROSEMIDE 20 MG PO TABS
20.0000 mg | ORAL_TABLET | Freq: Every day | ORAL | Status: DC | PRN
  Filled 2012-12-08: qty 1

## 2012-12-08 MED ORDER — ALBUTEROL SULFATE (5 MG/ML) 0.5% IN NEBU
2.5000 mg | INHALATION_SOLUTION | Freq: Once | RESPIRATORY_TRACT | Status: AC
  Administered 2012-12-08: 2.5 mg via RESPIRATORY_TRACT
  Filled 2012-12-08: qty 0.5

## 2012-12-08 MED ORDER — ONDANSETRON HCL 4 MG/2ML IJ SOLN: 4.0000 mg | Freq: Four times a day (QID) | INTRAMUSCULAR | Status: DC | PRN

## 2012-12-08 NOTE — Progress Notes (Signed)
Pt seen and examined, admitted this am per Dr.Kakrakandy 56/M s/p recent VT arrest, Ant STEMI s/p DES to LAD 5/14, prolonged hosp course and rehab stay,  just discharged home 7/3 presented with cough, and dyspnea Suspect Tracheobronchitis clinically -continue nebs, add mucinex, aspiration precautions -BNP mildly up and Troponin slightly up to 0.35, denies any chest pain -continue to cycle enzymes -Seba Dalkai cards following -continue ASA/Plavix/coreg/statin  Walter Cove, MD [phone]

## 2012-12-08 NOTE — Progress Notes (Signed)
Troponin now 0.35. [facility] notified; new orders received.Walter Frazier

## 2012-12-08 NOTE — ED Provider Notes (Signed)
History    CSN: [id number] Arrival date & time 12/08/12  0142  First MD Initiated Contact with Patient 12/08/12 0159     Chief Complaint  Patient presents with  . Shortness of Breath   (Consider location/radiation/quality/duration/timing/severity/associated sxs/prior Treatment) HPI Comments: Patient presents to ER for evaluation of shortness of breath. Patient reports that symptoms began yesterday. He has been experiencing intermittent cough by difficulty catching his breath. Cough is only intermittently is productive of thin sputum. He has not had a fever. Patient denies chest pain associated with the symptoms.  Patient is a 57 y.o. male presenting with shortness of breath.  Shortness of Breath Associated symptoms: cough   Associated symptoms: no chest pain    Past Medical History  Diagnosis Date  . Coronary artery disease   . Myocardial infarct    Past Surgical History  Procedure Laterality Date  . Back surgery      ~20 years ago by Dr. Jeral Fruit   Family History  Problem Relation Age of Onset  . Heart disease Father    History  Substance Use Topics  . Smoking status: Current Every Day Smoker -- 1.00 packs/day    Types: Cigarettes  . Smokeless tobacco: Not on file  . Alcohol Use: 6.0 oz/week    12 drink(s) per week    Review of Systems  Respiratory: Positive for cough and shortness of breath.   Cardiovascular: Negative for chest pain and leg swelling.  All other systems reviewed and are negative.    Allergies  Review of patient's allergies indicates no known allergies.  Home Medications   Current Outpatient Rx  Name  Route  Sig  Dispense  Refill  . aspirin 81 MG chewable tablet   Oral   Chew 1 tablet (81 mg total) by mouth daily.         Marland Kitchen atorvastatin (LIPITOR) 80 MG tablet   Oral   Take 1 tablet (80 mg total) by mouth daily at 6 PM.   30 tablet   1   . carvedilol (COREG) 6.25 MG tablet   Oral   Take 1 tablet (6.25 mg total) by mouth 2 (two)  times daily with a meal.   60 tablet   1   . clopidogrel (PLAVIX) 75 MG tablet   Oral   Take 1 tablet (75 mg total) by mouth daily with breakfast.   30 tablet   1   . enalapril (VASOTEC) 2.5 MG tablet   Oral   Take 1 tablet (2.5 mg total) by mouth daily.   30 tablet   1   . phenytoin (DILANTIN) 200 MG ER capsule   Oral   Take 1 capsule (200 mg total) by mouth daily.   30 capsule   1   . phenytoin (DILANTIN) 300 MG ER capsule   Oral   Take 1 capsule (300 mg total) by mouth at bedtime.   30 capsule   1   . QUEtiapine (SEROQUEL) 50 MG tablet   Oral   Take 3 tablets (150 mg total) by mouth at bedtime.   30 tablet   1   . spironolactone (ALDACTONE) 12.5 mg TABS   Oral   Take 0.5 tablets (12.5 mg total) by mouth daily.   30 tablet   1   . valproic acid (DEPAKENE) 250 MG capsule   Oral   Take 3 capsules (750 mg total) by mouth 2 (two) times daily.   90 capsule   1  BP 106/69  Pulse 80  Temp(Src) 97.8 F (36.6 C) (Oral)  Resp 17  SpO2 99% Physical Exam  Constitutional: He is oriented to person, place, and time. He appears well-developed and well-nourished. No distress.  HENT:  Head: Normocephalic and atraumatic.  Right Ear: Hearing normal.  Left Ear: Hearing normal.  Nose: Nose normal.  Mouth/Throat: Oropharynx is clear and moist and mucous membranes are normal.  Eyes: Conjunctivae and EOM are normal. Pupils are equal, round, and reactive to light.  Neck: Normal range of motion. Neck supple.  Cardiovascular: Regular rhythm, S1 normal and S2 normal.  Exam reveals no gallop and no friction rub.   No murmur heard. Pulmonary/Chest: Effort normal. No respiratory distress. He has decreased breath sounds. He has no wheezes. He has rhonchi in the right lower field. He has rales in the right lower field and the left lower field. He exhibits no tenderness.  Abdominal: Soft. Normal appearance and bowel sounds are normal. There is no hepatosplenomegaly. There is no  tenderness. There is no rebound, no guarding, no tenderness at McBurney's point and negative Murphy's sign. No hernia.  Musculoskeletal: Normal range of motion.  Neurological: He is alert and oriented to person, place, and time. He has normal strength. No cranial nerve deficit or sensory deficit. Coordination normal. GCS eye subscore is 4. GCS verbal subscore is 5. GCS motor subscore is 6.  Skin: Skin is warm, dry and intact. No rash noted. No cyanosis.  Psychiatric: He has a normal mood and affect. His speech is normal and behavior is normal. Thought content normal.    ED Course  Procedures (including critical care time)  EKG:  Date: 12/08/2012  Rate: 83  Rhythm: normal sinus rhythm  QRS Axis: normal  Intervals: normal  ST/T Wave abnormalities: ST elevations anteriorly, ST elevations laterally and T wave inversions ant-lat  Conduction Disutrbances:none  Narrative Interpretation:   Old EKG Reviewed: changes present on prior EKG    Labs Reviewed  BASIC METABOLIC PANEL - Abnormal; Notable for the following:    Glucose, Bld 108 (*)    All other components within normal limits  PRO B NATRIURETIC PEPTIDE - Abnormal; Notable for the following:    Pro B Natriuretic peptide (BNP) 923.2 (*)    All other components within normal limits  POCT I-STAT TROPONIN I - Abnormal; Notable for the following:    Troponin i, poc 0.09 (*)    All other components within normal limits  TROPONIN I   Dg Chest Port 1 View  12/08/2012   *RADIOLOGY REPORT*  Clinical Data: Shortness of breath.  PORTABLE CHEST - 1 VIEW  Comparison: 11/11/2012  Findings: Interval removal of tracheostomy and central venous catheters.  Enteric tube has also been removed.  Shallow inspiration with some elevation of the left hemidiaphragm. The heart size and pulmonary vascularity are normal. The lungs appear clear and expanded without focal air space disease or consolidation. No blunting of the costophrenic angles.  No pneumothorax.   Mediastinal contours appear intact.  IMPRESSION: Interval removal appliances.  No evidence of active pulmonary disease.   Original Report Authenticated By: Burman Nieves, M.D.   Diagnosis: Shortness of breath secondary to bronchospasm  MDM  Patient with extensive recent past medical history including ST elevation MI complicated by cardiac arrest. Patient had an extended hospital stay including tracheostomy placement. Patient was discharged from the hospital 3 days ago, since discharge has had progressively worsening shortness of breath associated with cough. X-ray did not show any evidence  of congestive heart failure or pneumonia. Patient's breath sounds were diminished but no significant wheezing arrival. He did have basilar crackles present. Patient was administered albuterol and repeat examination now reveals a wheezing consistent with bronchospasm. Based on the patient's significant recent history, will require repeat hospitalization for further management. He is not hypoxic at this time, is stable.  Initial workup included an i-STAT troponin which was elevated. This was repeated as a proper troponin and it was negative. This is felt to be a spurious elevation.     Gilda Crease, MD 12/08/12 [phone]

## 2012-12-08 NOTE — Progress Notes (Signed)
ANTICOAGULATION CONSULT NOTE - Initial Consult  Pharmacy Consult for Heparin Indication: chest pain/ACS  No Known Allergies  Patient Measurements: Height: 5\' 11"  (180.3 cm) Weight: 155 lb 3.3 oz (70.4 kg) IBW/kg (Calculated) : 75.3 Heparin Dosing Weight: 70.4 kg  Vital Signs: Temp: 98 F (36.7 C) (07/07 0732) Temp src: Oral (07/07 0732) BP: 105/67 mmHg (07/07 0732) Pulse Rate: 81 (07/07 0732)  Labs:  Recent Labs  12/08/12 0201 12/08/12 0755 12/08/12 0800  HGB  --  11.9*  --   HCT  --  35.4*  --   PLT  --  221  --   CREATININE 0.67 0.66  --   TROPONINI <0.30  --  0.35*    Estimated Creatinine Clearance: 103.9 ml/min (by C-G formula based on Cr of 0.66).   Medical History: Past Medical History  Diagnosis Date  . Coronary artery disease     a. VT Arrest/Ant STEMI- Cath/PCI: DES to LAD (no other details of cath/pci in Epic).  . Ischemic cardiomyopathy     a. 11/2012 Echo: Ef 25-30%, septal apical & ant HK. No apical thrombus.  . Anoxic encephalopathy     a. s/p VT arrest 10/2012.  . Seizure disorder     a. in setting of encephalopathy post VT arrest 11/2012.  Marland Kitchen Hypertension   . Hyperlipidemia   . Dysphagia     a. 11/2012 s/p arrest with prolonged intubation and Trach.  . Tobacco abuse    Assessment:  Admitted overnight with shortness of breath.  VT arrest/anterior MI in May 2014, had PCI/DES of LAD.  On Aspirin and Plavix.  Now with positive troponin and to begin IV heparin.  Lovenox 40 mg SQ q24hrs had been ordered initially to begin today, none given, now dc'd.  Goal of Therapy:  Heparin level 0.3-0.7 units/ml Monitor platelets by anticoagulation protocol: Yes   Plan:   Heparin 4000 units IV bolus, then heparin drip at 850 units/hr.  Heparin level ~6hrs after drip begins.  Daily heparin level and CBC while on heparin.  Dennie Fetters, RPh Pager: [phone] 12/08/2012,10:27 AM

## 2012-12-08 NOTE — Consult Note (Signed)
CARDIOLOGY CONSULT NOTE  Patient ID: Walter Frazier MRN: [id number], DOB/AGE: 1955-08-13   Admit date: 12/08/2012 Date of Consult: 12/08/2012  Primary Physician: No primary provider on file. Primary Cardiologist: Lovina Reach, MD   Pt. Profile  57 y/o male with recent h/o VT arrest/Ant MI in 10/2012 requiring PCI/DES of the LAD and prolonged hospitalization requiring rehab stay related to anoxic encephalopathy and deconditioning who presented back to the ED this AM 2/2 dyspnea (d/c'd 7/3).  Problem List  Past Medical History  Diagnosis Date  . Coronary artery disease     a. VT Arrest/Ant STEMI- Cath/PCI: DES to LAD (no other details of cath/pci in Epic).  . Ischemic cardiomyopathy     a. 11/2012 Echo: Ef 25-30%, septal apical & ant HK. No apical thrombus.  . Anoxic encephalopathy     a. s/p VT arrest 10/2012.  . Seizure disorder     a. in setting of encephalopathy post VT arrest 11/2012.  Marland Kitchen Hypertension   . Hyperlipidemia   . Dysphagia     a. 11/2012 s/p arrest with prolonged intubation and Trach.  . Tobacco abuse     Past Surgical History  Procedure Laterality Date  . Back surgery      ~20 years ago by Dr. Jeral Fruit    Allergies  No Known Allergies  HPI   57 y/o male with a h/o VT arrest and Ant MI in May of this year.  He underwent emergent cath and subsequent PCI/DES of the LAD.  He was cooled and initially required vasporessor therapy along with IABP.  He required prolonged intubation related to delayed neurologic recovery and a trach was placed.  He was also noted to have seizure activity and was placed on appropriate meds.  He was eventually extubated and was noted to suffer from anoxic brain injury.  EF was found to be 25-30% and he was managed with slow titration of bb/acei/spiro.  He was d/c'd to rehab on 6/24, where he stayed until d/c from there on 7/3.  Following d/c, he felt well.  He went for walks over the weekend.  He has not been watching his sodium intake or weighing  himself.  Since d/c, he's had lasagna, deli meats/sandwich and chips, and last night, 3 slices of [employer] pizza.  Beginning yesterday, he noted dyspnea on exertion along with cough and orthopnea.  Cough has been nonproductive and worse with lying flat.  Though his appetite was poor at lunch time yesterday (associated with nausea), he did eat 3 slices of pizza last night as noted above.  He denies any difficulty swallowing or choking.  Further he denies pnd, chest pain, dizziness, syncope, or edema.  Due to persistent/orgressive dyspnea and cough he presented to the ED early this AM.  Initial troponin is <.30 while pBNP is mildly elevated @ 932 (no prior for comparison).  CXR shows no acute findings.  He was admitted by internal medicine and we've been asked to eval.  Home Medications Prior to Admission medications   Medication Sig Start Date End Date Taking? Authorizing Provider  aspirin 81 MG chewable tablet Chew 1 tablet (81 mg total) by mouth daily. 12/04/12  Yes Nicko Daher J Angiulli, PA-C  atorvastatin (LIPITOR) 80 MG tablet Take 1 tablet (80 mg total) by mouth daily at 6 PM. 12/04/12  Yes Mcarthur Rossetti Angiulli, PA-C  carvedilol (COREG) 6.25 MG tablet Take 1 tablet (6.25 mg total) by mouth 2 (two) times daily with a meal. 12/04/12  Yes  Mcarthur Rossetti Angiulli, PA-C  clopidogrel (PLAVIX) 75 MG tablet Take 1 tablet (75 mg total) by mouth daily with breakfast. 12/04/12  Yes Mcarthur Rossetti Angiulli, PA-C  enalapril (VASOTEC) 2.5 MG tablet Take 1 tablet (2.5 mg total) by mouth daily. 12/04/12  Yes Raima Geathers J Angiulli, PA-C  phenytoin (DILANTIN) 200 MG ER capsule Take 1 capsule (200 mg total) by mouth daily. 12/04/12  Yes Madilyn Cephas J Angiulli, PA-C  phenytoin (DILANTIN) 300 MG ER capsule Take 1 capsule (300 mg total) by mouth at bedtime. 12/04/12  Yes Valery Amedee J Angiulli, PA-C  QUEtiapine (SEROQUEL) 50 MG tablet Take 3 tablets (150 mg total) by mouth at bedtime. 12/04/12  Yes Chloe Flis J Angiulli, PA-C  spironolactone (ALDACTONE) 12.5 mg TABS  Take 0.5 tablets (12.5 mg total) by mouth daily. 12/04/12  Yes Tyjuan Demetro J Angiulli, PA-C  valproic acid (DEPAKENE) 250 MG capsule Take 3 capsules (750 mg total) by mouth 2 (two) times daily. 12/04/12  Yes Mcarthur Rossetti Angiulli, PA-C   Family History Family History  Problem Relation Age of Onset  . Heart disease Father     Social History History   Social History  . Marital Status: Married    Spouse Name: N/A    Number of Children: N/A  . Years of Education: N/A   Occupational History  . Not on file.   Social History Main Topics  . Smoking status: Former Smoker -- 1.00 packs/day for 30 years    Types: Cigarettes  . Smokeless tobacco: Not on file  . Alcohol Use: 6.0 oz/week    12 drink(s) per week     Comment: used to drink ~ 6 pack beer/weekend  . Drug Use: No  . Sexually Active: Not on file   Other Topics Concern  . Not on file   Social History Narrative   Lives in [place] with his wife.  Previously a Writer in Geologist, engineering.  Most recently worked in Environmental manager.     Review of Systems  General:  No chills, fever, night sweats or weight changes.  Cardiovascular:  No chest pain, +++ dyspnea on exertion, edema, orthopnea, palpitations, paroxysmal nocturnal dyspnea. Dermatological: No rash, lesions/masses Respiratory: +++ cough, dyspnea Urologic: No hematuria, dysuria Abdominal:   +++ nausea yesterday - has since resolved.  No vomiting, diarrhea, bright red blood per rectum, melena, or hematemesis Neurologic:  No visual changes, wkns, changes in mental status. All other systems reviewed and are otherwise negative except as noted above.  Physical Exam  Blood pressure 105/67, pulse 81, temperature 98 F (36.7 C), temperature source Oral, resp. rate 18, weight 155 lb 3.3 oz (70.4 kg), SpO2 99.00%.  General: Pleasant, NAD Psych: Normal affect. Neuro: Alert and oriented X 3. Moves all extremities spontaneously. HEENT: Normal  Neck:  Supple without bruits.  Neck veins flat.  Upper airway wheezing noted. Lungs:  Resp regular and unlabored, bibasilar rhonchi, otw cta w/ upper airway wheeze as noted above. Heart: RRR no s3, s4, or murmurs. Abdomen: Soft, non-tender, non-distended, BS + x 4.  Extremities: No clubbing, cyanosis or edema. DP/PT/Radials 2+ and equal bilaterally.  Labs   Recent Labs  12/08/12 0201  TROPONINI <0.30   Lab Results  Component Value Date   WBC 10.4 12/03/2012   HGB 11.8* 12/03/2012   HCT 35.4* 12/03/2012   MCV 92.9 12/03/2012   PLT 269 12/03/2012     Recent Labs Lab 12/03/12 0610 12/08/12 0201  NA 137 135  K 4.1 4.3  CL 102 103  CO2 26 27  BUN 8 8  CREATININE 0.77 0.67  CALCIUM 8.1* 8.5  PROT 6.0  --   BILITOT 0.2*  --   ALKPHOS 114  --   ALT 20  --   AST 19  --   GLUCOSE 102* 108*   Lab Results  Component Value Date   TRIG 129 11/08/2012   Radiology/Studies  Dg Chest Port 1 View  12/08/2012   *RADIOLOGY REPORT*  Clinical Data: Shortness of breath.  PORTABLE CHEST - 1 VIEW  Comparison: 11/11/2012  Findings: Interval removal of tracheostomy and central venous catheters.  Enteric tube has also been removed.  Shallow inspiration with some elevation of the left hemidiaphragm. The heart size and pulmonary vascularity are normal. The lungs appear clear and expanded without focal air space disease or consolidation. No blunting of the costophrenic angles.  No pneumothorax.  Mediastinal contours appear intact.  IMPRESSION: Interval removal appliances.  No evidence of active pulmonary disease.   Original Report Authenticated By: Burman Nieves, M.D.   ECG  RSR, 83, ant infarct with antlat twi - no acute changes.  ASSESSMENT AND PLAN  1.  Dyspnea:  Developed fairly abruptly yesterday and progressed throughout the day and evening associated with non-productive cough.  No fevers/chills.  WBC nl.  Says he been eating and swallowing (regular diet) w/o difficulty and has not had any choking  spells.  CXR shows no acute dzs.  Neck veins appear flat.  No significant abdominal distension.  His weight, if accurate, is up 2 lbs since 7/2 in the setting of a fairly high NaCl intake over the weekend.  Pro-BNP is also mildly elevated.  He is not on lasix @ home, though is on spironolactone.  Will give 1 dose of Lasix 20mg  IV and agree with continuing other CHF meds.  If no significant improvement noted, will need to consider other potential causes.  He does have a mild upper respiratory wheeze and is s/p trach, which externally appears to be healing well - ? Tracheobronchitis vs some degree of tracheal stenosis.  2.  ICM:  EF 25-30%.  See above.  Cont bb/acei/spiro.  Education provided re: importance of daily weights, Na restriction, and symptom reporting.  LifeVest has been discussed in the past and it was felt that given encephalopathy and impulsiveness that he was not an ideal candidate and may be prone to inappropriate shocks.  Will need outpt f/u and echo in 2 mos (index revasc 5/29).  He should likely f/u in our [place] office or CHF clinic.  3.  CAD:  S/p VT arrest.  No chest pain or ecg changes.  Trop < 0.30.  Cont to follow.  Cont asa, plavix, bb, statin.  4.  Anoxic encephalopathy:  Stable.  5.  Sz Disorder:  Cont home meds.   6.  Tob Abuse:  Quit.  Cont cessation advised.  7. hypokalemia  Signed, Nicolasa Ducking, NP 12/08/2012, 7:50 AM   Patient seen and examined independently. Gilford Raid, NP note reviewed carefully - agree with his assessment and plan. I have edited the note based on my findings.    Dyspnea and orthopnea has resolved with one dose lasix. Suspect he had early HF in setting of dietary non-compliance. Neck veins now flat and CXR clear. Would increase spiro to 25 mg daily (check BMET at end of week) and give lasix 20 mg to use prn for weight gain greater than 2 pounds. (156 or greater). Reinforced need for  daily weights and reviewed use of sliding scale  diuretics. Can go home today. F/u in [place] office or with [place] in CHF Clinic. In 1-2 weeks.   Jrue Jarriel,MD 11:37 AM

## 2012-12-08 NOTE — Discharge Summary (Signed)
Physician Discharge Summary  Walter Frazier ZOX:[id number] DOB: [birth year] DOA: 12/08/2012  PCP: No primary provider on file.  Admit date: 12/08/2012 Discharge date: 12/08/2012  Time spent: 40 minutes  Recommendations for Outpatient Follow-up:  1. [facility] in [place] or Heart failure clinic in 1 week 2. [facility] in 1 week to FU on potassium  Discharge Diagnoses:    Mild Acute systolic CHF   Seizure   HTN (hypertension)   Hyperlipidemia   Anoxic encephalopathy   Ischemic cardiomyopathy   Coronary artery disease   Discharge Condition: stable  Diet recommendation: low sodium, heart healthy  Filed Weights   12/08/12 0732  Weight: 70.4 kg (155 lb 3.3 oz)    History of present illness:  56/M s/p recent VT arrest, Ant STEMI s/p DES to LAD 5/14, prolonged hosp course and rehab stay, just discharged home 7/3 presented with cough, and dyspnea x 1-2days  Hospital Course:  Clinically suspected mild CHF exacerbation due to dietary indiscretion vs Tracheobronchitis  -clinically stable and improved following neb treatment and 1 dose of lasix. -[facility] Dr.Bensimhon saw him in consultation, who suspected mild CHf and felt that mild troponin elevation was also secondary to this. -he was discharged home per cards recommendations on higher dose of aldactone and lasix PRN -needs [facility] in 1 week -to FU at [facility] in [place] vs heart failure clinic -continue ASA/Plavix/coreg/statin. -Pt was asymptomatic with normal exam, flat JVD and stable vital signs at discharge   Consultations:  [facility]   Discharge Exam: Filed Vitals:   12/08/12 0400 12/08/12 0500 12/08/12 0600 12/08/12 0732  BP: 96/66 104/81 110/62 105/67  Pulse: 82 80 91 81  Temp:    98 F (36.7 C)  TempSrc:    Oral  Resp: 19 10 22 18   Height:    5\' 11"  (1.803 m)  Weight:    70.4 kg (155 lb 3.3 oz)  SpO2: 97% 100% 97% 99%    General: AAOx3 Cardiovascular: S1S2/RRR Respiratory:  CTAB  Discharge Instructions  Discharge Orders   Future Appointments Provider Department Dept Phone   01/14/2013 11:00 AM Ranelle Oyster, MD [facility] [phone]   Future Orders Complete By Expires     Diet - low sodium heart healthy  As directed     Increase activity slowly  As directed         Medication List         albuterol 108 (90 BASE) MCG/ACT inhaler  Commonly known as:  PROVENTIL HFA;VENTOLIN HFA  Inhale 2 puffs into the lungs every 6 (six) hours as needed for wheezing.     aspirin 81 MG chewable tablet  Chew 1 tablet (81 mg total) by mouth daily.     atorvastatin 80 MG tablet  Commonly known as:  LIPITOR  Take 1 tablet (80 mg total) by mouth daily at 6 PM.     carvedilol 6.25 MG tablet  Commonly known as:  COREG  Take 1 tablet (6.25 mg total) by mouth 2 (two) times daily with a meal.     clopidogrel 75 MG tablet  Commonly known as:  PLAVIX  Take 1 tablet (75 mg total) by mouth daily with breakfast.     enalapril 2.5 MG tablet  Commonly known as:  VASOTEC  Take 1 tablet (2.5 mg total) by mouth daily.     furosemide 20 MG tablet  Commonly known as:  LASIX  Take 1 tablet (20 mg total) by mouth daily as  needed (for weight gain >2 lbs, swelling etc).     phenytoin 200 MG ER capsule  Commonly known as:  DILANTIN  Take 1 capsule (200 mg total) by mouth daily.     phenytoin 300 MG ER capsule  Commonly known as:  DILANTIN  Take 1 capsule (300 mg total) by mouth at bedtime.     QUEtiapine 50 MG tablet  Commonly known as:  SEROQUEL  Take 3 tablets (150 mg total) by mouth at bedtime.     spironolactone 12.5 mg Tabs  Commonly known as:  ALDACTONE  Take 1 tablet (25 mg total) by mouth daily.     valproic acid 250 MG capsule  Commonly known as:  DEPAKENE  Take 3 capsules (750 mg total) by mouth 2 (two) times daily.       No Known Allergies     Follow-up Information   Follow up with [facility] at  [place]. Schedule an appointment as soon as possible for a visit in 10 days.   Contact information:   [address], [address] [place] [address] [phone]       The results of significant diagnostics from this hospitalization (including imaging, microbiology, ancillary and laboratory) are listed below for reference.    Significant Diagnostic Studies: Dg Abd 1 View  11/17/2012   *RADIOLOGY REPORT*  Clinical Data: Oral contrast assessment prior to possible gastrostomy tube placement.  ABDOMEN - 1 VIEW  Comparison: None.  Findings: A nasogastric tube is present in the distal stomach. Underlying bowel gas pattern shows no evidence of obstruction or significant ileus.  There is no contrast visualized in the stomach or bowel.  No abnormal calcifications are seen.  Degenerative changes are present in the lower lumbar spine.  IMPRESSION: No oral contrast is identified in the stomach or bowel.  There is no evidence of obstructive pattern or significant ileus.   Original Report Authenticated By: Irish Lack, M.D.   Dg Chest Port 1 View  12/08/2012   *RADIOLOGY REPORT*  Clinical Data: Shortness of breath.  PORTABLE CHEST - 1 VIEW  Comparison: 11/11/2012  Findings: Interval removal of tracheostomy and central venous catheters.  Enteric tube has also been removed.  Shallow inspiration with some elevation of the left hemidiaphragm. The heart size and pulmonary vascularity are normal. The lungs appear clear and expanded without focal air space disease or consolidation. No blunting of the costophrenic angles.  No pneumothorax.  Mediastinal contours appear intact.  IMPRESSION: Interval removal appliances.  No evidence of active pulmonary disease.   Original Report Authenticated By: Burman Nieves, M.D.   Chest Portable 1 View To Assess Tube Placement And Rule-out Pneumothorax  11/11/2012   *RADIOLOGY REPORT*  Clinical Data: Tracheostomy tube placement.  Acute respiratory failure.  Myocardial  infarction.  PORTABLE CHEST - 1 VIEW  Comparison: 11/10/2012  Findings: A new tracheostomy tube is seen in appropriate position in the mid thoracic trachea.  A left internal jugular center venous catheter remains in appropriate position with tip in the mid SVC. A nasogastric tube been pulled back, with the distal tip now in the proximal thoracic esophagus. No pneumothorax identified.  There is mild atelectasis in the medial left lung base which shows no significant change.  Right lung is clear.  Heart size is within normal limits.  IMPRESSION:  New tracheostomy tube in appropriate position.  No evidence of pneumomediastinum or pneumothorax. 2.  No significant change and left basilar atelectasis. 3.  Abnormal positioning of nasogastric tube, with  tip in the proximal thoracic esophagus.   Original Report Authenticated By: Myles Rosenthal, M.D.   Dg Chest Port 1 View  11/10/2012   *RADIOLOGY REPORT*  Clinical Data: Myocardial infarction - endotracheal tube placement.  PORTABLE CHEST - 1 VIEW  Comparison: 11/10/2012 and prior chest radiographs dating back to 10/30/2012  Findings: An endotracheal tube appears in satisfactory position with tip 3.6 cm above the carina. A left IJ central venous catheters present with tip overlying the mid - upper SVC / azygos vein. An NG tube is present with tip overlying the EG junction. The cardiomediastinal silhouette is stable. Continued left lower lung atelectasis/consolidation is noted. There is no evidence of pneumothorax or pulmonary edema.  IMPRESSION: Support apparatus as described.  Recommend NG tube advancement.  Continued left lower lobe atelectasis/consolidation.   Original Report Authenticated By: Harmon Pier, M.D.   Dg Chest Port 1 View  11/10/2012   *RADIOLOGY REPORT*  Clinical Data: Evaluate endotracheal tube  PORTABLE CHEST - 1 VIEW  Comparison: 11/09/2012; 11/08/2012; 11/07/2012  Findings:  Grossly unchanged cardiac silhouette and mediastinal contours. Stable positioning  of support apparatus.  No pneumothorax. Heterogeneous air space opacities within the mid and inferior aspect of the left lung are similar to remote prior examinations. No new focal airspace opacities.  No definite pleural effusion. Unchanged bones.  IMPRESSION: 1.  Stable positioning of support apparatus.  No pneumothorax. 2.  Left mid and lower lung heterogeneous air space opacities are similar to the 11/07/2012 examination and again remain concerning for infection.   Original Report Authenticated By: Tacey Ruiz, MD   Dg Chest Port 1 View  11/09/2012   *RADIOLOGY REPORT*  Clinical Data: Endotracheal tube, assess effusions and left lower lobe airspace opacity  PORTABLE CHEST - 1 VIEW  Comparison: Prior chest x-ray six in 2014  Findings: To the endotracheal tube is 4.4 cm above the carina.  The left IJ approach central venous catheter is in stable position with the tip in the mid SVC.  Nasogastric tube is coiled within the stomach.  Improving left basilar opacity with some persistent opacity in the left retrocardiac region.  Cardiac and mediastinal contours are unchanged. No definite pleural effusion on today's exam.  The right lung is relatively clear.  No acute osseous abnormality.  IMPRESSION:  1.  Improving left basilar opacity with some residual retrocardiac density which may reflect atelectasis or infiltrate. 2.  No definite effusion. 3.  Stable and satisfactory support apparatus.   Original Report Authenticated By: Malachy Moan, M.D.   Dg Abd Portable 1v  11/18/2012   *RADIOLOGY REPORT*  Clinical Data: Evaluate barium prior to G tube placement  PORTABLE ABDOMEN - 1 VIEW  Comparison: Prior KUB 11/17/2012  Findings: Barium contrast is noted throughout the small bowel. There is a moderate amount of retained barium in the gastric fundus and body of the stomach.  A nasogastric tube is in place.  The tip projects over the gastric antrum.  There may be a small amount of contrast within the cecum.  No acute  osseous abnormality.  No evidence of bowel obstruction.  IMPRESSION:  1.  Barium contrast noted throughout the small bowel with some residual contrast in the stomach. 2.  Recommend repeat KUB in 24 hours.   Original Report Authenticated By: Malachy Moan, M.D.   Dg Swallowing Func-speech Pathology  12/03/2012   Carolan Shiver, CCC-SLP     12/03/2012  9:53 AM Objective Swallowing Evaluation: Modified Barium Swallowing Study   Patient Details  Name: Walter Frazier MRN: [id number] Date of Birth: November 30, 1955  Today's Date: 12/03/2012 Time: 0900-0930 Time Calculation (min): 30 min  Past Medical History: No past medical history on file. Past Surgical History:  Past Surgical History  Procedure Laterality Date  . Back surgery      ~20 years ago by Dr. Jeral Fruit   HPI:  57 y/o male smoker with no known past medical history was  admitted to [facility] on 5/29 after cardiac arrest from a STEMI. 5/29  started amiodarone and underwent LHC > stent to prox LAD and IABP  placed.  He was intubated and comatose afterwards and was started  on the hypothermia protocol. He transferred to [facility] for further  care on same day.  After re-warming, noted to be myoclonic  requiring further sedation. 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 He improved with ativan,  Dilantin and then Keppra. Final Seizure regimen will be Valproate  and Dilantin. Extubation was attempted but he failed due to  agitation. He was reintubated and then underwent trach placement  6/10. He was treated for E coli UTI with ceftriaxone 6/11 - 6/14.  Post trach placement his MS improved although he still showed  some disorientation and confusion. Transferred to SDU on 6/13,  trach downsized on 6/18; MBS 6/18 - dysphagia 3, nectar-thick  liquids; decannulated 6/20. Admitted to CIR to address severe  deconditioning/anoxic encephalopathy.  Pt has been tolerating a  dysphagia 3 diet with nectars - plan is for D/C home tomorrow.        Recommendation/Prognosis  Clinical Impression Dysphagia Diagnosis: Within Functional Limits  Clinical impression:  Dysphagia has resolved: pt presents with  normal mastication; timely swallow trigger; strong pharyngeal  peristalsis; excellent mobility of hyolaryngeal complex; no  penetration nor aspiration of any tested consistences.  Recommend  advancing diet to regular with thin liquids; meds whole with thin  liquids.  (Note: Pt cleared throat throughout procedure - this  was not associated with laryngeal penetration/aspiration.)    Swallow Evaluation Recommendations Diet Recommendations: Regular;Thin liquid Liquid Administration via: Cup;Straw Medication Administration: Whole meds with liquid Supervision: Patient able to self feed Postural Changes and/or Swallow Maneuvers: Seated upright 90  degrees Oral Care Recommendations: Oral care BID   Individuals Consulted Consulted and Agree with Results and Recommendations:  Patient;Patient unable/family or caregiver not available Family Member Consulted:  (wife not present for education)     General:  Type of Study: Modified Barium Swallowing Study Reason for Referral: Objectively evaluate swallowing function Previous Swallow Assessment: MBS on 11/19/12 and recommended Dys.  3 textures with nectar thick liquids Diet Prior to this Study: Dysphagia 3 (soft);Nectar-thick liquids Temperature Spikes Noted: No Respiratory Status: Room air History of Recent Intubation: Yes Length of Intubations (days): 12 days Date extubated: 11/10/12 Behavior/Cognition: Alert;Cooperative Oral Cavity - Dentition: Adequate natural dentition Oral Motor / Sensory Function: Within functional limits Self-Feeding Abilities: Able to feed self Patient Positioning: Upright in chair Baseline Vocal Quality: Clear Volitional Cough: Strong Volitional Swallow: Able to elicit Anatomy:  (likely osteophytes C5-6, C6-7 not confirmed by  radiologist) Pharyngeal Secretions: Not observed secondary MBS  Reason for  Referral:  Objectively evaluate swallowing function   Oral Phase Oral Preparation/Oral Phase Oral Phase: WFL Pharyngeal Phase  Pharyngeal Phase Pharyngeal Phase: Within functional limits Cervical Esophageal Phase  Cervical Esophageal Phase Cervical Esophageal Phase: [name]  [name] [name] 12/03/2012, 9:51 AM     Dg Swallowing Func-speech Pathology  11/19/2012   Lenor Derrick, CCC-SLP  11/19/2012  2:59 PM Objective Swallowing Evaluation: Modified Barium Swallowing Study   Patient Details  Name: Walter Frazier MRN: [id number] Date of Birth: Nov 26, 1955  Today's Date: 11/19/2012 Time: 4098-1191 SLP Time Calculation (min): 32 min  Past Medical History: History reviewed. No pertinent past medical  history. Past Surgical History: History reviewed. No pertinent past  surgical history. HPI:  UZZIEL RUSSEY is an 57 y.o. male with recent Cardiac arrest  and MI. Resp failure s/p tracheostomy, making slow progress.  Currently tolerating TF via NGT, but IR is requested to place  perc G-tube for expected prolonged dysphagia and PCM during  recovery.     Assessment / Plan / Recommendation Clinical Impression  Dysphagia Diagnosis: Mild oral phase dysphagia;Moderate  pharyngeal phase dysphagia Clinical impression: Pt. presents with a mild-moderate  oropharyngeal motor dysphagia, as evidenced by decreased bolus  formation and manipulation, decreased hyoid excursion and  laryngeal elevation, resulting in trace aspiration of thin  liquids, which pt. sensed and coughed aspirate from airway; (1  time penetration of nectar on first swallow) , and difficulty  chewing and manipulating solids in a timely manner.  Pt.'s  cognitive status placed him at a higher risk for aspiration, as  pt. is unable to follow compensatory strategies and is extremely  impulsive at this time.    Treatment Recommendation  Therapy as outlined in treatment plan below    Diet Recommendation Dysphagia 3 (Mechanical Soft);Nectar-thick  liquid (Chopped  meats)   Liquid Administration via: Cup;No straw Medication Administration: Whole meds with puree Supervision: Patient able to self feed;Full supervision/cueing  for compensatory strategies Compensations: Slow rate;Small sips/bites (Place PMSV for all  po's) Postural Changes and/or Swallow Maneuvers: Seated upright 90  degrees    Other  Recommendations Oral Care Recommendations: Oral care  QID;Staff/trained caregiver to provide oral care Other Recommendations: Place PMSV during PO intake;Clarify  dietary restrictions;Have oral suction available;Order thickener  from pharmacy;Prohibited food (jello, ice cream, thin soups)   Follow Up Recommendations  Inpatient Rehab    Frequency and Duration min 2x/week  2 weeks   Pertinent Vitals/Pain n/a    SLP Swallow Goals Patient will consume recommended diet without observed clinical  signs of aspiration with: Minimal assistance;Supervision/safety Patient will utilize recommended strategies during swallow to  increase swallowing safety with: Minimal  assistance;Supervision/safety   General HPI: TARYN NAVE is an 57 y.o. male with recent  Cardiac arrest and MI. Resp failure s/p tracheostomy, making slow  progress. Currently tolerating TF via NGT, but IR is requested to  place perc G-tube for expected prolonged dysphagia and PCM during  recovery. Type of Study: Modified Barium Swallowing Study Reason for Referral: Objectively evaluate swallowing function Previous Swallow Assessment: BSE 11/17/12, Cont. NPO Diet Prior to this Study: NPO Temperature Spikes Noted: No Respiratory Status: Trach Trach Size and Type: #6;With PMSV in place History of Recent Intubation: Yes Length of Intubations (days): 12 days Date extubated: 11/10/12 Behavior/Cognition: Alert;Cooperative;Pleasant  mood;Distractible;Requires cueing;Decreased sustained attention Oral Cavity - Dentition: Adequate natural dentition Oral Motor / Sensory Function: Within functional limits Self-Feeding Abilities: Able to  feed self;Needs assist  (Impulsive) Patient Positioning: Upright in chair Baseline Vocal Quality: Low vocal intensity Volitional Cough: Strong Volitional Swallow: Unable to elicit Anatomy:  (Trach in place) Pharyngeal Secretions: Not observed secondary MBS (Coughing  thick, tan, secretions (malordorous))    Reason for Referral Objectively evaluate swallowing function   Oral Phase Oral Preparation/Oral Phase Oral Phase: Impaired Oral - Solids Oral - Puree: Impaired mastication;Weak  lingual  manipulation;Piecemeal swallowing;Delayed oral transit Oral - Mechanical Soft: Impaired mastication;Weak lingual  manipulation;Piecemeal swallowing;Delayed oral transit Oral - Pill:  (Chewed pill)   Pharyngeal Phase Pharyngeal Phase Pharyngeal Phase: Impaired Pharyngeal - Nectar Pharyngeal - Nectar Teaspoon: Reduced epiglottic  inversion;Reduced anterior laryngeal mobility;Reduced laryngeal  elevation;Reduced airway/laryngeal closure;Reduced tongue base  retraction;Penetration/Aspiration during swallow (Initial swallow  only) Penetration/Aspiration details (nectar teaspoon): Material enters  airway, remains ABOVE vocal cords then ejected out (Initial  swallow only) Pharyngeal - Thin Pharyngeal - Thin Cup: Reduced epiglottic inversion;Reduced  anterior laryngeal mobility;Reduced laryngeal  elevation;Penetration/Aspiration during swallow;Trace aspiration Penetration/Aspiration details (thin cup): Material enters  airway, passes BELOW cords then ejected out Pharyngeal Phase - Comment Pharyngeal Comment: Positive cough response with aspiration, and  aspirate was cleared trom the trachea.  Cervical Esophageal Phase    GO    Cervical Esophageal Phase Cervical Esophageal Phase: Vicente Masson T 11/19/2012, 2:59 PM     Microbiology: No results found for this or any previous visit (from the past 240 hour(s)).   Labs: Basic Metabolic Panel:  Recent Labs Lab 12/02/12 0535 12/03/12 0610 12/08/12 0201 12/08/12 0755  NA   --  137 135 140  K  --  4.1 4.3 3.6  CL  --  102 103 105  CO2  --  26 27 27   GLUCOSE  --  102* 108* 112*  BUN  --  8 8 7   CREATININE 0.76 0.77 0.67 0.66  CALCIUM  --  8.1* 8.5 8.2*   Liver Function Tests:  Recent Labs Lab 12/03/12 0610  AST 19  ALT 20  ALKPHOS 114  BILITOT 0.2*  PROT 6.0  ALBUMIN 2.6*   No results found for this basename: LIPASE, AMYLASE,  in the last 168 hours No results found for this basename: AMMONIA,  in the last 168 hours CBC:  Recent Labs Lab 12/03/12 0610 12/08/12 0755  WBC 10.4 10.5  HGB 11.8* 11.9*  HCT 35.4* 35.4*  MCV 92.9 93.9  PLT 269 221   Cardiac Enzymes:  Recent Labs Lab 12/08/12 0201 12/08/12 0800  TROPONINI <0.30 0.35*   BNP: BNP (last 3 results)  Recent Labs  12/08/12 0201  PROBNP 923.2*   CBG: No results found for this basename: GLUCAP,  in the last 168 hours     Signed:  Jennafer Gladue  [facility] 12/08/2012, 3:34 PM

## 2012-12-08 NOTE — ED Notes (Signed)
Walter Frazier at bedside stated will write admission  odrers.

## 2012-12-08 NOTE — H&P (Signed)
[facility] History and Physical  JAMIS KRYDER ZOX:[id number] DOB: 10/12/1955 DOA: 12/08/2012  Referring physician: ER physician. PCP: No primary provider on file.  Specialists: [facility] cardiology.  Chief Complaint: Shortness of breath.  HPI: Walter Frazier is a 57 y.o. male who was recently had acute ST elevation MI and subsequently underwent cardiac arrest and hypoxic brain injury with seizures and had prolonged intubation and had tracheostomy which was eventually decannulated and itching had stayed in rehabilitation and was discharged home on July 3 started experiencing coughing spells on lying down with shortness of breath over the last 2 days. Patient states the coughing spell some nonproductive and feels like he was up about something but unable to. These spells have only on lying down flat otherwise patient has asymptomatic. Denies any fever chills chest pain. In the ER patient was found to be mildly wheezing and was given nebulizer which improved his wheezing. Chest x-ray discharge or any acute. Point-of-care troponin was elevated but regular one was normal. Patient at this time as been admitted for further management. Patient otherwise denies any nausea vomiting abdominal pain diarrhea or any difficulty swallowing.  Review of Systems: As presented in the history of presenting illness, rest negative.  Past Medical History  Diagnosis Date  . Coronary artery disease   . Myocardial infarct    Past Surgical History  Procedure Laterality Date  . Back surgery      ~20 years ago by Dr. Jeral Fruit   Social History:  reports that he has been smoking Cigarettes.  He has been smoking about 1.00 pack per day. He does not have any smokeless tobacco history on file. He reports that he drinks about 6.0 ounces of alcohol per week. He reports that he does not use illicit drugs. Home. where does patient live-- Can do ADLs. Can patient participate in ADLs?  No Known Allergies  Family  History  Problem Relation Age of Onset  . Heart disease Father       Prior to Admission medications   Medication Sig Start Date End Date Taking? Authorizing Provider  aspirin 81 MG chewable tablet Chew 1 tablet (81 mg total) by mouth daily. 12/04/12  Yes Daniel J Angiulli, PA-C  atorvastatin (LIPITOR) 80 MG tablet Take 1 tablet (80 mg total) by mouth daily at 6 PM. 12/04/12  Yes Mcarthur Rossetti Angiulli, PA-C  carvedilol (COREG) 6.25 MG tablet Take 1 tablet (6.25 mg total) by mouth 2 (two) times daily with a meal. 12/04/12  Yes Mcarthur Rossetti Angiulli, PA-C  clopidogrel (PLAVIX) 75 MG tablet Take 1 tablet (75 mg total) by mouth daily with breakfast. 12/04/12  Yes Mcarthur Rossetti Angiulli, PA-C  enalapril (VASOTEC) 2.5 MG tablet Take 1 tablet (2.5 mg total) by mouth daily. 12/04/12  Yes Daniel J Angiulli, PA-C  phenytoin (DILANTIN) 200 MG ER capsule Take 1 capsule (200 mg total) by mouth daily. 12/04/12  Yes Daniel J Angiulli, PA-C  phenytoin (DILANTIN) 300 MG ER capsule Take 1 capsule (300 mg total) by mouth at bedtime. 12/04/12  Yes Daniel J Angiulli, PA-C  QUEtiapine (SEROQUEL) 50 MG tablet Take 3 tablets (150 mg total) by mouth at bedtime. 12/04/12  Yes Daniel J Angiulli, PA-C  spironolactone (ALDACTONE) 12.5 mg TABS Take 0.5 tablets (12.5 mg total) by mouth daily. 12/04/12  Yes Daniel J Angiulli, PA-C  valproic acid (DEPAKENE) 250 MG capsule Take 3 capsules (750 mg total) by mouth 2 (two) times daily. 12/04/12  Yes Charlton Amor, PA-C   Physical  Exam: Filed Vitals:   12/08/12 0345 12/08/12 0400 12/08/12 0500 12/08/12 0600  BP: 103/73 96/66 104/81 110/62  Pulse: 82 82 80 91  Temp:      TempSrc:      Resp: 14 19 10 22   SpO2: 99% 97% 100% 97%     General:  Well-developed and nourished.  Eyes: anicteric no pallor.  ENT: no discharge from ears eyes nose mouth.  Neck: no discharge from the tracheostomy site.  Cardiovascular: S1-S2 heard.  Respiratory: presently has no rhonchi or crepitations.  Abdomen: soft  nontender bowel sounds present.  Skin: no rash.  Musculoskeletal: no edema.  Psychiatric: appears normal.  Neurologic: alert awake oriented to time place and person. Moves all extremities.  Labs on Admission:  Basic Metabolic Panel:  Recent Labs Lab 12/02/12 0535 12/03/12 0610 12/08/12 0201  NA  --  137 135  K  --  4.1 4.3  CL  --  102 103  CO2  --  26 27  GLUCOSE  --  102* 108*  BUN  --  8 8  CREATININE 0.76 0.77 0.67  CALCIUM  --  8.1* 8.5   Liver Function Tests:  Recent Labs Lab 12/03/12 0610  AST 19  ALT 20  ALKPHOS 114  BILITOT 0.2*  PROT 6.0  ALBUMIN 2.6*   No results found for this basename: LIPASE, AMYLASE,  in the last 168 hours No results found for this basename: AMMONIA,  in the last 168 hours CBC:  Recent Labs Lab 12/03/12 0610  WBC 10.4  HGB 11.8*  HCT 35.4*  MCV 92.9  PLT 269   Cardiac Enzymes:  Recent Labs Lab 12/08/12 0201  TROPONINI <0.30    BNP (last 3 results)  Recent Labs  12/08/12 0201  PROBNP 923.2*   CBG: No results found for this basename: GLUCAP,  in the last 168 hours  Radiological Exams on Admission: Dg Chest Port 1 View  12/08/2012   *RADIOLOGY REPORT*  Clinical Data: Shortness of breath.  PORTABLE CHEST - 1 VIEW  Comparison: 11/11/2012  Findings: Interval removal of tracheostomy and central venous catheters.  Enteric tube has also been removed.  Shallow inspiration with some elevation of the left hemidiaphragm. The heart size and pulmonary vascularity are normal. The lungs appear clear and expanded without focal air space disease or consolidation. No blunting of the costophrenic angles.  No pneumothorax.  Mediastinal contours appear intact.  IMPRESSION: Interval removal appliances.  No evidence of active pulmonary disease.   Original Report Authenticated By: Burman Nieves, M.D.    EKG: Independently reviewed. Normal sinus rhythm with ST-T changes comparable to the old EKG. Will discuss with cardiology about  EKG.  Assessment/Plan Principal Problem:   Shortness of breath Active Problems:   CAD (coronary artery disease)   Seizure   HTN (hypertension)   1. Shortness of breath - cause is not clear but could be secondary to CHF(EF 25-30%) versus bronchitis. Continue present does of diuretics along with nebulizer. I have consulted cardiology for their recommendations. Presently patient is afebrile and does not have any leukocytosis or any infiltrates so I have not placed on antibiotics. 2. CAD status post stenting status post cardiac arrest - presently denies any chest pain. Continue present medications. 3. Seizures - continue present medications. Check Dilantin and valproic acid levels. 4. Hyperlipidemia - continue present medications.    Code Status: full code.  Family Communication: patient's wife at the bedside.  Disposition Plan: admit to inpatient.    Midge Minium  NMarland Kitchen [facility] Pager [phone].  If 7PM-7AM, please contact night-coverage [email or web address] Password [facility] 12/08/2012, 6:38 AM

## 2012-12-08 NOTE — ED Notes (Addendum)
Pt alert, NAD, calm, interactive, skin W&D, resps e/u, speaking in soft hoarse voice, c/o cough and sob, "feels it mostly in the throat", (denies: pain, nvd, fever, dizziness, productive cough, palpitations, dizziness, numbness, tingling or other sx), NSR, VSS, LS CTA, MI ~ 1 month ago, recent removal of trache and d/c last week from rehab, family at [facility], pt of Dr. Graciela Husbands card MD.

## 2012-12-08 NOTE — ED Notes (Signed)
The pt has had difficulty breathing for 3-4 days.  He was just in the hosp until this pas t Thursday.  He had a trach while he was here and it was removed.  No pain

## 2012-12-08 NOTE — ED Notes (Signed)
i stat troponin results given to Dr. Blinda Leatherwood by B. Bing Plume, EMT

## 2012-12-08 NOTE — ED Notes (Signed)
The pt had a cardiac arrest on may 29th with a major mi

## 2012-12-10 ENCOUNTER — Encounter: Payer: Self-pay | Admitting: Physical Medicine & Rehabilitation

## 2012-12-11 ENCOUNTER — Emergency Department (HOSPITAL_COMMUNITY): Payer: BC Managed Care – PPO

## 2012-12-11 ENCOUNTER — Encounter (HOSPITAL_COMMUNITY): Payer: Self-pay | Admitting: *Deleted

## 2012-12-11 ENCOUNTER — Ambulatory Visit: Payer: BC Managed Care – PPO | Admitting: Cardiovascular Disease

## 2012-12-11 ENCOUNTER — Inpatient Hospital Stay (HOSPITAL_COMMUNITY)
Admission: AD | Admit: 2012-12-11 | Discharge: 2012-12-13 | DRG: 541 | Disposition: A | Discharge status: Home / Self Care | Payer: BC Managed Care – PPO | Attending: Internal Medicine | Admitting: Internal Medicine

## 2012-12-11 DIAGNOSIS — J441 Chronic obstructive pulmonary disease with (acute) exacerbation: Principal | ICD-10-CM

## 2012-12-11 DIAGNOSIS — I469 Cardiac arrest, cause unspecified: Secondary | ICD-10-CM

## 2012-12-11 DIAGNOSIS — I5021 Acute systolic (congestive) heart failure: Secondary | ICD-10-CM

## 2012-12-11 DIAGNOSIS — I255 Ischemic cardiomyopathy: Secondary | ICD-10-CM | POA: Diagnosis present

## 2012-12-11 DIAGNOSIS — I252 Old myocardial infarction: Secondary | ICD-10-CM

## 2012-12-11 DIAGNOSIS — I2510 Atherosclerotic heart disease of native coronary artery without angina pectoris: Secondary | ICD-10-CM

## 2012-12-11 DIAGNOSIS — F17200 Nicotine dependence, unspecified, uncomplicated: Secondary | ICD-10-CM

## 2012-12-11 DIAGNOSIS — Z122 Encounter for screening for malignant neoplasm of respiratory organs: Secondary | ICD-10-CM

## 2012-12-11 DIAGNOSIS — I2589 Other forms of chronic ischemic heart disease: Secondary | ICD-10-CM | POA: Diagnosis present

## 2012-12-11 DIAGNOSIS — R0989 Other specified symptoms and signs involving the circulatory and respiratory systems: Secondary | ICD-10-CM

## 2012-12-11 DIAGNOSIS — R061 Stridor: Secondary | ICD-10-CM

## 2012-12-11 DIAGNOSIS — N390 Urinary tract infection, site not specified: Secondary | ICD-10-CM

## 2012-12-11 DIAGNOSIS — Z87891 Personal history of nicotine dependence: Secondary | ICD-10-CM

## 2012-12-11 DIAGNOSIS — Z01810 Encounter for preprocedural cardiovascular examination: Secondary | ICD-10-CM

## 2012-12-11 DIAGNOSIS — I213 ST elevation (STEMI) myocardial infarction of unspecified site: Secondary | ICD-10-CM

## 2012-12-11 DIAGNOSIS — I10 Essential (primary) hypertension: Secondary | ICD-10-CM | POA: Diagnosis present

## 2012-12-11 DIAGNOSIS — Z9889 Other specified postprocedural states: Secondary | ICD-10-CM

## 2012-12-11 DIAGNOSIS — Z930 Tracheostomy status: Secondary | ICD-10-CM

## 2012-12-11 DIAGNOSIS — G9340 Encephalopathy, unspecified: Secondary | ICD-10-CM

## 2012-12-11 DIAGNOSIS — E785 Hyperlipidemia, unspecified: Secondary | ICD-10-CM

## 2012-12-11 DIAGNOSIS — G40901 Epilepsy, unspecified, not intractable, with status epilepticus: Secondary | ICD-10-CM

## 2012-12-11 DIAGNOSIS — I472 Ventricular tachycardia, unspecified: Secondary | ICD-10-CM

## 2012-12-11 DIAGNOSIS — Z720 Tobacco use: Secondary | ICD-10-CM

## 2012-12-11 DIAGNOSIS — G931 Anoxic brain damage, not elsewhere classified: Secondary | ICD-10-CM

## 2012-12-11 DIAGNOSIS — IMO0002 Reserved for concepts with insufficient information to code with codable children: Secondary | ICD-10-CM

## 2012-12-11 DIAGNOSIS — R0602 Shortness of breath: Secondary | ICD-10-CM | POA: Diagnosis present

## 2012-12-11 DIAGNOSIS — I5023 Acute on chronic systolic (congestive) heart failure: Secondary | ICD-10-CM | POA: Diagnosis present

## 2012-12-11 DIAGNOSIS — R0600 Dyspnea, unspecified: Secondary | ICD-10-CM

## 2012-12-11 DIAGNOSIS — J9601 Acute respiratory failure with hypoxia: Secondary | ICD-10-CM

## 2012-12-11 DIAGNOSIS — R569 Unspecified convulsions: Secondary | ICD-10-CM

## 2012-12-11 DIAGNOSIS — J449 Chronic obstructive pulmonary disease, unspecified: Secondary | ICD-10-CM | POA: Diagnosis present

## 2012-12-11 DIAGNOSIS — I509 Heart failure, unspecified: Secondary | ICD-10-CM | POA: Diagnosis present

## 2012-12-11 HISTORY — DX: Stridor: R06.1

## 2012-12-11 HISTORY — DX: Unspecified convulsions: R56.9

## 2012-12-11 HISTORY — DX: Acute myocardial infarction, unspecified: I21.9

## 2012-12-11 HISTORY — DX: Shortness of breath: R06.02

## 2012-12-11 HISTORY — DX: Encounter for screening for malignant neoplasm of respiratory organs: Z12.2

## 2012-12-11 LAB — CBC
Hemoglobin: 12.7 g/dL — ABNORMAL LOW (ref 13.0–17.0)
MCH: 31.7 pg (ref 26.0–34.0)
MCV: 94.3 fL (ref 78.0–100.0)
Platelets: 219 10*3/uL (ref 150–400)
RBC: 4.01 MIL/uL — ABNORMAL LOW (ref 4.22–5.81)
WBC: 8.6 10*3/uL (ref 4.0–10.5)

## 2012-12-11 LAB — CBC WITH DIFFERENTIAL/PLATELET
Basophils Absolute: 0.1 10*3/uL (ref 0.0–0.1)
Basophils Relative: 1 % (ref 0–1)
Eosinophils Absolute: 0.1 10*3/uL (ref 0.0–0.7)
Eosinophils Relative: 1 % (ref 0–5)
Monocytes Absolute: 1 10*3/uL (ref 0.1–1.0)
Monocytes Relative: 9 % (ref 3–12)
RBC: 3.93 MIL/uL — ABNORMAL LOW (ref 4.22–5.81)
WBC: 10.8 10*3/uL — ABNORMAL HIGH (ref 4.0–10.5)

## 2012-12-11 LAB — COMPREHENSIVE METABOLIC PANEL
AST: 14 U/L (ref 0–37)
Albumin: 2.8 g/dL — ABNORMAL LOW (ref 3.5–5.2)
BUN: 8 mg/dL (ref 6–23)
Calcium: 8.6 mg/dL (ref 8.4–10.5)
Creatinine, Ser: 0.71 mg/dL (ref 0.50–1.35)
GFR calc non Af Amer: >90 mL/min (ref >90)
Total Bilirubin: 0.1 mg/dL — ABNORMAL LOW (ref 0.3–1.2)

## 2012-12-11 LAB — CREATININE, SERUM
Creatinine, Ser: 0.75 mg/dL (ref 0.50–1.35)
GFR calc Af Amer: >90 mL/min (ref >90)

## 2012-12-11 LAB — TROPONIN I: Troponin I: <0.3 ng/mL (ref <0.30)

## 2012-12-11 LAB — PRO B NATRIURETIC PEPTIDE: Pro B Natriuretic peptide (BNP): 965.2 pg/mL — ABNORMAL HIGH (ref 0–125)

## 2012-12-11 MED ORDER — QUETIAPINE FUMARATE 50 MG PO TABS
150.0000 mg | ORAL_TABLET | Freq: Every day | ORAL | Status: DC
  Administered 2012-12-11 – 2012-12-12 (×2): 150 mg via ORAL
  Filled 2012-12-11 (×3): qty 1

## 2012-12-11 MED ORDER — ATORVASTATIN CALCIUM 80 MG PO TABS
80.0000 mg | ORAL_TABLET | Freq: Every day | ORAL | Status: DC
  Administered 2012-12-11 – 2012-12-12 (×2): 80 mg via ORAL
  Filled 2012-12-11 (×3): qty 1

## 2012-12-11 MED ORDER — SODIUM CHLORIDE 0.9 % IJ SOLN
3.0000 mL | Freq: Two times a day (BID) | INTRAMUSCULAR | Status: DC
  Administered 2012-12-11 – 2012-12-13 (×3): 3 mL via INTRAVENOUS

## 2012-12-11 MED ORDER — VALPROIC ACID 250 MG PO CAPS
750.0000 mg | ORAL_CAPSULE | Freq: Two times a day (BID) | ORAL | Status: DC
  Administered 2012-12-11 – 2012-12-13 (×5): 750 mg via ORAL
  Filled 2012-12-11 (×6): qty 3

## 2012-12-11 MED ORDER — METHYLPREDNISOLONE SODIUM SUCC 40 MG IJ SOLR
40.0000 mg | Freq: Three times a day (TID) | INTRAMUSCULAR | Status: DC
  Administered 2012-12-11 – 2012-12-13 (×7): 40 mg via INTRAVENOUS
  Filled 2012-12-11 (×9): qty 1

## 2012-12-11 MED ORDER — LIDOCAINE HCL (PF) 1 % IJ SOLN
INTRAMUSCULAR | Status: AC
  Filled 2012-12-11: qty 5

## 2012-12-11 MED ORDER — LIDOCAINE VISCOUS 2 % MT SOLN
15.0000 mL | Freq: Once | OROMUCOSAL | Status: DC
  Filled 2012-12-11: qty 15

## 2012-12-11 MED ORDER — DOCUSATE SODIUM 100 MG PO CAPS
100.0000 mg | ORAL_CAPSULE | Freq: Two times a day (BID) | ORAL | Status: DC
  Administered 2012-12-11 – 2012-12-13 (×5): 100 mg via ORAL
  Filled 2012-12-11 (×6): qty 1

## 2012-12-11 MED ORDER — ALUM & MAG HYDROXIDE-SIMETH 200-200-20 MG/5ML PO SUSP: 30.0000 mL | Freq: Four times a day (QID) | ORAL | Status: DC | PRN

## 2012-12-11 MED ORDER — SODIUM CHLORIDE 0.9 % IJ SOLN
3.0000 mL | Freq: Two times a day (BID) | INTRAMUSCULAR | Status: DC
  Administered 2012-12-11 – 2012-12-13 (×4): 3 mL via INTRAVENOUS

## 2012-12-11 MED ORDER — GUAIFENESIN ER 600 MG PO TB12
1200.0000 mg | ORAL_TABLET | Freq: Two times a day (BID) | ORAL | Status: DC
  Administered 2012-12-11 – 2012-12-13 (×5): 1200 mg via ORAL
  Filled 2012-12-11 (×6): qty 2

## 2012-12-11 MED ORDER — ONDANSETRON HCL 4 MG/2ML IJ SOLN: 4.0000 mg | Freq: Four times a day (QID) | INTRAMUSCULAR | Status: DC | PRN

## 2012-12-11 MED ORDER — TRAZODONE HCL 50 MG PO TABS
50.0000 mg | ORAL_TABLET | Freq: Every evening | ORAL | Status: DC | PRN
  Administered 2012-12-11: 50 mg via ORAL
  Filled 2012-12-11: qty 1

## 2012-12-11 MED ORDER — PNEUMOCOCCAL VAC POLYVALENT 25 MCG/0.5ML IJ INJ
0.5000 mL | INJECTION | INTRAMUSCULAR | Status: AC
  Filled 2012-12-11: qty 0.5

## 2012-12-11 MED ORDER — PHENYTOIN SODIUM EXTENDED 100 MG PO CAPS
300.0000 mg | ORAL_CAPSULE | Freq: Every day | ORAL | Status: DC
  Administered 2012-12-11 – 2012-12-12 (×2): 300 mg via ORAL
  Filled 2012-12-11 (×3): qty 3

## 2012-12-11 MED ORDER — ENALAPRIL MALEATE 2.5 MG PO TABS
2.5000 mg | ORAL_TABLET | Freq: Every day | ORAL | Status: DC
  Administered 2012-12-11 – 2012-12-13 (×3): 2.5 mg via ORAL
  Filled 2012-12-11 (×3): qty 1

## 2012-12-11 MED ORDER — ENOXAPARIN SODIUM 40 MG/0.4ML ~~LOC~~ SOLN
40.0000 mg | SUBCUTANEOUS | Status: DC
  Administered 2012-12-11 – 2012-12-13 (×3): 40 mg via SUBCUTANEOUS
  Filled 2012-12-11 (×4): qty 0.4

## 2012-12-11 MED ORDER — MAGNESIUM HYDROXIDE 400 MG/5ML PO SUSP: 30.0000 mL | Freq: Every day | ORAL | Status: DC | PRN

## 2012-12-11 MED ORDER — ASPIRIN 81 MG PO CHEW
81.0000 mg | CHEWABLE_TABLET | Freq: Every day | ORAL | Status: DC
  Administered 2012-12-11 – 2012-12-13 (×3): 81 mg via ORAL
  Filled 2012-12-11: qty 1

## 2012-12-11 MED ORDER — ONDANSETRON HCL 4 MG PO TABS: 4.0000 mg | ORAL_TABLET | Freq: Four times a day (QID) | ORAL | Status: DC | PRN

## 2012-12-11 MED ORDER — SODIUM CHLORIDE 0.9 % IJ SOLN
3.0000 mL | INTRAMUSCULAR | Status: DC | PRN
  Administered 2012-12-13: 3 mL via INTRAVENOUS

## 2012-12-11 MED ORDER — SODIUM CHLORIDE 0.9 % IV SOLN: 250.0000 mL | INTRAVENOUS | Status: DC | PRN

## 2012-12-11 MED ORDER — ACETAMINOPHEN 325 MG PO TABS: 650.0000 mg | ORAL_TABLET | Freq: Four times a day (QID) | ORAL | Status: DC | PRN

## 2012-12-11 MED ORDER — CLOPIDOGREL BISULFATE 75 MG PO TABS
75.0000 mg | ORAL_TABLET | Freq: Every day | ORAL | Status: DC
  Administered 2012-12-11 – 2012-12-13 (×3): 75 mg via ORAL
  Filled 2012-12-11 (×4): qty 1

## 2012-12-11 MED ORDER — ACETAMINOPHEN 650 MG RE SUPP: 650.0000 mg | Freq: Four times a day (QID) | RECTAL | Status: DC | PRN

## 2012-12-11 MED ORDER — FUROSEMIDE 10 MG/ML IJ SOLN
40.0000 mg | Freq: Every day | INTRAMUSCULAR | Status: DC
  Administered 2012-12-11 – 2012-12-13 (×3): 40 mg via INTRAVENOUS
  Filled 2012-12-11 (×3): qty 4

## 2012-12-11 MED ORDER — PHENYTOIN SODIUM EXTENDED 100 MG PO CAPS
200.0000 mg | ORAL_CAPSULE | Freq: Every day | ORAL | Status: DC
  Administered 2012-12-11 – 2012-12-13 (×3): 200 mg via ORAL
  Filled 2012-12-11 (×3): qty 2

## 2012-12-11 MED ORDER — LEVALBUTEROL HCL 0.63 MG/3ML IN NEBU
0.6300 mg | INHALATION_SOLUTION | Freq: Four times a day (QID) | RESPIRATORY_TRACT | Status: DC
  Administered 2012-12-11 – 2012-12-12 (×6): 0.63 mg via RESPIRATORY_TRACT
  Filled 2012-12-11 (×10): qty 3

## 2012-12-11 MED ORDER — CARVEDILOL 6.25 MG PO TABS
6.2500 mg | ORAL_TABLET | Freq: Two times a day (BID) | ORAL | Status: DC
  Administered 2012-12-11 – 2012-12-13 (×4): 6.25 mg via ORAL
  Filled 2012-12-11 (×6): qty 1

## 2012-12-11 NOTE — Progress Notes (Signed)
Physicians, cardiology and hospitalist notified of EKG result.  No new orders given after evaluation.  Vital signs obtained and charted.  Rapid Response also notified.  Client denies any pain, sob, n/v.  Skin is warm and dry to touch.  Alert and oriented to surroundings.  Charge nurse aware of issue and result of physicians being notified.

## 2012-12-11 NOTE — ED Notes (Signed)
Admitting MD and Critical care at bedside to consult patient. [id number] called and notified there will be a delay in arriving upstairs.

## 2012-12-11 NOTE — Consult Note (Signed)
PULMONARY  / CRITICAL CARE MEDICINE  Name: Walter Frazier MRN: [id number] DOB: 1956-04-12    ADMISSION DATE:  12/11/2012 CONSULTATION DATE:  7/10  REFERRING MD :  Arthor Captain PRIMARY SERVICE:  [facility]  CHIEF COMPLAINT:  SOB  BRIEF PATIENT DESCRIPTION: 57yo male with hx VT arrest, hypothermia protocol and subsequent prolonged hospitalization with trach.  Recent re-admit (d/c 7/7) with ?CHF.  Returns 7/10 with c/o SOB.  Admitted by [facility], PCCM consult.   SIGNIFICANT EVENTS / STUDIES:    LINES / TUBES: none  CULTURES: none  ANTIBIOTICS: none  HISTORY OF PRESENT ILLNESS:  57yo male with hx VT arrest, hypothermia protocol and subsequent prolonged hospitalization with trach.  Recent re-admit (d/c 7/7) with ?CHF.  Returns 7/10 with c/o SOB.  SOB worse when lying flat.  Feels like SOB is "where the trach was". Also c/o cough with thin, white sputum.  Denies chest pain, BLE edema, hemoptysis, difficulty swallowing.    PAST MEDICAL HISTORY :  Past Medical History  Diagnosis Date  . Coronary artery disease     a. VT Arrest/Ant STEMI- Cath/PCI: DES to LAD (no other details of cath/pci in Epic).  . Ischemic cardiomyopathy     a. 11/2012 Echo: Ef 25-30%, septal apical & ant HK. No apical thrombus.  . Anoxic encephalopathy     a. s/p VT arrest 10/2012.  . Seizure disorder     a. in setting of encephalopathy post VT arrest 11/2012.  Marland Kitchen Hypertension   . Hyperlipidemia   . Dysphagia     a. 11/2012 s/p arrest with prolonged intubation and Trach.  . Tobacco abuse    Past Surgical History  Procedure Laterality Date  . Back surgery      ~20 years ago by Dr. Jeral Fruit  . Cardiac catheterization    . Coronary angioplasty with stent placement     Prior to Admission medications   Medication Sig Start Date End Date Taking? Authorizing Provider  albuterol (PROVENTIL HFA;VENTOLIN HFA) 108 (90 BASE) MCG/ACT inhaler Inhale 2 puffs into the lungs every 6 (six) hours as needed for wheezing. 12/08/12  Yes  Zannie Cove, MD  aspirin 81 MG chewable tablet Chew 1 tablet (81 mg total) by mouth daily. 12/04/12  Yes Daniel J Angiulli, PA-C  atorvastatin (LIPITOR) 80 MG tablet Take 1 tablet (80 mg total) by mouth daily at 6 PM. 12/04/12  Yes Mcarthur Rossetti Angiulli, PA-C  carvedilol (COREG) 6.25 MG tablet Take 1 tablet (6.25 mg total) by mouth 2 (two) times daily with a meal. 12/04/12  Yes Mcarthur Rossetti Angiulli, PA-C  clopidogrel (PLAVIX) 75 MG tablet Take 1 tablet (75 mg total) by mouth daily with breakfast. 12/04/12  Yes Mcarthur Rossetti Angiulli, PA-C  enalapril (VASOTEC) 2.5 MG tablet Take 1 tablet (2.5 mg total) by mouth daily. 12/04/12  Yes Daniel J Angiulli, PA-C  furosemide (LASIX) 20 MG tablet Take 1 tablet (20 mg total) by mouth daily as needed (for weight gain >2 lbs, swelling etc). 12/08/12  Yes Zannie Cove, MD  phenytoin (DILANTIN) 200 MG ER capsule Take 1 capsule (200 mg total) by mouth daily. 12/04/12  Yes Daniel J Angiulli, PA-C  phenytoin (DILANTIN) 300 MG ER capsule Take 1 capsule (300 mg total) by mouth at bedtime. 12/04/12  Yes Daniel J Angiulli, PA-C  QUEtiapine (SEROQUEL) 50 MG tablet Take 3 tablets (150 mg total) by mouth at bedtime. 12/04/12  Yes Daniel J Angiulli, PA-C  spironolactone (ALDACTONE) 12.5 mg TABS Take 1 tablet (25 mg total) by  mouth daily. 12/08/12  Yes Zannie Cove, MD  valproic acid (DEPAKENE) 250 MG capsule Take 3 capsules (750 mg total) by mouth 2 (two) times daily. 12/04/12  Yes Daniel J Angiulli, PA-C   No Known Allergies  FAMILY HISTORY:  Family History  Problem Relation Age of Onset  . Heart disease Father    SOCIAL HISTORY:  reports that he has quit smoking. His smoking use included Cigarettes. He has a 30 pack-year smoking history. He does not have any smokeless tobacco history on file. He reports that he drinks about 6.0 ounces of alcohol per week. He reports that he does not use illicit drugs.  REVIEW OF SYSTEMS:   As per HPI - all other systems reviewed and were neg.    VITAL  SIGNS: Temp:  [98.2 F (36.8 C)-98.6 F (37 C)] 98.6 F (37 C) (07/10 0845) Pulse Rate:  [68-75] 70 (07/10 0845) Resp:  [14-19] 19 (07/10 0845) BP: (98-107)/(62-75) 107/64 mmHg (07/10 0845) SpO2:  [100 %] 100 % (07/10 0845)  PHYSICAL EXAMINATION: General:  wdwn male, NAD Neuro:  Awake, alert, appropriate, MAE HEENT:  Mm moist, no JVD, trach scar  Cardiovascular:  s1s2 rrr Lungs:  resps even non labored on [place], mild stridor at times, exp wheeze  Abdomen:  Soft, +bs Musculoskeletal:  Warm and dry no edema    Recent Labs Lab 12/08/12 0201 12/08/12 0755 12/11/12 0550  NA 135 140 139  K 4.3 3.6 3.7  CL 103 105 102  CO2 27 27 26   BUN 8 7 8   CREATININE 0.67 0.66 0.71  GLUCOSE 108* 112* 124*    Recent Labs Lab 12/08/12 0755 12/11/12 0550  HGB 11.9* 12.5*  HCT 35.4* 37.0*  WBC 10.5 10.8*  PLT 221 228   Dg Neck Soft Tissue  12/11/2012   *RADIOLOGY REPORT*  Clinical Data: Shortness of breath  NECK SOFT TISSUES - 1+ VIEW  Comparison: None.  Findings: Technically limited study due to overlying tubing.  As visualized, the epiglottis is not thickened.  There may be some thickening of the aryepiglottic folds which can be associated with croup.  Visualized airway appears patent on single view.  No submental soft tissue swelling.  Visualized cervical vertebra demonstrate normal alignment.  IMPRESSION: Suggestion of thickening of the aryepiglottic fold which may be associated with croup.   Original Report Authenticated By: Burman Nieves, M.D.   Dg Chest 2 View  12/11/2012   *RADIOLOGY REPORT*  Clinical Data: Shortness of breath and wheezing.  CHEST - 2 VIEW  Comparison: 12/08/2012  Findings: Normal heart size and pulmonary vascularity. Emphysematous changes and scattered fibrosis in the lungs.  No focal consolidation or airspace disease.  No blunting of costophrenic angles.  Peribronchial thickening suggesting chronic bronchitis.  Healing right anterior rib fractures.  Degenerative  changes in the spine.  IMPRESSION: Emphysema and chronic bronchitic changes.  No evidence of active pulmonary disease.   Original Report Authenticated By: Burman Nieves, M.D.    ASSESSMENT / PLAN:  Dyspnea - likely multifactorial r/t ?COPD (extensive smoking hx) +/- some element tracheal stenosis or VCD. Pt self decannulated during initial prolonged hospitalization causing some trauma at the time.  Doubt CHF main issue here.    PLAN -  -Would recommend ENT eval - will hold off on airway examination for now as ENT will likely want to do this as well  -Will start low dose steroids  -O2 as needed  -Will add BD   WHITEHEART,KATHRYN, NP 12/11/2012  9:42 AM  Pager: [phone] or [phone]    I have interviewed and examined the patient and reviewed the database. I have formulated the assessment and plan as reflected in the note above with amendments made by me.   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 We have contacted ENT. It is great to see what a remarkable recovery he has made. With ENT managing the airway, there is little further for PCCM to offer. Please call if we can be of further assistance  Billy Fischer, MD;  PCCM service; Mobile [phone]

## 2012-12-11 NOTE — ED Notes (Signed)
Pt c/o shortness of breath. Pt was discharged from hospital two days ago. Pt has labored respirations, and dry cough. Family at bedside. Pt is A&Ox4, skin warm and dry

## 2012-12-11 NOTE — ED Notes (Signed)
MD at bedside. 

## 2012-12-11 NOTE — H&P (Signed)
Addendum  Patient seen and examined, chart and data base reviewed.  I agree with the above assessment and plan.  For full details please see Mrs. Algis Downs PA note.  SOB and stridor, seen by Dr. 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  We will consult cardiology prior to the procedure.   Clint Lipps, MD [facility] Pager: [phone] 12/11/2012, 12:20 PM

## 2012-12-11 NOTE — H&P (Signed)
[facility] History and Physical  STANFORD STRAUCH BJY:[id number] DOB: August 31, 1955 DOA: 12/11/2012  Referring physician: Dr. Judd Lien of the ER PCP: No PCP Per Patient  He sees Dr. Aline August in [place], [place] Specialists:   Chief Complaint: short of breath with inspiratory stridor  HPI: Walter Frazier is a 57 y.o. male who experienced V. fib arrest with anterior STEMI STATUS post drug-eluting stent to his LAD on May 14 2 had a prolonged hospitalization and intubation with hyperthermia therapy. He was discharged home on July 3, but returned to the emergency department on July 7 with shortness of breath. At that time he was felt to be in mild congestive heart failure and was treated with IV Lasix. He improved and was discharged home from the emergency department. Today (7/10) he presents with shortness of breath. He states that initially oral Lasix and the albuterol inhaler helped but now they do not. He has to sleep sitting up. He feels as though he needs to cough up phlegm but is unable to do so. He awoke suddenly at 3 AM this morning unable to breathe. This prompted his visit to the emergency room today. Further he and his wife are concerned about inspiratory stridor he has had since extubation. He has no difficulty swallowing, no sore throat, no hemoptysis. He is eating well. Apparently has no other signs of systemic illness. He denies chest pain. He smoked a pack a day for 40 years but quit when he had his cardiac arrest in May. Chest x-ray in the emergency department shows emphysema with chronic bronchitic changes. Soft tissue x-rays of the neck show thickening of the area epiglottic fold. He has moderate wheezing throughout his lung fields, but is not in respiratory distress.   Review of Systems: The patient denies anorexia, fever, weight loss,, vision loss, decreased hearing, hoarseness, chest pain, syncope, peripheral edema, balance deficits, hemoptysis, abdominal pain, melena, hematochezia,  severe indigestion/heartburn, hematuria, incontinence, genital sores, muscle weakness, suspicious skin lesions, transient blindness, difficulty walking, depression, unusual weight change, abnormal bleeding, enlarged lymph nodes.   Past Medical History  Diagnosis Date  . Coronary artery disease     a. VT Arrest/Ant STEMI- Cath/PCI: DES to LAD (no other details of cath/pci in Epic).  . Ischemic cardiomyopathy     a. 11/2012 Echo: Ef 25-30%, septal apical & ant HK. No apical thrombus.  . Anoxic encephalopathy     a. s/p VT arrest 10/2012.  . Seizure disorder     a. in setting of encephalopathy post VT arrest 11/2012.  Marland Kitchen Hypertension   . Hyperlipidemia   . Dysphagia     a. 11/2012 s/p arrest with prolonged intubation and Trach.  . Tobacco abuse    Past Surgical History  Procedure Laterality Date  . Back surgery      ~20 years ago by Dr. Jeral Fruit  . Cardiac catheterization    . Coronary angioplasty with stent placement     Social History:  reports that he has quit smoking. His smoking use included Cigarettes. He has a 30 pack-year smoking history. He does not have any smokeless tobacco history on file. He reports that he drinks about 6.0 ounces of alcohol per week. He reports that he does not use illicit drugs. Lives at home with his wife and is independent with his ADLs.  No Known Allergies  Family History  Problem Relation Age of Onset  . Heart disease Father     Prior to Admission medications   Medication Sig Start  Date End Date Taking? Authorizing Provider  albuterol (PROVENTIL HFA;VENTOLIN HFA) 108 (90 BASE) MCG/ACT inhaler Inhale 2 puffs into the lungs every 6 (six) hours as needed for wheezing. 12/08/12  Yes Zannie Cove, MD  aspirin 81 MG chewable tablet Chew 1 tablet (81 mg total) by mouth daily. 12/04/12  Yes Daniel J Angiulli, PA-C  atorvastatin (LIPITOR) 80 MG tablet Take 1 tablet (80 mg total) by mouth daily at 6 PM. 12/04/12  Yes Mcarthur Rossetti Angiulli, PA-C  carvedilol (COREG)  6.25 MG tablet Take 1 tablet (6.25 mg total) by mouth 2 (two) times daily with a meal. 12/04/12  Yes Mcarthur Rossetti Angiulli, PA-C  clopidogrel (PLAVIX) 75 MG tablet Take 1 tablet (75 mg total) by mouth daily with breakfast. 12/04/12  Yes Mcarthur Rossetti Angiulli, PA-C  enalapril (VASOTEC) 2.5 MG tablet Take 1 tablet (2.5 mg total) by mouth daily. 12/04/12  Yes Daniel J Angiulli, PA-C  furosemide (LASIX) 20 MG tablet Take 1 tablet (20 mg total) by mouth daily as needed (for weight gain >2 lbs, swelling etc). 12/08/12  Yes Zannie Cove, MD  phenytoin (DILANTIN) 200 MG ER capsule Take 1 capsule (200 mg total) by mouth daily. 12/04/12  Yes Daniel J Angiulli, PA-C  phenytoin (DILANTIN) 300 MG ER capsule Take 1 capsule (300 mg total) by mouth at bedtime. 12/04/12  Yes Daniel J Angiulli, PA-C  QUEtiapine (SEROQUEL) 50 MG tablet Take 3 tablets (150 mg total) by mouth at bedtime. 12/04/12  Yes Daniel J Angiulli, PA-C  spironolactone (ALDACTONE) 12.5 mg TABS Take 1 tablet (25 mg total) by mouth daily. 12/08/12  Yes Zannie Cove, MD  valproic acid (DEPAKENE) 250 MG capsule Take 3 capsules (750 mg total) by mouth 2 (two) times daily. 12/04/12  Yes Mcarthur Rossetti Angiulli, PA-C   Physical Exam: Filed Vitals:   12/11/12 0757 12/11/12 0845 12/11/12 0900 12/11/12 1109  BP: 98/62 107/64 105/65   Pulse: 68 70 77   Temp:  98.6 F (37 C) 98 F (36.7 C)   TempSrc:  Oral Oral   Resp: 15 19 18    Height:   5\' 11"  (1.803 m)   Weight:   68.8 kg (151 lb 10.8 oz)   SpO2: 100% 100% 100% 98%     General:  Wd, Thin male in no acute distress, wearing nasal canula for oxygen  Eyes: pupils equal and round, no icterus  ENT: no exudates or erythema in oropharynx, mmm  Neck: supple, no jvd, no lymphadenopathy  Cardiovascular: rrr, no LE edema  Respiratory: Mod expiratory wheeze in each lung field, inspiratory stridor.  Abdomen: thin soft, nt, nd, +bs, no masses  Skin: no rashes, bruises, lesions  Musculoskeletal: able to move all 4, 5/5  strength  Psychiatric: A&O, Cooperative, Appropriate  Neurologic: non focal  Labs on Admission:  Basic Metabolic Panel:  Recent Labs Lab 12/08/12 0201 12/08/12 0755 12/11/12 0550 12/11/12 1005  NA 135 140 139  --   K 4.3 3.6 3.7  --   CL 103 105 102  --   CO2 27 27 26   --   GLUCOSE 108* 112* 124*  --   BUN 8 7 8   --   CREATININE 0.67 0.66 0.71 0.75  CALCIUM 8.5 8.2* 8.6  --    Liver Function Tests:  Recent Labs Lab 12/11/12 0550  AST 14  ALT 14  ALKPHOS 107  BILITOT 0.1*  PROT 6.2  ALBUMIN 2.8*   CBC:  Recent Labs Lab 12/08/12 0755 12/11/12 0550 12/11/12 1005  WBC 10.5 10.8* 8.6  NEUTROABS  --  8.0*  --   HGB 11.9* 12.5* 12.7*  HCT 35.4* 37.0* 37.8*  MCV 93.9 94.1 94.3  PLT 221 228 219   Cardiac Enzymes:  Recent Labs Lab 12/08/12 0201 12/08/12 0800 12/11/12 0550  TROPONINI <0.30 0.35* <0.30    BNP (last 3 results)  Recent Labs  12/08/12 0201 12/11/12 0550  PROBNP 923.2* 965.2*    Radiological Exams on Admission: Dg Neck Soft Tissue  12/11/2012   *RADIOLOGY REPORT*  Clinical Data: Shortness of breath  NECK SOFT TISSUES - 1+ VIEW  Comparison: None.  Findings: Technically limited study due to overlying tubing.  As visualized, the epiglottis is not thickened.  There may be some thickening of the aryepiglottic folds which can be associated with croup.  Visualized airway appears patent on single view.  No submental soft tissue swelling.  Visualized cervical vertebra demonstrate normal alignment.  IMPRESSION: Suggestion of thickening of the aryepiglottic fold which may be associated with croup.   Original Report Authenticated By: Burman Nieves, M.D.   Dg Chest 2 View  12/11/2012   *RADIOLOGY REPORT*  Clinical Data: Shortness of breath and wheezing.  CHEST - 2 VIEW  Comparison: 12/08/2012  Findings: Normal heart size and pulmonary vascularity. Emphysematous changes and scattered fibrosis in the lungs.  No focal consolidation or airspace disease.  No  blunting of costophrenic angles.  Peribronchial thickening suggesting chronic bronchitis.  Healing right anterior rib fractures.  Degenerative changes in the spine.  IMPRESSION: Emphysema and chronic bronchitic changes.  No evidence of active pulmonary disease.   Original Report Authenticated By: Burman Nieves, M.D.    EKG: Independently reviewed. pending  Assessment/Plan Principal Problem:   Shortness of breath Active Problems:   HTN (hypertension)   Ischemic cardiomyopathy   COPD exacerbation   CHF, acute on chronic   Inspiratory stridor  SOB Likely COPD exacerbation with a component of mild CHF Treat with nebulizers and steroids as well as IS Oxygen PRN IV Lasix 40 mg daily Daily weights Is&Os Low salt diet  Inspiratory stridor Neck xray shows thickening of the aryepiglottic fold  ENT consulted, Evaluation by Dr. Pollyann Kennedy pending (Thank you)  Ischemic cardiomyopathy Cardiac arrest in May 2014 Troponin x 1 is WNL Echo in 11/2012 shows EF of 25 - 30 Will order Life Vest.  Will make cardiology aware he is in house.  HTN Well controlled. Continue previous home medications   Consultants: Pulmonology ENT (Dr. Pollyann Kennedy)  Code Status: full Family Communication: wife at bedside Disposition Plan:  inpatient  Time spent: 50 min  Conley Canal [facility] Pager [phone]  If 7PM-7AM, please contact night-coverage [email or web address] Password [facility] 12/11/2012, 12:07 PM

## 2012-12-11 NOTE — ED Provider Notes (Signed)
History    CSN: [id number] Arrival date & time 12/11/12  0450  First MD Initiated Contact with Patient 12/11/12 [phone]     Chief Complaint  Patient presents with  . Shortness of Breath   (Consider location/radiation/quality/duration/timing/severity/associated sxs/prior Treatment) HPI Comments: Patient with history of recent V-tach arrest for which he was on the arctic sun protocol.  He had a prolonged hospitalization that resulted in tracheostomy and prolonged vent requirement.  He was recently re-admitted for possible chf, fluid retention and discharged two days ago.  He returns continuing to feel short of breath, particularly when he lies flat.  No leg swelling, fever, chills, or cough.  Patient is a 57 y.o. male presenting with shortness of breath. The history is provided by the patient.  Shortness of Breath Severity:  Moderate Onset quality:  Gradual Duration:  2 days Timing:  Constant Progression:  Worsening Chronicity:  New Context: activity   Context comment:  Lying flat Relieved by:  Nothing Exacerbated by: position. Ineffective treatments:  None tried Associated symptoms: no chest pain, no cough and no fever    Past Medical History  Diagnosis Date  . Coronary artery disease     a. VT Arrest/Ant STEMI- Cath/PCI: DES to LAD (no other details of cath/pci in Epic).  . Ischemic cardiomyopathy     a. 11/2012 Echo: Ef 25-30%, septal apical & ant HK. No apical thrombus.  . Anoxic encephalopathy     a. s/p VT arrest 10/2012.  . Seizure disorder     a. in setting of encephalopathy post VT arrest 11/2012.  Marland Kitchen Hypertension   . Hyperlipidemia   . Dysphagia     a. 11/2012 s/p arrest with prolonged intubation and Trach.  . Tobacco abuse    Past Surgical History  Procedure Laterality Date  . Back surgery      ~20 years ago by Dr. Jeral Fruit  . Cardiac catheterization    . Coronary angioplasty with stent placement     Family History  Problem Relation Age of Onset  . Heart disease  Father    History  Substance Use Topics  . Smoking status: Former Smoker -- 1.00 packs/day for 30 years    Types: Cigarettes  . Smokeless tobacco: Not on file  . Alcohol Use: 6.0 oz/week    12 drink(s) per week     Comment: used to drink ~ 6 pack beer/weekend    Review of Systems  Constitutional: Negative for fever.  Respiratory: Positive for shortness of breath. Negative for cough.   Cardiovascular: Negative for chest pain.  All other systems reviewed and are negative.    Allergies  Review of patient's allergies indicates no known allergies.  Home Medications   Current Outpatient Rx  Name  Route  Sig  Dispense  Refill  . albuterol (PROVENTIL HFA;VENTOLIN HFA) 108 (90 BASE) MCG/ACT inhaler   Inhalation   Inhale 2 puffs into the lungs every 6 (six) hours as needed for wheezing.   1 Inhaler   2   . aspirin 81 MG chewable tablet   Oral   Chew 1 tablet (81 mg total) by mouth daily.         Marland Kitchen atorvastatin (LIPITOR) 80 MG tablet   Oral   Take 1 tablet (80 mg total) by mouth daily at 6 PM.   30 tablet   1   . carvedilol (COREG) 6.25 MG tablet   Oral   Take 1 tablet (6.25 mg total) by mouth 2 (two) times  daily with a meal.   60 tablet   1   . clopidogrel (PLAVIX) 75 MG tablet   Oral   Take 1 tablet (75 mg total) by mouth daily with breakfast.   30 tablet   1   . enalapril (VASOTEC) 2.5 MG tablet   Oral   Take 1 tablet (2.5 mg total) by mouth daily.   30 tablet   1   . furosemide (LASIX) 20 MG tablet   Oral   Take 1 tablet (20 mg total) by mouth daily as needed (for weight gain >2 lbs, swelling etc).   30 tablet   0   . phenytoin (DILANTIN) 200 MG ER capsule   Oral   Take 1 capsule (200 mg total) by mouth daily.   30 capsule   1   . phenytoin (DILANTIN) 300 MG ER capsule   Oral   Take 1 capsule (300 mg total) by mouth at bedtime.   30 capsule   1   . QUEtiapine (SEROQUEL) 50 MG tablet   Oral   Take 3 tablets (150 mg total) by mouth at  bedtime.   30 tablet   1   . spironolactone (ALDACTONE) 12.5 mg TABS   Oral   Take 1 tablet (25 mg total) by mouth daily.   30 tablet   1   . valproic acid (DEPAKENE) 250 MG capsule   Oral   Take 3 capsules (750 mg total) by mouth 2 (two) times daily.   90 capsule   1    BP 100/75  Pulse 75  Temp(Src) 98.2 F (36.8 C) (Oral)  Resp 18  SpO2 100% Physical Exam  Nursing note and vitals reviewed. Constitutional: He is oriented to person, place, and time. He appears well-developed and well-nourished. No distress.  HENT:  Head: Normocephalic and atraumatic.  Mouth/Throat: Oropharynx is clear and moist.  Neck: Normal range of motion. Neck supple.  Cardiovascular: Normal rate, regular rhythm and normal heart sounds.   No murmur heard. Pulmonary/Chest: No respiratory distress.  There is inspiratory stridor noted.  Lungs otherwise sound clear with the exception of transmitted upper airway sounds.   Abdominal: Soft. Bowel sounds are normal. He exhibits no distension. There is no tenderness.  Musculoskeletal: Normal range of motion. He exhibits no edema.  Lymphadenopathy:    He has no cervical adenopathy.  Neurological: He is alert and oriented to person, place, and time.  Skin: Skin is warm and dry. He is not diaphoretic.    ED Course  Procedures (including critical care time) Labs Reviewed  CBC WITH DIFFERENTIAL  COMPREHENSIVE METABOLIC PANEL  PRO B NATRIURETIC PEPTIDE  TROPONIN I   No results found. No diagnosis found.   Date: 12/11/2012  Rate: 74  Rhythm: normal sinus rhythm  QRS Axis: normal  Intervals: normal  ST/T Wave abnormalities: nonspecific T wave changes  Conduction Disutrbances:none  Narrative Interpretation:   Old EKG Reviewed: changes noted    MDM  The patient presents with dyspnea that appears to be more stridor related than heart failure.  I have spoken with [facility] who agrees to admit, and also with Dr. Sung Amabile from critical care who will  consult.  Geoffery Lyons, MD 12/11/12 [phone]

## 2012-12-11 NOTE — Consult Note (Addendum)
Cardiology Consult Note   Patient ID: MALIEK SCHELLHORN MRN: [id number], DOB/AGE: Jun 13, 1955   Admit date: 12/11/2012 Date of Consult: 12/11/2012  Primary Physician: No PCP Per Patient Primary Cardiologist: Lovina Reach, MD  Reason for consult: pre-op evaluation for upper airway endoscopy +/- operative repair of subglottic obstructive lesion  HPI: AMATO SEVILLANO is a 57 y.o. male with recent h/o VT arrest in the setting of anterior STEMI 10/30/2012 s/p DES-LAD and prolonged hospitalization requiring rehab stay related to anoxic encephalopathy and deconditioning with recurrent re-admissions for dyspnea and stridor.   He developed cardiogenic shock post-PCI requiring IABP and pressor support. EF noted to be 25-30% at that time. He underwent [employer] [employer] protocol. He suffered anoxic encephalopathy requiring prolonged intubation and eventually received a trach due to delayed neurologic recovery. The patient did have an incident where he self-extubated. He exhibited seizure activity as well and was placed on anticonvulsants. He was eventually transferred to inpatient rehab 6/24 and discharged 7/3. He represented to [facility] ED 7/7 after experiencing increased DOE, orthopnea and nonproductive cough in the setting of dietary indiscretion. He was on spironolactone, but not Lasix as an outpatient. BNP was mildly elevated at 932. Weight 70.4kg (up from 69.6kg on 7/2). CXR w/o evidence of frank pulmonary edema. He was noted to be fairly euvolemic on exam; however, was diuresed with Lasix 20mg  IV. He was discharged shortly after with PRN Lasix for weight gain and planned follow-up in the CHF clinic. Cleda Daub was up-titrated.   He has been re-admitted today for a similar scenario. Breathing had improved with Lasix and inhalers. He had been feeling well since recent discharge. He awoke suddenly at 2 AM with acute dyspnea and persistent inspiratory stridor. He notes baseline unchanged orthopnea. He denies chest pain,  LE edema, active bleeding, fevers or chills. He thus presented to the ED.  There, initial trop-I WNL. ProBNP mildly elevated at 965.2. Weight actually lower at 68.8kg. CXR w/ chronic emphysematous and bronchitic changes, no acute abnormalities. CBC- 10.8K, Hgb 12.5, Hct 37. Pulmonology was consulted who suspected an acute COPD exacerbation, but also question of obstructive upper airway/tracheal stenosis accounting for dyspnea and inspiratory stridor. CHF was suspected to be mildly contributory. ENT was subsequently consulted and plain radiographs of the neck were obtained which revealed aryepiglottic fold which may be associated with croup. Laryngoscopy was performed revealing subglottic airway restriction favored to represent a subglottic granuloma or mature fibrosis/stenosis.  The decision has been made to proceed with upper airway endoscopy +/- operative repair pending cardiology pre-op clearance.   Problem List: Past Medical History  Diagnosis Date  . Coronary artery disease     a. VT Arrest/Ant STEMI- Cath/PCI: DES to LAD (no other details of cath/pci in Epic).  . Ischemic cardiomyopathy     a. 11/2012 Echo: Ef 25-30%, septal apical & ant HK. No apical thrombus.  . Anoxic encephalopathy     a. s/p VT arrest 10/2012.  . Seizure disorder     a. in setting of encephalopathy post VT arrest 11/2012.  Marland Kitchen Hypertension   . Hyperlipidemia   . Dysphagia     a. 11/2012 s/p arrest with prolonged intubation and Trach.  . Tobacco abuse     Past Surgical History  Procedure Laterality Date  . Back surgery      ~20 years ago by Dr. Jeral Fruit  . Cardiac catheterization    . Coronary angioplasty with stent placement       Allergies: No Known Allergies  Home Medications: Prior to Admission medications   Medication Sig Start Date End Date Taking? Authorizing Provider  albuterol (PROVENTIL HFA;VENTOLIN HFA) 108 (90 BASE) MCG/ACT inhaler Inhale 2 puffs into the lungs every 6 (six) hours as needed for  wheezing. 12/08/12  Yes Zannie Cove, MD  aspirin 81 MG chewable tablet Chew 1 tablet (81 mg total) by mouth daily. 12/04/12  Yes Daniel J Angiulli, PA-C  atorvastatin (LIPITOR) 80 MG tablet Take 1 tablet (80 mg total) by mouth daily at 6 PM. 12/04/12  Yes Mcarthur Rossetti Angiulli, PA-C  carvedilol (COREG) 6.25 MG tablet Take 1 tablet (6.25 mg total) by mouth 2 (two) times daily with a meal. 12/04/12  Yes Mcarthur Rossetti Angiulli, PA-C  clopidogrel (PLAVIX) 75 MG tablet Take 1 tablet (75 mg total) by mouth daily with breakfast. 12/04/12  Yes Mcarthur Rossetti Angiulli, PA-C  enalapril (VASOTEC) 2.5 MG tablet Take 1 tablet (2.5 mg total) by mouth daily. 12/04/12  Yes Daniel J Angiulli, PA-C  furosemide (LASIX) 20 MG tablet Take 1 tablet (20 mg total) by mouth daily as needed (for weight gain >2 lbs, swelling etc). 12/08/12  Yes Zannie Cove, MD  phenytoin (DILANTIN) 200 MG ER capsule Take 1 capsule (200 mg total) by mouth daily. 12/04/12  Yes Daniel J Angiulli, PA-C  phenytoin (DILANTIN) 300 MG ER capsule Take 1 capsule (300 mg total) by mouth at bedtime. 12/04/12  Yes Daniel J Angiulli, PA-C  QUEtiapine (SEROQUEL) 50 MG tablet Take 3 tablets (150 mg total) by mouth at bedtime. 12/04/12  Yes Daniel J Angiulli, PA-C  spironolactone (ALDACTONE) 12.5 mg TABS Take 1 tablet (25 mg total) by mouth daily. 12/08/12  Yes Zannie Cove, MD  valproic acid (DEPAKENE) 250 MG capsule Take 3 capsules (750 mg total) by mouth 2 (two) times daily. 12/04/12  Yes Mcarthur Rossetti Angiulli, PA-C    Inpatient Medications:  . aspirin  81 mg Oral Daily  . atorvastatin  80 mg Oral q1800  . carvedilol  6.25 mg Oral BID WC  . clopidogrel  75 mg Oral Q breakfast  . docusate sodium  100 mg Oral BID  . enalapril  2.5 mg Oral Daily  . enoxaparin (LOVENOX) injection  40 mg Subcutaneous Q24H  . furosemide  40 mg Intravenous Daily  . guaiFENesin  1,200 mg Oral BID  . levalbuterol  0.63 mg Nebulization Q6H  . lidocaine (PF)      . lidocaine  15 mL Mouth/Throat Once  .  methylPREDNISolone (SOLU-MEDROL) injection  40 mg Intravenous Q8H  . phenytoin  200 mg Oral Daily  . phenytoin  300 mg Oral QHS  . QUEtiapine  150 mg Oral QHS  . sodium chloride  3 mL Intravenous Q12H  . sodium chloride  3 mL Intravenous Q12H  . valproic acid  750 mg Oral BID   Prescriptions prior to admission  Medication Sig Dispense Refill  . albuterol (PROVENTIL HFA;VENTOLIN HFA) 108 (90 BASE) MCG/ACT inhaler Inhale 2 puffs into the lungs every 6 (six) hours as needed for wheezing.  1 Inhaler  2  . aspirin 81 MG chewable tablet Chew 1 tablet (81 mg total) by mouth daily.      Marland Kitchen atorvastatin (LIPITOR) 80 MG tablet Take 1 tablet (80 mg total) by mouth daily at 6 PM.  30 tablet  1  . carvedilol (COREG) 6.25 MG tablet Take 1 tablet (6.25 mg total) by mouth 2 (two) times daily with a meal.  60 tablet  1  . clopidogrel (PLAVIX)  75 MG tablet Take 1 tablet (75 mg total) by mouth daily with breakfast.  30 tablet  1  . enalapril (VASOTEC) 2.5 MG tablet Take 1 tablet (2.5 mg total) by mouth daily.  30 tablet  1  . furosemide (LASIX) 20 MG tablet Take 1 tablet (20 mg total) by mouth daily as needed (for weight gain >2 lbs, swelling etc).  30 tablet  0  . phenytoin (DILANTIN) 200 MG ER capsule Take 1 capsule (200 mg total) by mouth daily.  30 capsule  1  . phenytoin (DILANTIN) 300 MG ER capsule Take 1 capsule (300 mg total) by mouth at bedtime.  30 capsule  1  . QUEtiapine (SEROQUEL) 50 MG tablet Take 3 tablets (150 mg total) by mouth at bedtime.  30 tablet  1  . spironolactone (ALDACTONE) 12.5 mg TABS Take 1 tablet (25 mg total) by mouth daily.  30 tablet  1  . valproic acid (DEPAKENE) 250 MG capsule Take 3 capsules (750 mg total) by mouth 2 (two) times daily.  90 capsule  1    Family History  Problem Relation Age of Onset  . Heart disease Father      History   Social History  . Marital Status: Married    Spouse Name: N/A    Number of Children: N/A  . Years of Education: N/A    Occupational History  . Not on file.   Social History Main Topics  . Smoking status: Former Smoker -- 1.00 packs/day for 30 years    Types: Cigarettes  . Smokeless tobacco: Not on file  . Alcohol Use: 6.0 oz/week    12 drink(s) per week     Comment: used to drink ~ 6 pack beer/weekend  . Drug Use: No  . Sexually Active: Not on file   Other Topics Concern  . Not on file   Social History Narrative   Lives in [place] with his wife.  Previously a Writer in Geologist, engineering.  Most recently worked in Environmental manager.     Review of Systems: General: negative for chills, fever, night sweats or weight changes.  Cardiovascular: positive for DOE, shortness of breath, orthopnea, negative for chest pain, edema, orthopnea, palpitations, paroxysmal nocturnal dyspnea Dermatological: negative for rash Respiratory: positive for cough, wheezing and stridor Urologic: negative for hematuria Abdominal: negative for nausea, vomiting, diarrhea, bright red blood per rectum, melena, or hematemesis Neurologic: negative for visual changes, syncope, or dizziness All other systems reviewed and are otherwise negative except as noted above.  Physical Exam: Blood pressure 96/58, pulse 77, temperature 98 F (36.7 C), temperature source Oral, resp. rate 18, height 5\' 11"  (1.803 m), weight 68.8 kg (151 lb 10.8 oz), SpO2 98.00%.    General: Well developed, well nourished, in no acute distress. Head: Normocephalic, atraumatic, sclera non-icteric, no xanthomas, nares are without discharge. Neck: Negative for carotid bruits. JVD not elevated. Lungs: Distant breath sounds, upper respiratory stridor, trace wheezing. No appreciable rales or rhonchi.  Heart: RRR with S1 S2. No murmurs, rubs, or gallops appreciated. Abdomen: Soft, non-tender, non-distended with normoactive bowel sounds. No hepatomegaly. No rebound/guarding. No obvious abdominal masses. Msk:  Strength and tone  appears normal for age. Extremities: No clubbing, cyanosis or edema.  Distal pedal pulses are 2+ and equal bilaterally. Neuro: Alert and oriented X 3. Moves all extremities spontaneously. Psych:  Responds to questions appropriately with a normal affect.  Labs: Recent Labs     12/11/12  0550  12/11/12  1005  WBC  10.8*  8.6  HGB  12.5*  12.7*  HCT  37.0*  37.8*  MCV  94.1  94.3  PLT  228  219    Recent Labs Lab 12/08/12 0201 12/08/12 0755 12/11/12 0550 12/11/12 1005  NA 135 140 139  --   K 4.3 3.6 3.7  --   CL 103 105 102  --   CO2 27 27 26   --   BUN 8 7 8   --   CREATININE 0.67 0.66 0.71 0.75  CALCIUM 8.5 8.2* 8.6  --   PROT  --   --  6.2  --   BILITOT  --   --  0.1*  --   ALKPHOS  --   --  107  --   ALT  --   --  14  --   AST  --   --  14  --   GLUCOSE 108* 112* 124*  --    Recent Labs     12/11/12  0550  TROPONINI  <0.30   Radiology/Studies:   Dg Neck Soft Tissue  12/11/2012   *RADIOLOGY REPORT*  Clinical Data: Shortness of breath  NECK SOFT TISSUES - 1+ VIEW  Comparison: None.  Findings: Technically limited study due to overlying tubing.  As visualized, the epiglottis is not thickened.  There may be some thickening of the aryepiglottic folds which can be associated with croup.  Visualized airway appears patent on single view.  No submental soft tissue swelling.  Visualized cervical vertebra demonstrate normal alignment.  IMPRESSION: Suggestion of thickening of the aryepiglottic fold which may be associated with croup.   Original Report Authenticated By: Burman Nieves, M.D.   Dg Chest 2 View  12/11/2012   *RADIOLOGY REPORT*  Clinical Data: Shortness of breath and wheezing.  CHEST - 2 VIEW  Comparison: 12/08/2012  Findings: Normal heart size and pulmonary vascularity. Emphysematous changes and scattered fibrosis in the lungs.  No focal consolidation or airspace disease.  No blunting of costophrenic angles.  Peribronchial thickening suggesting chronic bronchitis.   Healing right anterior rib fractures.  Degenerative changes in the spine.  IMPRESSION: Emphysema and chronic bronchitic changes.  No evidence of active pulmonary disease.   Original Report Authenticated By: Burman Nieves, M.D.   EKG: pending  ASSESSMENT AND PLAN:   57 y.o. male with recent h/o VT arrest in the setting of anterior STEMI 10/2012 s/p DES-LAD and prolonged hospitalization requiring rehab stay related to anoxic encephalopathy and deconditioning with recurrent re-admissions for dyspnea and stridor.  1. Dyspnea and stridor  A. Recent prolonged intubation requiring trach placement   B. Subsequent anoxic encephalopathy  C. Episode of self-extubation  D. Recurrent admissions for dyspnea and stridor since 7/2 discharge 2. Subglottic thickening with functional restriction  A. By radiograph and laryngoscopy, respectively  B. Suspected to represent subglottic granuloma or mature fibrosis/stenosis  C. Large contributing factor to the patient's recent respiratory difficulties  D. Plan per ENT for operative endoscopy 3. CAD   A. Anterior STEMI s/p DES-LAD 10/30/2012 (cath note not available in Epic)  B. Complicated by cardiogenic shock requiring pressor, IABP support 4. Ischemic cardiomyopathy/chronic systolic CHF  A. EF 25-30%  B. Felt not to be a candidate for LifeVest d/t concern for inappropriate shocks  C. Follow-up echo in ~ 2 months to reassess EF with optimal medical therapy 5. COPD  A. Prior tobacco abuse; 40 pack-year history 6. Hypertension 7. Hyperlipidemia  The patient's cardiac status is stable. He  does not appear volume overloaded. He does endorse chronic DOE, orthopnea and nonproductive cough. These symptoms may overlap with his recently diagnosed upper respiratory obstruction +/- underlying COPD/CHF. He denies chest pain. He is improving functionally and able to complete just above 4 METs. Main concerns are proximity to recent anterior STEMI and antiplatelet therapy- he  underwent PCI/DES ~ 6 weeks ago and requires uninterrupted dual antiplatelet therapy x 12 months. Per discussion with Dr. Pollyann Kennedy, ENT would prefer proceeding with operative endoscopy over medical management. This would be at high risk given recent STEMI with associated complications and bleeding risk with concomitant ASA/Plavix. Will discuss with interventionalists alternative options for antiplatelet therapy in the peri-operative period in this situation. Additionally, the patient likely has poor baseline pulmonary reserve due to underlying COPD, chronic systolic CHF and recent anoxic encephalopathy requiring prolonged intubation and trach placement. There would be pulmonary risk with the use of general anesthesia as well.    Signed, R. Hurman Horn, PA-C 12/11/2012, 2:44 PM  As above, patient seen and examined. Briefly he is a 57 year old male for preoperative evaluation prior to upper airway endoscopy plus minus operative repair. Patient was admitted on May 29 following an anterior infarct complicated by ventricular tachycardia arrest. He had a drug-eluting stent to his LAD. He suffered anoxic encephalopathy and also required a tracheostomy. His echocardiogram showed an ejection fraction of 25-30%. The patient slowly recovered and was ultimately discharged to rehabilitation. He was seen recently for increased dyspnea and diuresed. However he continues to complain of dyspnea. He notices this at night with lying down for extended amounts of time. He has noticed stridor as well. He denies dyspnea on exertion, pedal edema, chest pain or syncope. ENT has evaluated the patient and feels he needs the above procedure. There is question of subglottic granuloma or fibrosis/stenosis.  Given recent drug-eluting stent to his LAD the patient should not have aspirin or Plavix interrupted at this point. Ideally this should be continued for one year but Plavix could potentially be discontinued in 6 months if necessary for  surgery (and ASA continued). We are only 6 weeks from his recent infarct. The procedure will require general anesthesia. If his condition is felt to be an emergency then the patient can proceed realizing that his risk is high. If the procedure is not an emergency and can be delayed for at least 3 months following his infarct that would be ideal. I discussed the case with Dr. Pollyann Kennedy. He felt he most likely can be managed medically at this point. Alternatively he could have a tracheostomy under local anesthesia with further surgery in the future once he is further away from his infarct. Continue present cardiac meds. We will follow Olga Millers

## 2012-12-11 NOTE — Consult Note (Signed)
Reason for Consult: Inspiratory stridor Referring Physician: Clydia Llano, MD  Walter Frazier is an 57 y.o. male.  HPI: History of acute MI, with associated cardiac arrest, prolonged intubation, tracheostomy, all within the past month or so. He was readmitted now for difficulty breathing and stridor. He has responded well to steroid therapy. He denies any voice or swallowing difficulty. He has quit smoking since his acute MI  Past Medical History  Diagnosis Date  . Coronary artery disease     a. VT Arrest/Ant STEMI- Cath/PCI: DES to LAD (no other details of cath/pci in Epic).  . Ischemic cardiomyopathy     a. 11/2012 Echo: Ef 25-30%, septal apical & ant HK. No apical thrombus.  . Anoxic encephalopathy     a. s/p VT arrest 10/2012.  . Seizure disorder     a. in setting of encephalopathy post VT arrest 11/2012.  Marland Kitchen Hypertension   . Hyperlipidemia   . Dysphagia     a. 11/2012 s/p arrest with prolonged intubation and Trach.  . Tobacco abuse     Past Surgical History  Procedure Laterality Date  . Back surgery      ~20 years ago by Dr. Jeral Fruit  . Cardiac catheterization    . Coronary angioplasty with stent placement      Family History  Problem Relation Age of Onset  . Heart disease Father     Social History:  reports that he has quit smoking. His smoking use included Cigarettes. He has a 30 pack-year smoking history. He does not have any smokeless tobacco history on file. He reports that he drinks about 6.0 ounces of alcohol per week. He reports that he does not use illicit drugs.  Allergies: No Known Allergies  Medications: Reviewed  Results for orders placed during the hospital encounter of 12/11/12 (from the past 48 hour(s))  CBC WITH DIFFERENTIAL     Status: Abnormal   Collection Time    12/11/12  5:50 AM      Result Value Range   WBC 10.8 (*) 4.0 - 10.5 K/uL   RBC 3.93 (*) 4.22 - 5.81 MIL/uL   Hemoglobin 12.5 (*) 13.0 - 17.0 g/dL   HCT 40.9 (*) 81.1 - 91.4 %   MCV 94.1   78.0 - 100.0 fL   MCH 31.8  26.0 - 34.0 pg   MCHC 33.8  30.0 - 36.0 g/dL   RDW 78.2  95.6 - 21.3 %   Platelets 228  150 - 400 K/uL   Neutrophils Relative % 74  43 - 77 %   Neutro Abs 8.0 (*) 1.7 - 7.7 K/uL   Lymphocytes Relative 16  12 - 46 %   Lymphs Abs 1.7  0.7 - 4.0 K/uL   Monocytes Relative 9  3 - 12 %   Monocytes Absolute 1.0  0.1 - 1.0 K/uL   Eosinophils Relative 1  0 - 5 %   Eosinophils Absolute 0.1  0.0 - 0.7 K/uL   Basophils Relative 1  0 - 1 %   Basophils Absolute 0.1  0.0 - 0.1 K/uL  COMPREHENSIVE METABOLIC PANEL     Status: Abnormal   Collection Time    12/11/12  5:50 AM      Result Value Range   Sodium 139  135 - 145 mEq/L   Potassium 3.7  3.5 - 5.1 mEq/L   Chloride 102  96 - 112 mEq/L   CO2 26  19 - 32 mEq/L   Glucose, Bld 124 (*)  70 - 99 mg/dL   BUN 8  6 - 23 mg/dL   Creatinine, Ser 0.86  0.50 - 1.35 mg/dL   Calcium 8.6  8.4 - 57.8 mg/dL   Total Protein 6.2  6.0 - 8.3 g/dL   Albumin 2.8 (*) 3.5 - 5.2 g/dL   AST 14  0 - 37 U/L   ALT 14  0 - 53 U/L   Alkaline Phosphatase 107  39 - 117 U/L   Total Bilirubin 0.1 (*) 0.3 - 1.2 mg/dL   GFR calc non Af Amer >90  >90 mL/min   GFR calc Af Amer >90  >90 mL/min   Comment:            The eGFR has been calculated     using the CKD EPI equation.     This calculation has not been     validated in all clinical     situations.     eGFR's persistently     <90 mL/min signify     possible Chronic Kidney Disease.  PRO B NATRIURETIC PEPTIDE     Status: Abnormal   Collection Time    12/11/12  5:50 AM      Result Value Range   Pro B Natriuretic peptide (BNP) 965.2 (*) 0 - 125 pg/mL  TROPONIN I     Status: None   Collection Time    12/11/12  5:50 AM      Result Value Range   Troponin I <0.30  <0.30 ng/mL   Comment:            Due to the release kinetics of cTnI,     a negative result within the first hours     of the onset of symptoms does not rule out     myocardial infarction with certainty.     If myocardial  infarction is still suspected,     repeat the test at appropriate intervals.  CBC     Status: Abnormal   Collection Time    12/11/12 10:05 AM      Result Value Range   WBC 8.6  4.0 - 10.5 K/uL   RBC 4.01 (*) 4.22 - 5.81 MIL/uL   Hemoglobin 12.7 (*) 13.0 - 17.0 g/dL   HCT 46.9 (*) 62.9 - 52.8 %   MCV 94.3  78.0 - 100.0 fL   MCH 31.7  26.0 - 34.0 pg   MCHC 33.6  30.0 - 36.0 g/dL   RDW 41.3  24.4 - 01.0 %   Platelets 219  150 - 400 K/uL  CREATININE, SERUM     Status: None   Collection Time    12/11/12 10:05 AM      Result Value Range   Creatinine, Ser 0.75  0.50 - 1.35 mg/dL   GFR calc non Af Amer >90  >90 mL/min   GFR calc Af Amer >90  >90 mL/min   Comment:            The eGFR has been calculated     using the CKD EPI equation.     This calculation has not been     validated in all clinical     situations.     eGFR's persistently     <90 mL/min signify     possible Chronic Kidney Disease.    Dg Neck Soft Tissue  12/11/2012   *RADIOLOGY REPORT*  Clinical Data: Shortness of breath  NECK SOFT TISSUES - 1+ VIEW  Comparison: None.  Findings: Technically limited study due to overlying tubing.  As visualized, the epiglottis is not thickened.  There may be some thickening of the aryepiglottic folds which can be associated with croup.  Visualized airway appears patent on single view.  No submental soft tissue swelling.  Visualized cervical vertebra demonstrate normal alignment.  IMPRESSION: Suggestion of thickening of the aryepiglottic fold which may be associated with croup.   Original Report Authenticated By: Burman Nieves, M.D.   Dg Chest 2 View  12/11/2012   *RADIOLOGY REPORT*  Clinical Data: Shortness of breath and wheezing.  CHEST - 2 VIEW  Comparison: 12/08/2012  Findings: Normal heart size and pulmonary vascularity. Emphysematous changes and scattered fibrosis in the lungs.  No focal consolidation or airspace disease.  No blunting of costophrenic angles.  Peribronchial thickening  suggesting chronic bronchitis.  Healing right anterior rib fractures.  Degenerative changes in the spine.  IMPRESSION: Emphysema and chronic bronchitic changes.  No evidence of active pulmonary disease.   Original Report Authenticated By: Burman Nieves, M.D.    ZOX:[id number] except as listed in admit H&P  Blood pressure 96/58, pulse 77, temperature 98 F (36.7 C), temperature source Oral, resp. rate 18, height 5\' 11"  (1.803 m), weight 151 lb 10.8 oz (68.8 kg), SpO2 98.00%.  PHYSICAL EXAM: Overall appearance:  Healthy appearing, in no distress. He has mild inspiratory stridor Head:  Normocephalic, atraumatic. Ears: External ears normal. Nose: External nose is healthy in appearance. Internal nasal exam free of any lesions or obstruction. Oral Cavity:  There are no mucosal lesions or masses identified. Oral Pharynx/Hypopharynx/Larynx: no signs of any mucosal lesions or masses identified.  Neuro:  No identifiable neurologic deficits. Neck: No palpable neck masses.  Studies Reviewed: none  Procedures: Flexible fiberoptic laryngoscopy performed after topical lidocaine viscous application in the nasal cavities. Nasopharynx, hypopharynx, larynx without any obstruction. No mucosal lesions identified. There is mild edema of the vocal cord mucosa and arytenoid mucosa. The cords appear to move well. There does appear to be some restriction in the subglottic airway.   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 Will need operative endoscopy, possible endoscopic treatment, possible tracheostomy. I will need to consult with his cardiologist first. At the moment, he's in no immediate danger.  Walter Frazier 12/11/2012, 1:03 PM

## 2012-12-11 NOTE — Progress Notes (Signed)
Was approached by RN in room to listen to pt. She stated she wasn't sure if BBS were stridor or wheezes. After listening to pt, audible expiratory wheezes as well as wheezing throughout all lung fields. Also listened to lateral neck and no stridor was noted.

## 2012-12-12 DIAGNOSIS — I2589 Other forms of chronic ischemic heart disease: Secondary | ICD-10-CM

## 2012-12-12 LAB — BASIC METABOLIC PANEL
BUN: 11 mg/dL (ref 6–23)
Calcium: 8.4 mg/dL (ref 8.4–10.5)
GFR calc Af Amer: >90 mL/min (ref >90)
GFR calc non Af Amer: >90 mL/min (ref >90)
Glucose, Bld: 105 mg/dL — ABNORMAL HIGH (ref 70–99)
Potassium: 4.5 mEq/L (ref 3.5–5.1)
Sodium: 138 mEq/L (ref 135–145)

## 2012-12-12 LAB — CBC
MCH: 31.6 pg (ref 26.0–34.0)
MCHC: 33.7 g/dL (ref 30.0–36.0)
Platelets: 219 10*3/uL (ref 150–400)

## 2012-12-12 MED ORDER — LEVALBUTEROL HCL 0.63 MG/3ML IN NEBU
0.6300 mg | INHALATION_SOLUTION | Freq: Four times a day (QID) | RESPIRATORY_TRACT | Status: DC
  Administered 2012-12-13 (×2): 0.63 mg via RESPIRATORY_TRACT
  Filled 2012-12-12 (×6): qty 3

## 2012-12-12 MED ORDER — LEVALBUTEROL HCL 0.63 MG/3ML IN NEBU: 0.6300 mg | INHALATION_SOLUTION | Freq: Four times a day (QID) | RESPIRATORY_TRACT | Status: DC | PRN

## 2012-12-12 NOTE — Progress Notes (Addendum)
[facility] PROGRESS NOTE  YAEL ANGERER ZOX:[id number] DOB: [birth year] DOA: 12/11/2012 PCP: Vonita Moss, MD  Assessment/Plan: COPD exacerbation with a component of mild CHF  -Continue nebulizers and steroids and supplemental Oxygen -continue IV Lasix 40 mg daily   Inspiratory stridor  -Neck xray showed thickening of the aryepiglottic fold  -Per ENT/ Dr. Pollyann Kennedy subglottic pathology either granuloma or mature fibrosis/stenosis Will need operative endoscopy, possible endoscopic treatment, possible tracheostomy pending cards clearance -pt seen by cards>> high risk- please see Dr Ludwig Clarks full consult note>> recommend to hold off for 3mos unless emergent - await ENT follow up for further recs   Ischemic cardiomyopathy  Cardiac arrest in May 2014  Troponin x 1 is WNL  Echo in 11/2012 shows EF of 25 - 30  -Per cards felt not to be a candidate for [facility] 2/2 concern for inappropriate shocks -Follow-up echo in ~ 2 months recommended to reassess EF with optimal medical therapy- continue outpt meds  HTN  Well controlled.  Continue previous home medications  H/O SZS -continue outpt medss  Code Status: full Family Communication: wife at bedside Disposition Plan: to home when medically stable   Consultants:  Ccm  Cards  ENT  Procedures:  none  Antibiotics:  none  HPI/Subjective: Pt sitting up in cahir, states breathing much better today and ambulating w/o difficulty  Objective: Filed Vitals:   12/11/12 2119 12/12/12 0135 12/12/12 0512 12/12/12 0804  BP:  92/63 99/70   Pulse:  59 65   Temp:  98.4 F (36.9 C) 97.9 F (36.6 C)   TempSrc:  Oral Oral   Resp:  18 17   Height:      Weight:   68.221 kg (150 lb 6.4 oz)   SpO2: 100% 100% 100% 98%    Intake/Output Summary (Last 24 hours) at 12/12/12 0919 Last data filed at 12/12/12 0848  Gross per 24 hour  Intake    843 ml  Output   2225 ml  Net  -1382 ml   Filed Weights   12/11/12 0900 12/12/12  0512  Weight: 68.8 kg (151 lb 10.8 oz) 68.221 kg (150 lb 6.4 oz)    Exam: Exam:  General: alert & oriented x  In NAD Cardiovascular: RRR, nl S1 s2 Respiratory: CTAB, no stridor Abdomen: soft +BS NT/ND, no masses palpable Extremities: No cyanosis and no edema     Data Reviewed: Basic Metabolic Panel:  Recent Labs Lab 12/08/12 0201 12/08/12 0755 12/11/12 0550 12/11/12 1005 12/12/12 0450  NA 135 140 139  --  138  K 4.3 3.6 3.7  --  4.5  CL 103 105 102  --  101  CO2 27 27 26   --  31  GLUCOSE 108* 112* 124*  --  105*  BUN 8 7 8   --  11  CREATININE 0.67 0.66 0.71 0.75 0.73  CALCIUM 8.5 8.2* 8.6  --  8.4   Liver Function Tests:  Recent Labs Lab 12/11/12 0550  AST 14  ALT 14  ALKPHOS 107  BILITOT 0.1*  PROT 6.2  ALBUMIN 2.8*   No results found for this basename: LIPASE, AMYLASE,  in the last 168 hours No results found for this basename: AMMONIA,  in the last 168 hours CBC:  Recent Labs Lab 12/08/12 0755 12/11/12 0550 12/11/12 1005 12/12/12 0450  WBC 10.5 10.8* 8.6 10.2  NEUTROABS  --  8.0*  --   --   HGB 11.9* 12.5* 12.7* 12.6*  HCT 35.4* 37.0* 37.8* 37.4*  MCV  93.9 94.1 94.3 93.7  PLT 221 228 219 219   Cardiac Enzymes:  Recent Labs Lab 12/08/12 0201 12/08/12 0800 12/11/12 0550  TROPONINI <0.30 0.35* <0.30   BNP (last 3 results)  Recent Labs  12/08/12 0201 12/11/12 0550  PROBNP 923.2* 965.2*   CBG: No results found for this basename: GLUCAP,  in the last 168 hours  No results found for this or any previous visit (from the past 240 hour(s)).   Studies: Dg Neck Soft Tissue  12/11/2012   *RADIOLOGY REPORT*  Clinical Data: Shortness of breath  NECK SOFT TISSUES - 1+ VIEW  Comparison: None.  Findings: Technically limited study due to overlying tubing.  As visualized, the epiglottis is not thickened.  There may be some thickening of the aryepiglottic folds which can be associated with croup.  Visualized airway appears patent on single view.  No  submental soft tissue swelling.  Visualized cervical vertebra demonstrate normal alignment.  IMPRESSION: Suggestion of thickening of the aryepiglottic fold which may be associated with croup.   Original Report Authenticated By: Burman Nieves, M.D.   Dg Chest 2 View  12/11/2012   *RADIOLOGY REPORT*  Clinical Data: Shortness of breath and wheezing.  CHEST - 2 VIEW  Comparison: 12/08/2012  Findings: Normal heart size and pulmonary vascularity. Emphysematous changes and scattered fibrosis in the lungs.  No focal consolidation or airspace disease.  No blunting of costophrenic angles.  Peribronchial thickening suggesting chronic bronchitis.  Healing right anterior rib fractures.  Degenerative changes in the spine.  IMPRESSION: Emphysema and chronic bronchitic changes.  No evidence of active pulmonary disease.   Original Report Authenticated By: Burman Nieves, M.D.    Scheduled Meds: . aspirin  81 mg Oral Daily  . atorvastatin  80 mg Oral q1800  . carvedilol  6.25 mg Oral BID WC  . clopidogrel  75 mg Oral Q breakfast  . docusate sodium  100 mg Oral BID  . enalapril  2.5 mg Oral Daily  . enoxaparin (LOVENOX) injection  40 mg Subcutaneous Q24H  . furosemide  40 mg Intravenous Daily  . guaiFENesin  1,200 mg Oral BID  . levalbuterol  0.63 mg Nebulization Q6H  . lidocaine  15 mL Mouth/Throat Once  . methylPREDNISolone (SOLU-MEDROL) injection  40 mg Intravenous Q8H  . phenytoin  200 mg Oral Daily  . phenytoin  300 mg Oral QHS  . pneumococcal 23 valent vaccine  0.5 mL Intramuscular Tomorrow-1000  . QUEtiapine  150 mg Oral QHS  . sodium chloride  3 mL Intravenous Q12H  . sodium chloride  3 mL Intravenous Q12H  . valproic acid  750 mg Oral BID   Continuous Infusions:   Principal Problem:   Shortness of breath Active Problems:   HTN (hypertension)   Ischemic cardiomyopathy   COPD exacerbation   CHF, acute on chronic   Inspiratory stridor   Preop cardiovascular exam    Time spent:  25    [facility]  [facility] Pager [phone]. If 7PM-7AM, please contact night-coverage at [email or web address], password [facility] 12/12/2012, 9:19 AM  LOS: 1 day

## 2012-12-12 NOTE — Progress Notes (Signed)
Addendum to Consultation:  Upon review and discussion with Cardiology, this situation is very unusual. Any surgical intervention is complicated by the fact that stopping Plavix, and general anesthesia are both contraindicated and potentially dangerous.   I would recommend that if he gets worse and airway surgical Intervention is needed, that we [facility] to a [facility] setting for further care.

## 2012-12-12 NOTE — Care Management Note (Signed)
    Page 1 of 1   12/12/2012     4:09:25 PM   CARE MANAGEMENT NOTE 12/12/2012  Patient:  Walter Frazier, Walter Frazier   Account Number:  [id number]  Date Initiated:  12/12/2012  Documentation initiated by:  Tera Mater  Subjective/Objective Assessment:   57yo male admitted with dyspnea.  Pt. lives at home with spouse.     Action/Plan:   discharge planning   Anticipated DC Date:  12/14/2012   Anticipated DC Plan:  HOME/SELF CARE      DC Planning Services  CM consult      Choice offered to / List presented to:             Status of service:  In process, will continue to follow Medicare Important Message given?   (If response is "NO", the following Medicare IM given date fields will be blank) Date Medicare IM given:   Date Additional Medicare IM given:    Discharge Disposition:    Per UR Regulation:  Reviewed for med. necessity/level of care/duration of stay  If discussed at Long Length of Stay Meetings, dates discussed:    Comments:  12/12/12 1600 Noted DME referral for [facility].  TC to Morrie Sheldon, representative from [facility], to give referral. NCM will follow for further dc needs. Tera Mater, RN, BSN NCM [phone]

## 2012-12-13 DIAGNOSIS — I509 Heart failure, unspecified: Secondary | ICD-10-CM

## 2012-12-13 DIAGNOSIS — J441 Chronic obstructive pulmonary disease with (acute) exacerbation: Principal | ICD-10-CM

## 2012-12-13 LAB — BASIC METABOLIC PANEL
Calcium: 8.8 mg/dL (ref 8.4–10.5)
GFR calc Af Amer: >90 mL/min (ref >90)
GFR calc non Af Amer: >90 mL/min (ref >90)
Potassium: 4.1 mEq/L (ref 3.5–5.1)
Sodium: 136 mEq/L (ref 135–145)

## 2012-12-13 MED ORDER — GUAIFENESIN ER 600 MG PO TB12: 1200.0000 mg | ORAL_TABLET | Freq: Two times a day (BID) | ORAL | Status: DC

## 2012-12-13 MED ORDER — FUROSEMIDE 20 MG PO TABS: 20.0000 mg | ORAL_TABLET | Freq: Two times a day (BID) | ORAL | Status: DC

## 2012-12-13 MED ORDER — PREDNISONE 20 MG PO TABS: ORAL_TABLET | ORAL | Status: DC

## 2012-12-13 NOTE — Discharge Summary (Signed)
Physician Discharge Summary  Walter Frazier ZOX:[id number] DOB: 02/23/1956 DOA: 12/11/2012  PCP: Vonita Moss, MD  Admit date: 12/11/2012 Discharge date: 12/13/2012  Time spent: >30 minutes  Recommendations for Outpatient Follow-up:      Follow-up Information   Follow up with Serena Colonel, MD. (Next week, call for appointment on Monday 7/14)    Contact information:   [address] [place] [address] [phone]       Follow up with Vonita Moss, MD. (in 1-2weeks, call for appt )    Contact information:   [address] [place] [address] [phone]     Cardiologist, as scheduled- will need 2-D echo in 2mos   Discharge Diagnoses:  Principal Problem:   Shortness of breath Active Problems:   HTN (hypertension)   Ischemic cardiomyopathy   COPD exacerbation   CHF, acute on chronic   Inspiratory stridor   Preop cardiovascular exam   Discharge Condition: Improved/stable  Diet recommendation: heart healthy  Filed Weights   12/11/12 0900 12/12/12 0512 12/13/12 0455  Weight: 68.8 kg (151 lb 10.8 oz) 68.221 kg (150 lb 6.4 oz) 67.767 kg (149 lb 6.4 oz)    History of present illness:  Walter Frazier is a 56 y.o. male who experienced V. fib arrest with anterior STEMI STATUS post drug-eluting stent to his LAD on May 14 2 had a prolonged hospitalization and intubation with hyperthermia therapy. He was discharged home on July 3, but returned to the emergency department on July 7 with shortness of breath. At that time he was felt to be in mild congestive heart failure and was treated with IV Lasix. He improved and was discharged home from the emergency department. Today (7/10) he presents with shortness of breath. He states that initially oral Lasix and the albuterol inhaler helped but now they do not. He has to sleep sitting up. He feels as though he needs to cough up phlegm but is unable to do so. He awoke suddenly at 3  AM this morning unable to breathe. This prompted his visit to the emergency room today. Further he and his wife are concerned about inspiratory stridor he has had since extubation. He has no difficulty swallowing, no sore throat, no hemoptysis. He is eating well. Apparently has no other signs of systemic illness. He denies chest pain. He smoked a pack a day for 40 years but quit when he had his cardiac arrest in May. Chest x-ray in the emergency department shows emphysema with chronic bronchitic changes. Soft tissue x-rays of the neck show thickening of the area epiglottic fold. He has moderate wheezing throughout his lung fields, but is not in respiratory distress   Hospital Course:  COPD exacerbation with a component of mild acute on chronic systolic CHF -Upon admission patient was placed on nebulized bronchodilators and steroids and supplemental oxygen -He was also diuresed with IV Lasix initially and then it was changed to by mouth Lasix.  -He has Improved clinically and has been ambulating with no difficulty, his O2 sats on room air in the upper 90s and he will not require home O2 and is -He is medically stable for discharge at this time and is to follow up outpatient with his PCP, is also to followup with  cardiology outpatient as scheduled. Inspiratory stridor  -Neck xray showed thickening of the aryepiglottic fold  -Per ENT/ Dr. Pollyann Kennedy subglottic pathology either granuloma or mature fibrosis/stenosis Will need operative endoscopy, possible endoscopic  treatment, possible tracheostomy pending cards clearance  -pt seen by cards>> high risk- please see Dr Ludwig Clarks full consult note>> recommend to hold off for 3mos unless emergent  - Per Dr. Bernestine Amass notes of 7/11 he spoke with cardiology and recommends referring patient to [facility] if he worsens -Patient is clinically improved- has not had any further stridor and is anxious to be discharged at this time, and so I discussed him  with Dr. Jearld Fenton on call for Dr. Pollyann Kennedy and he recommends for patient to followup with Dr. Pollyann Kennedy at the office early next week(-is to call Monday for appt) so that he can go over the plan of care/further recs with him and that it would likely involve referral to Dr. Viann Shove at [facility].  Ischemic cardiomyopathy  Cardiac arrest in May 2014  Troponin x 1 is WNL  Echo in 11/2012 shows EF of 25 - 30  -Per cards felt not to be a candidate for LifeVest 2/2 concern for inappropriate shocks  -Follow-up echo in ~ 2 months recommended to reassess EF with optimal medical therapy- continue outpt meds  HTN  Well controlled.  Continue previous home medications  H/O SZS  -continue outpt medss   Consultants:  Ccm  Cards  ENT   Procedures:  NONE     Discharge Exam: Filed Vitals:   12/12/12 2040 12/13/12 0455 12/13/12 0725 12/13/12 1145  BP:  99/66    Pulse: 75 63    Temp:  98 F (36.7 C)    TempSrc:  Oral    Resp: 18 16    Height:      Weight:  67.767 kg (149 lb 6.4 oz)    SpO2: 98% 97% 98% 96%    Exam:  General: alert & oriented x In NAD  Cardiovascular: RRR, nl S1 s2  Respiratory: CTAB, no stridor  Abdomen: soft +BS NT/ND, no masses palpable  Extremities: No cyanosis and no edema   Discharge Instructions  Discharge Orders   Future Appointments Provider Department Dept Phone   01/14/2013 11:00 AM Ranelle Oyster, MD [facility] [phone]   Future Orders Complete By Expires     Diet - low sodium heart healthy  As directed     Increase activity slowly  As directed         Medication List         albuterol 108 (90 BASE) MCG/ACT inhaler  Commonly known as:  PROVENTIL HFA;VENTOLIN HFA  Inhale 2 puffs into the lungs every 6 (six) hours as needed for wheezing.     aspirin 81 MG chewable tablet  Chew 1 tablet (81 mg total) by mouth daily.     atorvastatin 80 MG tablet  Commonly known as:  LIPITOR  Take 1 tablet (80 mg total) by  mouth daily at 6 PM.     carvedilol 6.25 MG tablet  Commonly known as:  COREG  Take 1 tablet (6.25 mg total) by mouth 2 (two) times daily with a meal.     clopidogrel 75 MG tablet  Commonly known as:  PLAVIX  Take 1 tablet (75 mg total) by mouth daily with breakfast.     enalapril 2.5 MG tablet  Commonly known as:  VASOTEC  Take 1 tablet (2.5 mg total) by mouth daily.     furosemide 20 MG tablet  Commonly known as:  LASIX  Take 1 tablet (20 mg total) by mouth 2 (two) times daily.     guaiFENesin  600 MG 12 hr tablet  Commonly known as:  MUCINEX  Take 2 tablets (1,200 mg total) by mouth 2 (two) times daily.     phenytoin 200 MG ER capsule  Commonly known as:  DILANTIN  Take 1 capsule (200 mg total) by mouth daily.     phenytoin 300 MG ER capsule  Commonly known as:  DILANTIN  Take 1 capsule (300 mg total) by mouth at bedtime.     predniSONE 20 MG tablet  Commonly known as:  DELTASONE  Take 2 tablets daily for 5 days, then 1 tablet daily for 5 days and then stop.     QUEtiapine 50 MG tablet  Commonly known as:  SEROQUEL  Take 3 tablets (150 mg total) by mouth at bedtime.     spironolactone 12.5 mg Tabs  Commonly known as:  ALDACTONE  Take 1 tablet (25 mg total) by mouth daily.     valproic acid 250 MG capsule  Commonly known as:  DEPAKENE  Take 3 capsules (750 mg total) by mouth 2 (two) times daily.       No Known Allergies     Follow-up Information   Follow up with Serena Colonel, MD. (Next week, call for appointment on Monday 7/14)    Contact information:   [address] [place] [address] [phone]       Follow up with Vonita Moss, MD. (in 1-2weeks, call for appt )    Contact information:   [address] [place] [address] [phone]        The results of significant diagnostics from this hospitalization (including imaging, microbiology, ancillary and laboratory) are listed below for  reference.    Significant Diagnostic Studies: Dg Neck Soft Tissue  12/11/2012   *RADIOLOGY REPORT*  Clinical Data: Shortness of breath  NECK SOFT TISSUES - 1+ VIEW  Comparison: None.  Findings: Technically limited study due to overlying tubing.  As visualized, the epiglottis is not thickened.  There may be some thickening of the aryepiglottic folds which can be associated with croup.  Visualized airway appears patent on single view.  No submental soft tissue swelling.  Visualized cervical vertebra demonstrate normal alignment.  IMPRESSION: Suggestion of thickening of the aryepiglottic fold which may be associated with croup.   Original Report Authenticated By: Burman Nieves, M.D.   Dg Chest 2 View  12/11/2012   *RADIOLOGY REPORT*  Clinical Data: Shortness of breath and wheezing.  CHEST - 2 VIEW  Comparison: 12/08/2012  Findings: Normal heart size and pulmonary vascularity. Emphysematous changes and scattered fibrosis in the lungs.  No focal consolidation or airspace disease.  No blunting of costophrenic angles.  Peribronchial thickening suggesting chronic bronchitis.  Healing right anterior rib fractures.  Degenerative changes in the spine.  IMPRESSION: Emphysema and chronic bronchitic changes.  No evidence of active pulmonary disease.   Original Report Authenticated By: Burman Nieves, M.D.   Dg Abd 1 View  11/17/2012   *RADIOLOGY REPORT*  Clinical Data: Oral contrast assessment prior to possible gastrostomy tube placement.  ABDOMEN - 1 VIEW  Comparison: None.  Findings: A nasogastric tube is present in the distal stomach. Underlying bowel gas pattern shows no evidence of obstruction or significant ileus.  There is no contrast visualized in the stomach or bowel.  No abnormal calcifications are seen.  Degenerative changes are present in the lower lumbar spine.  IMPRESSION: No oral contrast is identified in the stomach or bowel.  There is no evidence of obstructive pattern or significant ileus.    Original Report Authenticated By: Irish Lack, M.D.   Dg Chest Port 1 View  12/08/2012   *RADIOLOGY REPORT*  Clinical Data: Shortness of breath.  PORTABLE CHEST - 1 VIEW  Comparison: 11/11/2012  Findings: Interval removal of tracheostomy and central venous catheters.  Enteric tube has also been removed.  Shallow inspiration with some elevation of the left hemidiaphragm. The heart size and pulmonary vascularity are normal. The lungs appear clear and expanded without focal air space disease or consolidation. No blunting of the costophrenic angles.  No pneumothorax.  Mediastinal contours appear intact.  IMPRESSION: Interval removal appliances.  No evidence of active pulmonary disease.   Original Report Authenticated By: Burman Nieves, M.D.   Dg Abd Portable 1v  11/18/2012   *RADIOLOGY REPORT*  Clinical Data: Evaluate barium prior to G tube placement  PORTABLE ABDOMEN - 1 VIEW  Comparison: Prior KUB 11/17/2012  Findings: Barium contrast is noted throughout the small bowel. There is a moderate amount of retained barium in the gastric fundus and body of the stomach.  A nasogastric tube is in place.  The tip projects over the gastric antrum.  There may be a small amount of contrast within the cecum.  No acute osseous abnormality.  No evidence of bowel obstruction.  IMPRESSION:  1.  Barium contrast noted throughout the small bowel with some residual contrast in the stomach. 2.  Recommend repeat KUB in 24 hours.   Original Report Authenticated By: Malachy Moan, M.D.   Dg Swallowing Func-speech Pathology  12/03/2012   Carolan Shiver, CCC-SLP     12/03/2012  9:53 AM Objective Swallowing Evaluation: Modified Barium Swallowing Study   Patient Details  Name: LISLE SKILLMAN MRN: [id number] Date of Birth: 04-Dec-1955  Today's Date: 12/03/2012 Time: 0900-0930 Time Calculation (min): 30 min  Past Medical History: No past medical history on file. Past Surgical History:  Past Surgical History  Procedure Laterality  Date  . Back surgery      ~20 years ago by Dr. Jeral Fruit   HPI:  57 y/o male smoker with no known past medical history was  admitted to [facility] on 5/29 after cardiac arrest from a STEMI. 5/29  started amiodarone and underwent LHC > stent to prox LAD and IABP  placed.  He was intubated and comatose afterwards and was started  on the hypothermia protocol. He transferred to [facility] for further  care on same day.  After re-warming, noted to be myoclonic  requiring further sedation. 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 He improved with ativan,  Dilantin and then Keppra. Final Seizure regimen will be Valproate  and Dilantin. Extubation was attempted but he failed due to  agitation. He was reintubated and then underwent trach placement  6/10. He was treated for E coli UTI with ceftriaxone 6/11 - 6/14.  Post trach placement his MS improved although he still showed  some disorientation and confusion. Transferred to SDU on 6/13,  trach downsized on 6/18; MBS 6/18 - dysphagia 3, nectar-thick  liquids; decannulated 6/20. Admitted to CIR to address severe  deconditioning/anoxic encephalopathy.  Pt has been tolerating a  dysphagia 3 diet with nectars - plan is for D/C home tomorrow.       Recommendation/Prognosis  Clinical Impression Dysphagia Diagnosis: Within Functional Limits  Clinical impression:  Dysphagia has resolved: pt presents with  normal mastication; timely swallow trigger; strong pharyngeal  peristalsis; excellent mobility of hyolaryngeal complex;  no  penetration nor aspiration of any tested consistences.  Recommend  advancing diet to regular with thin liquids; meds whole with thin  liquids.  (Note: Pt cleared throat throughout procedure - this  was not associated with laryngeal penetration/aspiration.)    Swallow Evaluation Recommendations Diet Recommendations: Regular;Thin liquid Liquid Administration via: Cup;Straw Medication Administration: Whole meds with liquid Supervision: Patient able  to self feed Postural Changes and/or Swallow Maneuvers: Seated upright 90  degrees Oral Care Recommendations: Oral care BID   Individuals Consulted Consulted and Agree with Results and Recommendations:  Patient;Patient unable/family or caregiver not available Family Member Consulted:  (wife not present for education)     General:  Type of Study: Modified Barium Swallowing Study Reason for Referral: Objectively evaluate swallowing function Previous Swallow Assessment: MBS on 11/19/12 and recommended Dys.  3 textures with nectar thick liquids Diet Prior to this Study: Dysphagia 3 (soft);Nectar-thick liquids Temperature Spikes Noted: No Respiratory Status: Room air History of Recent Intubation: Yes Length of Intubations (days): 12 days Date extubated: 11/10/12 Behavior/Cognition: Alert;Cooperative Oral Cavity - Dentition: Adequate natural dentition Oral Motor / Sensory Function: Within functional limits Self-Feeding Abilities: Able to feed self Patient Positioning: Upright in chair Baseline Vocal Quality: Clear Volitional Cough: Strong Volitional Swallow: Able to elicit Anatomy:  (likely osteophytes C5-6, C6-7 not confirmed by  radiologist) Pharyngeal Secretions: Not observed secondary MBS  Reason for Referral:  Objectively evaluate swallowing function   Oral Phase Oral Preparation/Oral Phase Oral Phase: WFL Pharyngeal Phase  Pharyngeal Phase Pharyngeal Phase: Within functional limits Cervical Esophageal Phase  Cervical Esophageal Phase Cervical Esophageal Phase: [name]  [name] [name] 12/03/2012, 9:51 AM     Dg Swallowing Func-speech Pathology  11/19/2012   Lenor Derrick, CCC-SLP     11/19/2012  2:59 PM Objective Swallowing Evaluation: Modified Barium Swallowing Study   Patient Details  Name: LANDIS DOWDY MRN: [id number] Date of Birth: February 27, 1956  Today's Date: 11/19/2012 Time: 4098-1191 SLP Time Calculation (min): 32 min  Past Medical History: History reviewed. No pertinent past medical  history. Past  Surgical History: History reviewed. No pertinent past  surgical history. HPI:  DUVALL COMES is an 57 y.o. male with recent Cardiac arrest  and MI. Resp failure s/p tracheostomy, making slow progress.  Currently tolerating TF via NGT, but IR is requested to place  perc G-tube for expected prolonged dysphagia and PCM during  recovery.     Assessment / Plan / Recommendation Clinical Impression  Dysphagia Diagnosis: Mild oral phase dysphagia;Moderate  pharyngeal phase dysphagia Clinical impression: Pt. presents with a mild-moderate  oropharyngeal motor dysphagia, as evidenced by decreased bolus  formation and manipulation, decreased hyoid excursion and  laryngeal elevation, resulting in trace aspiration of thin  liquids, which pt. sensed and coughed aspirate from airway; (1  time penetration of nectar on first swallow) , and difficulty  chewing and manipulating solids in a timely manner.  Pt.'s  cognitive status placed him at a higher risk for aspiration, as  pt. is unable to follow compensatory strategies and is extremely  impulsive at this time.    Treatment Recommendation  Therapy as outlined in treatment plan below    Diet Recommendation Dysphagia 3 (Mechanical Soft);Nectar-thick  liquid (Chopped meats)   Liquid Administration via: Cup;No straw Medication Administration: Whole meds with puree Supervision: Patient able to self feed;Full supervision/cueing  for compensatory strategies Compensations: Slow rate;Small sips/bites (Place PMSV for all  po's) Postural Changes and/or Swallow Maneuvers: Seated upright 90  degrees  Other  Recommendations Oral Care Recommendations: Oral care  QID;Staff/trained caregiver to provide oral care Other Recommendations: Place PMSV during PO intake;Clarify  dietary restrictions;Have oral suction available;Order thickener  from pharmacy;Prohibited food (jello, ice cream, thin soups)   Follow Up Recommendations  Inpatient Rehab    Frequency and Duration min 2x/week  2 weeks    Pertinent Vitals/Pain n/a    SLP Swallow Goals Patient will consume recommended diet without observed clinical  signs of aspiration with: Minimal assistance;Supervision/safety Patient will utilize recommended strategies during swallow to  increase swallowing safety with: Minimal  assistance;Supervision/safety   General HPI: MENA LIENAU is an 57 y.o. male with recent  Cardiac arrest and MI. Resp failure s/p tracheostomy, making slow  progress. Currently tolerating TF via NGT, but IR is requested to  place perc G-tube for expected prolonged dysphagia and PCM during  recovery. Type of Study: Modified Barium Swallowing Study Reason for Referral: Objectively evaluate swallowing function Previous Swallow Assessment: BSE 11/17/12, Cont. NPO Diet Prior to this Study: NPO Temperature Spikes Noted: No Respiratory Status: Trach Trach Size and Type: #6;With PMSV in place History of Recent Intubation: Yes Length of Intubations (days): 12 days Date extubated: 11/10/12 Behavior/Cognition: Alert;Cooperative;Pleasant  mood;Distractible;Requires cueing;Decreased sustained attention Oral Cavity - Dentition: Adequate natural dentition Oral Motor / Sensory Function: Within functional limits Self-Feeding Abilities: Able to feed self;Needs assist  (Impulsive) Patient Positioning: Upright in chair Baseline Vocal Quality: Low vocal intensity Volitional Cough: Strong Volitional Swallow: Unable to elicit Anatomy:  (Trach in place) Pharyngeal Secretions: Not observed secondary MBS (Coughing  thick, tan, secretions (malordorous))    Reason for Referral Objectively evaluate swallowing function   Oral Phase Oral Preparation/Oral Phase Oral Phase: Impaired Oral - Solids Oral - Puree: Impaired mastication;Weak lingual  manipulation;Piecemeal swallowing;Delayed oral transit Oral - Mechanical Soft: Impaired mastication;Weak lingual  manipulation;Piecemeal swallowing;Delayed oral transit Oral - Pill:  (Chewed pill)   Pharyngeal Phase Pharyngeal  Phase Pharyngeal Phase: Impaired Pharyngeal - Nectar Pharyngeal - Nectar Teaspoon: Reduced epiglottic  inversion;Reduced anterior laryngeal mobility;Reduced laryngeal  elevation;Reduced airway/laryngeal closure;Reduced tongue base  retraction;Penetration/Aspiration during swallow (Initial swallow  only) Penetration/Aspiration details (nectar teaspoon): Material enters  airway, remains ABOVE vocal cords then ejected out (Initial  swallow only) Pharyngeal - Thin Pharyngeal - Thin Cup: Reduced epiglottic inversion;Reduced  anterior laryngeal mobility;Reduced laryngeal  elevation;Penetration/Aspiration during swallow;Trace aspiration Penetration/Aspiration details (thin cup): Material enters  airway, passes BELOW cords then ejected out Pharyngeal Phase - Comment Pharyngeal Comment: Positive cough response with aspiration, and  aspirate was cleared trom the trachea.  Cervical Esophageal Phase    GO    Cervical Esophageal Phase Cervical Esophageal Phase: Vicente Masson T 11/19/2012, 2:59 PM     Microbiology: No results found for this or any previous visit (from the past 240 hour(s)).   Labs: Basic Metabolic Panel:  Recent Labs Lab 12/08/12 0201 12/08/12 0755 12/11/12 0550 12/11/12 1005 12/12/12 0450 12/13/12 0407  NA 135 140 139  --  138 136  K 4.3 3.6 3.7  --  4.5 4.1  CL 103 105 102  --  101 96  CO2 27 27 26   --  31 31  GLUCOSE 108* 112* 124*  --  105* 119*  BUN 8 7 8   --  11 16  CREATININE 0.67 0.66 0.71 0.75 0.73 0.79  CALCIUM 8.5 8.2* 8.6  --  8.4 8.8   Liver Function Tests:  Recent Labs Lab 12/11/12 0550  AST  14  ALT 14  ALKPHOS 107  BILITOT 0.1*  PROT 6.2  ALBUMIN 2.8*   No results found for this basename: LIPASE, AMYLASE,  in the last 168 hours No results found for this basename: AMMONIA,  in the last 168 hours CBC:  Recent Labs Lab 12/08/12 0755 12/11/12 0550 12/11/12 1005 12/12/12 0450  WBC 10.5 10.8* 8.6 10.2  NEUTROABS  --  8.0*  --   --   HGB 11.9*  12.5* 12.7* 12.6*  HCT 35.4* 37.0* 37.8* 37.4*  MCV 93.9 94.1 94.3 93.7  PLT 221 228 219 219   Cardiac Enzymes:  Recent Labs Lab 12/08/12 0201 12/08/12 0800 12/11/12 0550  TROPONINI <0.30 0.35* <0.30   BNP: BNP (last 3 results)  Recent Labs  12/08/12 0201 12/11/12 0550  PROBNP 923.2* 965.2*   CBG: No results found for this basename: GLUCAP,  in the last 168 hours     Signed:  Kalei Mckillop C  [facility] 12/13/2012, 2:22 PM

## 2012-12-19 ENCOUNTER — Telehealth: Payer: Self-pay

## 2012-12-19 NOTE — Telephone Encounter (Signed)
[facility] would like orders to release patient back to physical therapy and speech therapy.

## 2012-12-19 NOTE — Telephone Encounter (Signed)
I didn't prevent him from going in the first place. If this pertains to his his hospital/ED encounter on 7/10 for tracheobronchitis, then perhaps they should be contacting the treating physician.

## 2012-12-20 ENCOUNTER — Emergency Department (HOSPITAL_COMMUNITY): Payer: BC Managed Care – PPO

## 2012-12-20 ENCOUNTER — Emergency Department (HOSPITAL_COMMUNITY)
Admission: EM | Admit: 2012-12-20 | Discharge: 2012-12-20 | Disposition: A | Discharge status: Home / Self Care | Payer: BC Managed Care – PPO | Attending: Emergency Medicine | Admitting: Emergency Medicine

## 2012-12-20 ENCOUNTER — Encounter (HOSPITAL_COMMUNITY): Payer: Self-pay | Admitting: Emergency Medicine

## 2012-12-20 DIAGNOSIS — I10 Essential (primary) hypertension: Secondary | ICD-10-CM | POA: Insufficient documentation

## 2012-12-20 DIAGNOSIS — Z8669 Personal history of other diseases of the nervous system and sense organs: Secondary | ICD-10-CM | POA: Insufficient documentation

## 2012-12-20 DIAGNOSIS — Z8679 Personal history of other diseases of the circulatory system: Secondary | ICD-10-CM | POA: Insufficient documentation

## 2012-12-20 DIAGNOSIS — Z79899 Other long term (current) drug therapy: Secondary | ICD-10-CM | POA: Insufficient documentation

## 2012-12-20 DIAGNOSIS — R059 Cough, unspecified: Secondary | ICD-10-CM | POA: Insufficient documentation

## 2012-12-20 DIAGNOSIS — R05 Cough: Secondary | ICD-10-CM | POA: Insufficient documentation

## 2012-12-20 DIAGNOSIS — Z9861 Coronary angioplasty status: Secondary | ICD-10-CM | POA: Insufficient documentation

## 2012-12-20 DIAGNOSIS — I252 Old myocardial infarction: Secondary | ICD-10-CM | POA: Insufficient documentation

## 2012-12-20 DIAGNOSIS — Z87891 Personal history of nicotine dependence: Secondary | ICD-10-CM | POA: Insufficient documentation

## 2012-12-20 DIAGNOSIS — E785 Hyperlipidemia, unspecified: Secondary | ICD-10-CM | POA: Insufficient documentation

## 2012-12-20 DIAGNOSIS — J441 Chronic obstructive pulmonary disease with (acute) exacerbation: Secondary | ICD-10-CM | POA: Insufficient documentation

## 2012-12-20 DIAGNOSIS — R079 Chest pain, unspecified: Secondary | ICD-10-CM | POA: Insufficient documentation

## 2012-12-20 DIAGNOSIS — Z7982 Long term (current) use of aspirin: Secondary | ICD-10-CM | POA: Insufficient documentation

## 2012-12-20 DIAGNOSIS — G40909 Epilepsy, unspecified, not intractable, without status epilepticus: Secondary | ICD-10-CM | POA: Insufficient documentation

## 2012-12-20 DIAGNOSIS — I2510 Atherosclerotic heart disease of native coronary artery without angina pectoris: Secondary | ICD-10-CM | POA: Insufficient documentation

## 2012-12-20 LAB — BASIC METABOLIC PANEL
CO2: 29 mEq/L (ref 19–32)
Calcium: 8.6 mg/dL (ref 8.4–10.5)
Chloride: 101 mEq/L (ref 96–112)
Potassium: 3.9 mEq/L (ref 3.5–5.1)
Sodium: 138 mEq/L (ref 135–145)

## 2012-12-20 LAB — CBC
Hemoglobin: 14.3 g/dL (ref 13.0–17.0)
MCH: 32.8 pg (ref 26.0–34.0)
Platelets: 242 10*3/uL (ref 150–400)
RBC: 4.36 MIL/uL (ref 4.22–5.81)
WBC: 9.8 10*3/uL (ref 4.0–10.5)

## 2012-12-20 LAB — TROPONIN I: Troponin I: <0.3 ng/mL (ref <0.30)

## 2012-12-20 LAB — PRO B NATRIURETIC PEPTIDE: Pro B Natriuretic peptide (BNP): 778.4 pg/mL — ABNORMAL HIGH (ref 0–125)

## 2012-12-20 LAB — POCT I-STAT TROPONIN I

## 2012-12-20 MED ORDER — IPRATROPIUM BROMIDE 0.02 % IN SOLN
0.5000 mg | Freq: Once | RESPIRATORY_TRACT | Status: AC
  Administered 2012-12-20: 0.5 mg via RESPIRATORY_TRACT
  Filled 2012-12-20: qty 2.5

## 2012-12-20 MED ORDER — PREDNISONE 50 MG PO TABS: 50.0000 mg | ORAL_TABLET | Freq: Every day | ORAL | Status: DC

## 2012-12-20 MED ORDER — ALBUTEROL SULFATE (5 MG/ML) 0.5% IN NEBU
2.5000 mg | INHALATION_SOLUTION | Freq: Once | RESPIRATORY_TRACT | Status: AC
  Administered 2012-12-20: 2.5 mg via RESPIRATORY_TRACT
  Filled 2012-12-20: qty 0.5

## 2012-12-20 MED ORDER — METHYLPREDNISOLONE SODIUM SUCC 125 MG IJ SOLR
125.0000 mg | Freq: Once | INTRAMUSCULAR | Status: AC
  Administered 2012-12-20: 125 mg via INTRAVENOUS
  Filled 2012-12-20: qty 2

## 2012-12-20 NOTE — ED Provider Notes (Signed)
History    CSN: [id number] Arrival date & time 12/20/12  0055  First MD Initiated Contact with Patient 12/20/12 0111     Chief Complaint  Patient presents with  . Shortness of Breath   (Consider location/radiation/quality/duration/timing/severity/associated sxs/prior Treatment) Patient is a 57 y.o. male presenting with shortness of breath. The history is provided by the patient.  Shortness of Breath He has been having episodes of difficulty breathing ever since having a major heart attack at 2-1/2 months ago. Since then, he has been having episodes of difficulty breathing. He had to be hospitalized on at least one occasion because of that. Today, he had recurrence of the difficulty breathing. He also has a cough which is nonproductive. He does have some intermittent right-sided chest pain which is only present when he coughs. There's been no nausea vomiting or diaphoresis. Dyspnea is not affected by body position or exertion. He has used his rescue inhaler which did give some temporary relief. Past Medical History  Diagnosis Date  . Coronary artery disease     a. VT Arrest/Ant STEMI- Cath/PCI: DES to LAD (no other details of cath/pci in Epic).  . Ischemic cardiomyopathy     a. 11/2012 Echo: Ef 25-30%, septal apical & ant HK. No apical thrombus.  . Anoxic encephalopathy     a. s/p VT arrest 10/2012.  . Seizure disorder     a. in setting of encephalopathy post VT arrest 11/2012.  Marland Kitchen Hypertension   . Hyperlipidemia   . Dysphagia     a. 11/2012 s/p arrest with prolonged intubation and Trach.  . Tobacco abuse   . Seizures   . Shortness of breath 12/2012  . Myocardial infarction 10/2012   Past Surgical History  Procedure Laterality Date  . Back surgery      ~20 years ago by Dr. Jeral Fruit  . Cardiac catheterization    . Coronary angioplasty with stent placement    . Tracheostomy  10/2012  . Tracheostomy closure  12/2012   Family History  Problem Relation Age of Onset  . Heart disease  Father    History  Substance Use Topics  . Smoking status: Former Smoker -- 1.00 packs/day for 30 years    Types: Cigarettes    Quit date: 10/30/2012  . Smokeless tobacco: Never Used  . Alcohol Use: 6.0 oz/week    12 drink(s) per week     Comment: used to drink ~ 6 pack beer/weekend    Review of Systems  Respiratory: Positive for shortness of breath.   All other systems reviewed and are negative.    Allergies  Review of patient's allergies indicates no known allergies.  Home Medications   Current Outpatient Rx  Name  Route  Sig  Dispense  Refill  . albuterol (PROVENTIL HFA;VENTOLIN HFA) 108 (90 BASE) MCG/ACT inhaler   Inhalation   Inhale 2 puffs into the lungs every 6 (six) hours as needed for wheezing.   1 Inhaler   2   . aspirin 81 MG chewable tablet   Oral   Chew 1 tablet (81 mg total) by mouth daily.         Marland Kitchen atorvastatin (LIPITOR) 80 MG tablet   Oral   Take 1 tablet (80 mg total) by mouth daily at 6 PM.   30 tablet   1   . carvedilol (COREG) 6.25 MG tablet   Oral   Take 1 tablet (6.25 mg total) by mouth 2 (two) times daily with a meal.  60 tablet   1   . clopidogrel (PLAVIX) 75 MG tablet   Oral   Take 1 tablet (75 mg total) by mouth daily with breakfast.   30 tablet   1   . enalapril (VASOTEC) 2.5 MG tablet   Oral   Take 1 tablet (2.5 mg total) by mouth daily.   30 tablet   1   . furosemide (LASIX) 20 MG tablet   Oral   Take 1 tablet (20 mg total) by mouth 2 (two) times daily.   60 tablet   0   . guaiFENesin (MUCINEX) 600 MG 12 hr tablet   Oral   Take 2 tablets (1,200 mg total) by mouth 2 (two) times daily.   60 tablet   0   . phenytoin (DILANTIN) 200 MG ER capsule   Oral   Take 1 capsule (200 mg total) by mouth daily.   30 capsule   1   . phenytoin (DILANTIN) 300 MG ER capsule   Oral   Take 1 capsule (300 mg total) by mouth at bedtime.   30 capsule   1   . predniSONE (DELTASONE) 20 MG tablet      Take 2 tablets daily for  5 days, then 1 tablet daily for 5 days and then stop.   15 tablet   0   . QUEtiapine (SEROQUEL) 50 MG tablet   Oral   Take 3 tablets (150 mg total) by mouth at bedtime.   30 tablet   1   . spironolactone (ALDACTONE) 12.5 mg TABS   Oral   Take 1 tablet (25 mg total) by mouth daily.   30 tablet   1   . valproic acid (DEPAKENE) 250 MG capsule   Oral   Take 3 capsules (750 mg total) by mouth 2 (two) times daily.   90 capsule   1    BP 109/63  Temp(Src) 98.1 F (36.7 C) (Oral)  Resp 18  SpO2 99% Physical Exam  Nursing note and vitals reviewed.  57 year old male, resting comfortably and in no acute distress. Vital signs are normal. Oxygen saturation is 99%, which is normal. Head is normocephalic and atraumatic. PERRLA, EOMI. Oropharynx is clear. Neck is nontender and supple without adenopathy or JVD. Back is nontender and there is no CVA tenderness. Lungs have some mild inspiratory stridor. Mild expiratory wheezes are present primarily in the upper lobes. There are no rales or rhonchi. Chest is nontender. Heart has regular rate and rhythm without murmur. Abdomen is soft, flat, nontender without masses or hepatosplenomegaly and peristalsis is normoactive. Extremities have no cyanosis or edema, full range of motion is present. Skin is warm and dry without rash. Neurologic: Mental status is normal, cranial nerves are intact, there are no motor or sensory deficits.  ED Course  Procedures (including critical care time) Results for orders placed during the hospital encounter of 12/20/12  CBC      Result Value Range   WBC 9.8  4.0 - 10.5 K/uL   RBC 4.36  4.22 - 5.81 MIL/uL   Hemoglobin 14.3  13.0 - 17.0 g/dL   HCT 40.9  81.1 - 91.4 %   MCV 94.0  78.0 - 100.0 fL   MCH 32.8  26.0 - 34.0 pg   MCHC 34.9  30.0 - 36.0 g/dL   RDW 78.2  95.6 - 21.3 %   Platelets 242  150 - 400 K/uL  BASIC METABOLIC PANEL      Result  Value Range   Sodium 138  135 - 145 mEq/L   Potassium 3.9  3.5  - 5.1 mEq/L   Chloride 101  96 - 112 mEq/L   CO2 29  19 - 32 mEq/L   Glucose, Bld 101 (*) 70 - 99 mg/dL   BUN 13  6 - 23 mg/dL   Creatinine, Ser 4.09  0.50 - 1.35 mg/dL   Calcium 8.6  8.4 - 81.1 mg/dL   GFR calc non Af Amer >90  >90 mL/min   GFR calc Af Amer >90  >90 mL/min  PRO B NATRIURETIC PEPTIDE      Result Value Range   Pro B Natriuretic peptide (BNP) 778.4 (*) 0 - 125 pg/mL  TROPONIN I      Result Value Range   Troponin I <0.30  <0.30 ng/mL  POCT I-STAT TROPONIN I      Result Value Range   Troponin i, poc 0.12 (*) 0.00 - 0.08 ng/mL   Comment NOTIFIED PHYSICIAN     Comment 3             Dg Chest Port 1 View  12/20/2012   *RADIOLOGY REPORT*  Clinical Data: Shortness of breath  PORTABLE CHEST - 1 VIEW  Comparison: Prior radiograph from 12/11/2012  Findings: Cardiac and mediastinal silhouettes are stable in size and contour, and remain within normal limits.  There is elevation of the left hemidiaphragm, similar to prior. Linear opacity at the left lung bases most consistent with subsegmental atelectasis and / or scarring, also similar.  No airspace consolidation, pleural effusion, or pulmonary edema is identified.  There is no pneumothorax.  No soft tissue or osseous abnormalities are identified.  IMPRESSION:  No acute cardiopulmonary process.   Original Report Authenticated By: Rise Mu, M.D.     Date: 12/20/2012  Rate: 75  Rhythm: normal sinus rhythm  QRS Axis: normal  Intervals: normal  ST/T Wave abnormalities: J-point elevation in the inferior leads, inverted T waves in the anterior leads and lateral leads  Conduction Disutrbances:none  Narrative Interpretation: Evolutionary changes of myocardial infarction. When compared with ECG of 12/11/2012, further evolution of changes of recent myocardial infarction are present.  Old EKG Reviewed: changes noted   1. COPD exacerbation     MDM  Dyspnea which seems to be predominantly had to do with COPD. He does have  some stridor but this has not seemed to be the cause of his dyspnea. Old records are reviewed and he had a prolonged hospital stay following ventricular tachycardia cardiac arrest and was on the erect exam protocol. He had a subsequent hospitalization for COPD exacerbation. He has been evaluated by ENT regarding his stridor and is scheduled to have fiberoptic laryngoscopy done once it is safe to undergo general anesthesia. He will be given an albuterol with ipratropium nebulizer treatment as well as a dose of methylprednisolone.  He is doing significantly better following albuterol with ipratropium. He is sent home with prescription for prednisone has felt with his PCP in the next 5 days. He is also to followup with ENT as scheduled.  Dione Booze, MD 12/20/12 [phone]

## 2012-12-20 NOTE — ED Notes (Signed)
C/o sob and wheezing since 11pm tonight.  Reports R sided chest pain on the way to hospital that has not resolved.  Pt reports history of MI in May 2014 and recent trach.  Denies pain at present.

## 2012-12-25 ENCOUNTER — Emergency Department: Payer: Self-pay | Admitting: Emergency Medicine

## 2012-12-26 DIAGNOSIS — Z8669 Personal history of other diseases of the nervous system and sense organs: Secondary | ICD-10-CM

## 2012-12-26 DIAGNOSIS — I252 Old myocardial infarction: Secondary | ICD-10-CM

## 2012-12-26 DIAGNOSIS — R778 Other specified abnormalities of plasma proteins: Secondary | ICD-10-CM | POA: Insufficient documentation

## 2012-12-26 DIAGNOSIS — J955 Postprocedural subglottic stenosis: Secondary | ICD-10-CM | POA: Insufficient documentation

## 2012-12-26 DIAGNOSIS — R7989 Other specified abnormal findings of blood chemistry: Secondary | ICD-10-CM

## 2012-12-26 HISTORY — DX: Old myocardial infarction: I25.2

## 2012-12-26 HISTORY — DX: Personal history of other diseases of the nervous system and sense organs: Z86.69

## 2012-12-26 HISTORY — DX: Other specified abnormal findings of blood chemistry: R79.89

## 2012-12-26 LAB — CBC
MCV: 94 fL (ref 80–100)
RBC: 4.06 10*6/uL — ABNORMAL LOW (ref 4.40–5.90)
RDW: 15.1 % — ABNORMAL HIGH (ref 11.5–14.5)
WBC: 13.9 10*3/uL — ABNORMAL HIGH (ref 3.8–10.6)

## 2012-12-26 LAB — COMPREHENSIVE METABOLIC PANEL
Albumin: 3 g/dL — ABNORMAL LOW (ref 3.4–5.0)
Alkaline Phosphatase: 96 U/L (ref 50–136)
BUN: 10 mg/dL (ref 7–18)
Bilirubin,Total: 0.1 mg/dL — ABNORMAL LOW (ref 0.2–1.0)
Co2: 31 mmol/L (ref 21–32)
EGFR (African American): >60
EGFR (Non-African Amer.): >60
Osmolality: 280 (ref 275–301)
Potassium: 4.7 mmol/L (ref 3.5–5.1)
SGOT(AST): 19 U/L (ref 15–37)
SGPT (ALT): 30 U/L (ref 12–78)
Sodium: 141 mmol/L (ref 136–145)
Total Protein: 6.4 g/dL (ref 6.4–8.2)

## 2012-12-26 LAB — PROTIME-INR
INR: 1
Prothrombin Time: 12.9 secs (ref 11.5–14.7)

## 2012-12-26 LAB — TROPONIN I
Troponin-I: 0.53 ng/mL — ABNORMAL HIGH
Troponin-I: 0.58 ng/mL — ABNORMAL HIGH

## 2012-12-31 ENCOUNTER — Inpatient Hospital Stay: Payer: BC Managed Care – PPO | Admitting: Physical Medicine & Rehabilitation

## 2013-01-02 ENCOUNTER — Encounter: Payer: Self-pay | Admitting: Physical Medicine & Rehabilitation

## 2013-01-02 DEATH — deceased

## 2013-01-07 DIAGNOSIS — R1310 Dysphagia, unspecified: Secondary | ICD-10-CM | POA: Insufficient documentation

## 2013-01-12 DIAGNOSIS — R490 Dysphonia: Secondary | ICD-10-CM | POA: Insufficient documentation

## 2013-01-14 ENCOUNTER — Inpatient Hospital Stay: Payer: BC Managed Care – PPO | Admitting: Physical Medicine & Rehabilitation

## 2013-04-21 ENCOUNTER — Ambulatory Visit: Payer: Self-pay | Admitting: Neurology

## 2013-05-20 DIAGNOSIS — J3801 Paralysis of vocal cords and larynx, unilateral: Secondary | ICD-10-CM | POA: Insufficient documentation

## 2013-10-11 IMAGING — CR DG CHEST 1V PORT
1 series · 1 of 1 positions shown · non-contrast
Comparison: None.

CLINICAL DATA: Assess intra-aortic balloon pump.

PORTABLE CHEST - 1 VIEW

[AP]
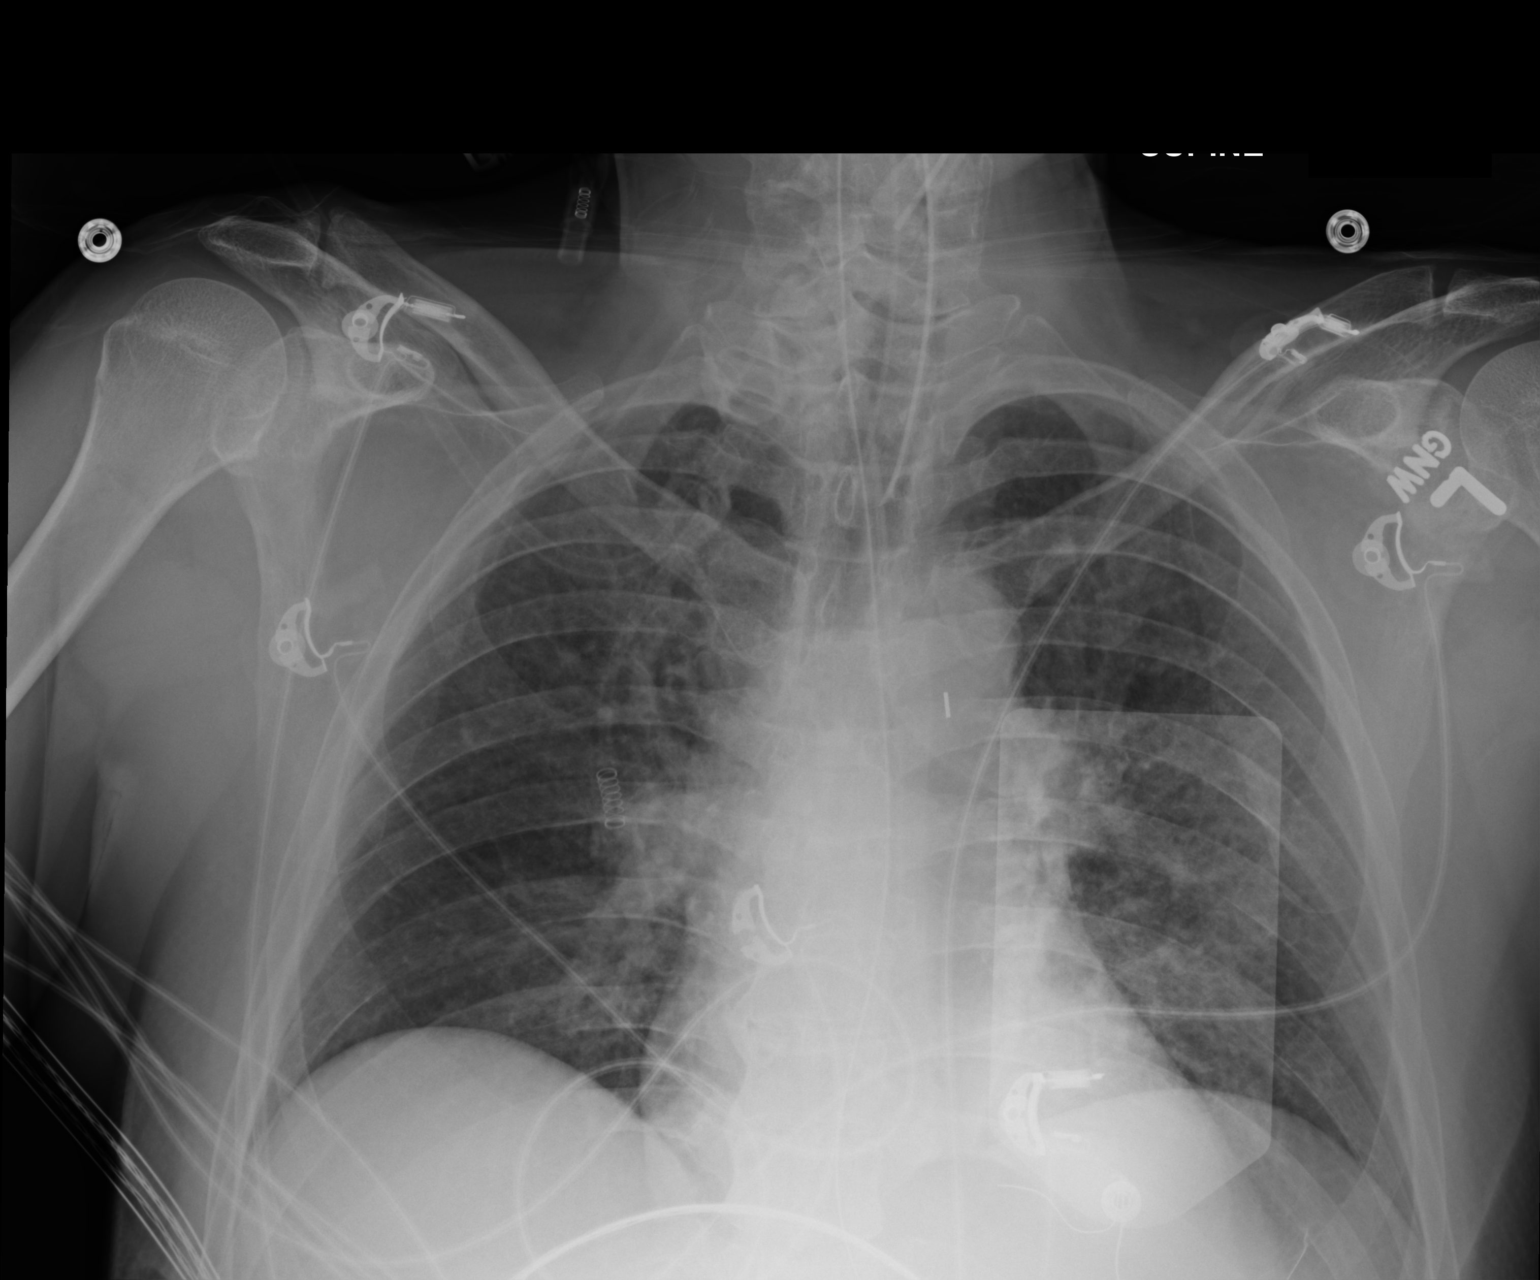

[1 of 1 positions shown; findings below may reference images not displayed]

FINDINGS: The patient's endotracheal tube is seen ending 5-6 cm
above the carina.  An enteric tube is noted extending below the
diaphragm.

The tip of the intra-aortic balloon pump is seen just below the
aortic arch, just above the level of the carina, in appropriate
position.

The lungs are relatively well expanded.  Mild vascular congestion
is noted, without definite pulmonary edema.  The external pacing
pad is noted overlying the left hemithorax.  No pleural effusion or
pneumothorax is seen.

The cardiomediastinal silhouette is normal in size.  No acute
osseous abnormalities are identified.
IMPRESSION: 1.  Tip of the intra-aortic balloon pump noted just above the level
of the carina and just below the aortic arch, in appropriate
position.
2.  Endotracheal tube seen ending 5-6 cm above the carina.
3.  Mild vascular congestion, without definite pulmonary edema.

## 2013-10-12 IMAGING — CR DG CHEST 1V PORT
1 series · 1 of 1 positions shown · non-contrast
Comparison: Chest radiograph performed 10/30/2012

CLINICAL DATA: Central line placement.

PORTABLE CHEST - 1 VIEW

[AP]
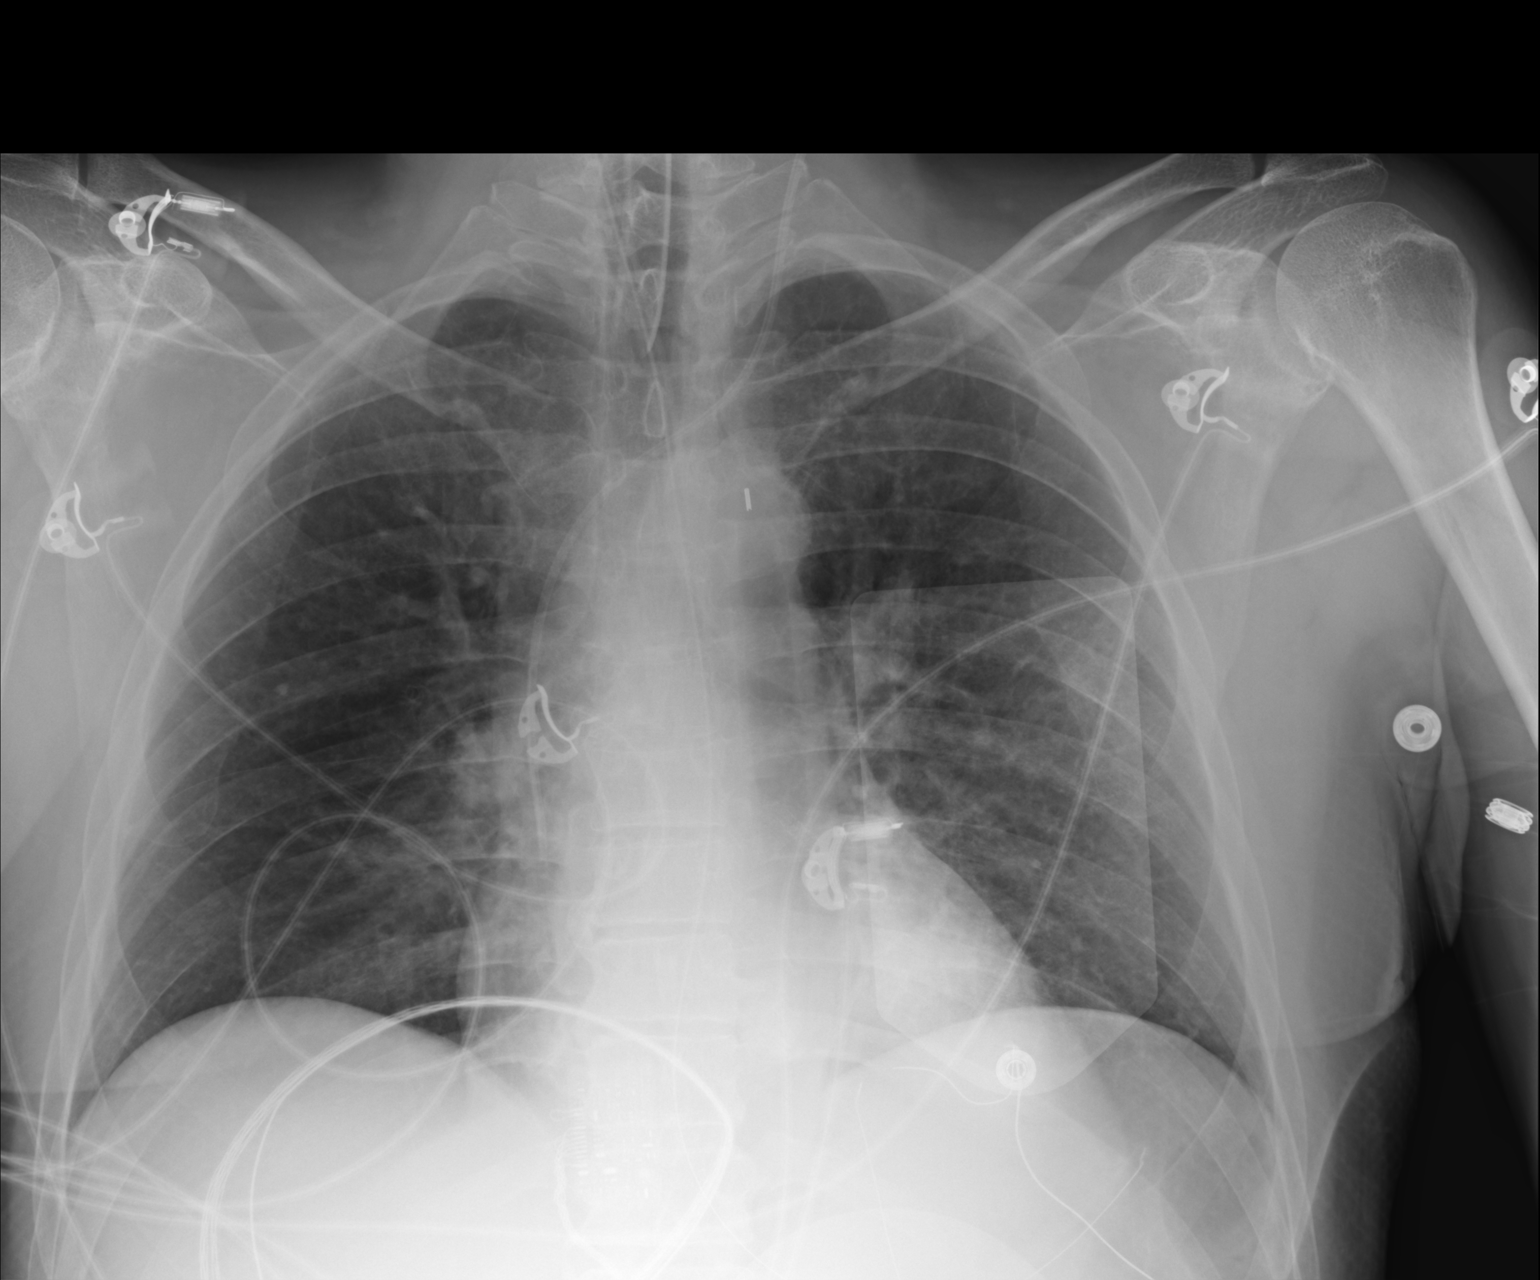

[1 of 1 positions shown; findings below may reference images not displayed]

FINDINGS: The patient's endotracheal tube is seen ending 6-7 cm
above the carina.  A left IJ line is noted ending about the distal
SVC.  An enteric tube is noted extending below the diaphragm.

The patient's intra-aortic balloon pump tip has migrated
approximately 2 cm superiorly since the prior study.

The lungs are well expanded.  Minimal left basilar density is
thought to reflect normal vasculature and the overlying external
pacing pad.  No pleural effusion or pneumothorax is seen.

The cardiomediastinal silhouette remains normal in size.  No acute
osseous abnormalities are seen.
IMPRESSION: 1.  Left IJ line noted ending about the distal SVC.
2.  Endotracheal tube seen ending 6-7 cm above the carina.  Would
advance this by 3-4 cm.
3.  Intra-aortic balloon pump tip has migrated 2 cm superiorly
since the prior study.
Would retract this by 2 cm.
4.  No acute cardiopulmonary process seen.

to Nursing on [id number], who verbally acknowledged these results.

## 2013-10-14 IMAGING — CR DG CHEST 1V PORT
1 series · 1 of 1 positions shown · non-contrast
Comparison: the previous day's study

CLINICAL DATA: Respiratory failure, shortness of breath

PORTABLE CHEST - 1 VIEW

[AP]
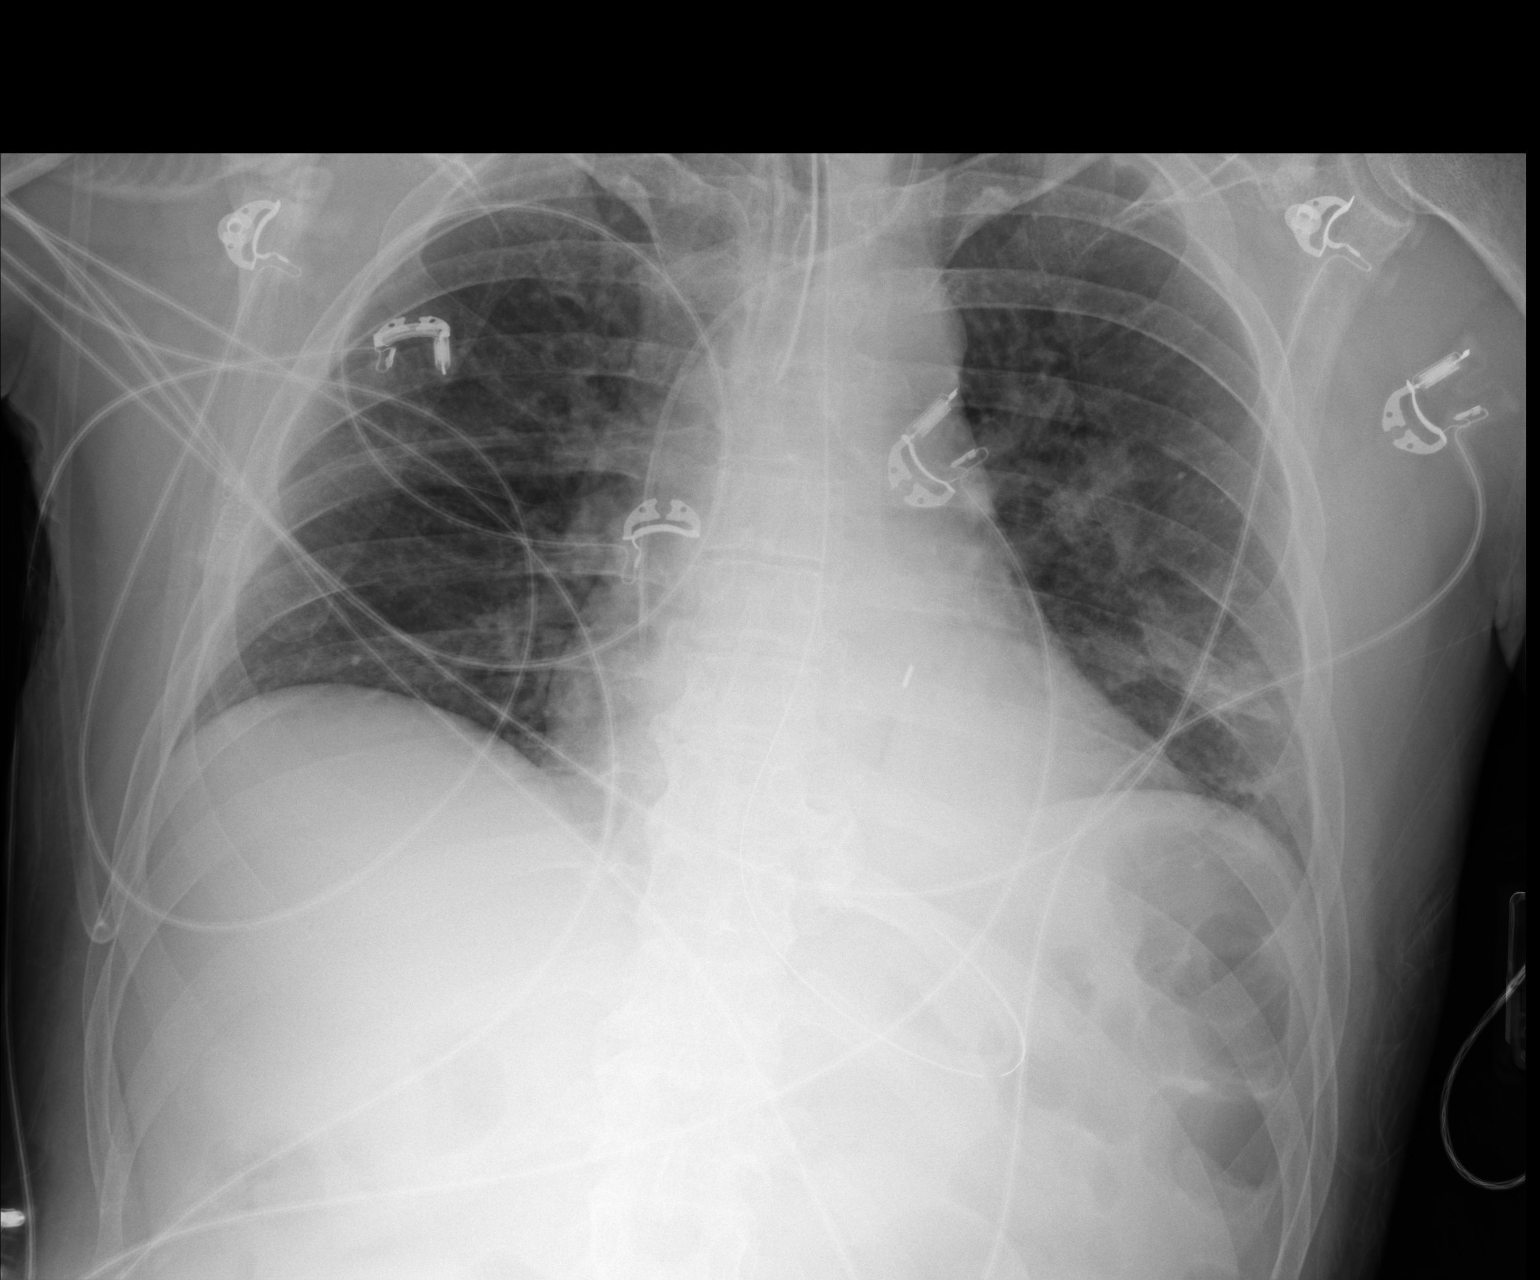

[1 of 1 positions shown; findings below may reference images not displayed]

FINDINGS: Low position of endotracheal tube, only 12 mm above
carina.  Left IJ central line and nasogastric tube are stable in
position.  Patchy left perihilar, retrocardiac, and patchy lower
lung airspace opacities.  No effusion.  Heart size upper limits
normal for technique.  A IABP tip is low, at the level of the left
eighth rib.
IMPRESSION: Low position of endotracheal tube and IABP as above.

## 2013-11-19 IMAGING — CR DG CHEST 1V PORT
1 series · 1 of 1 positions shown · non-contrast
Comparison: 11/11/2012

CLINICAL DATA: Shortness of breath.

PORTABLE CHEST - 1 VIEW

[AP]
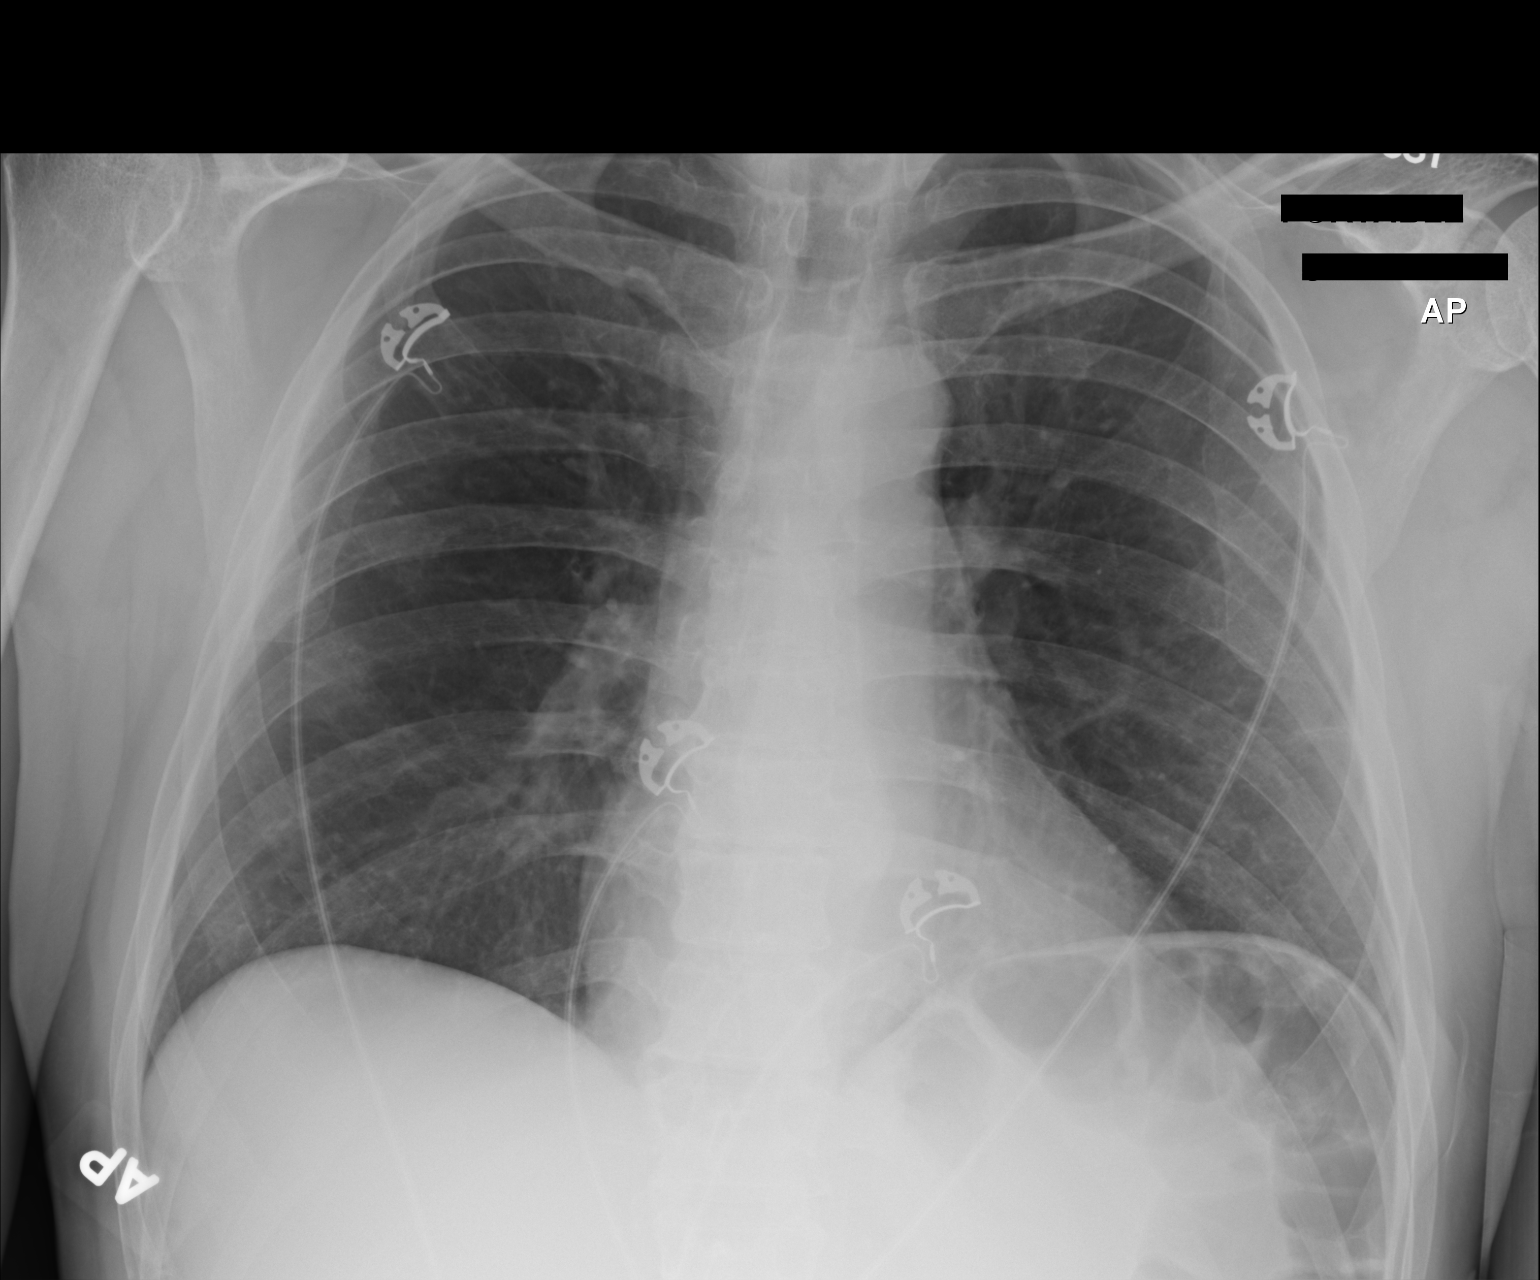

[1 of 1 positions shown; findings below may reference images not displayed]

FINDINGS: Interval removal of tracheostomy and central venous
catheters.  Enteric tube has also been removed.  Shallow
inspiration with some elevation of the left hemidiaphragm. The
heart size and pulmonary vascularity are normal. The lungs appear
clear and expanded without focal air space disease or
consolidation. No blunting of the costophrenic angles.  No
pneumothorax.  Mediastinal contours appear intact.
IMPRESSION: Interval removal appliances.  No evidence of active pulmonary
disease.

## 2013-11-22 IMAGING — CR DG NECK SOFT TISSUE
1 series · 1 of 1 positions shown · non-contrast
Comparison: None.

CLINICAL DATA: Shortness of breath

NECK SOFT TISSUES - 1+ VIEW

[w soft tissue neck lat]
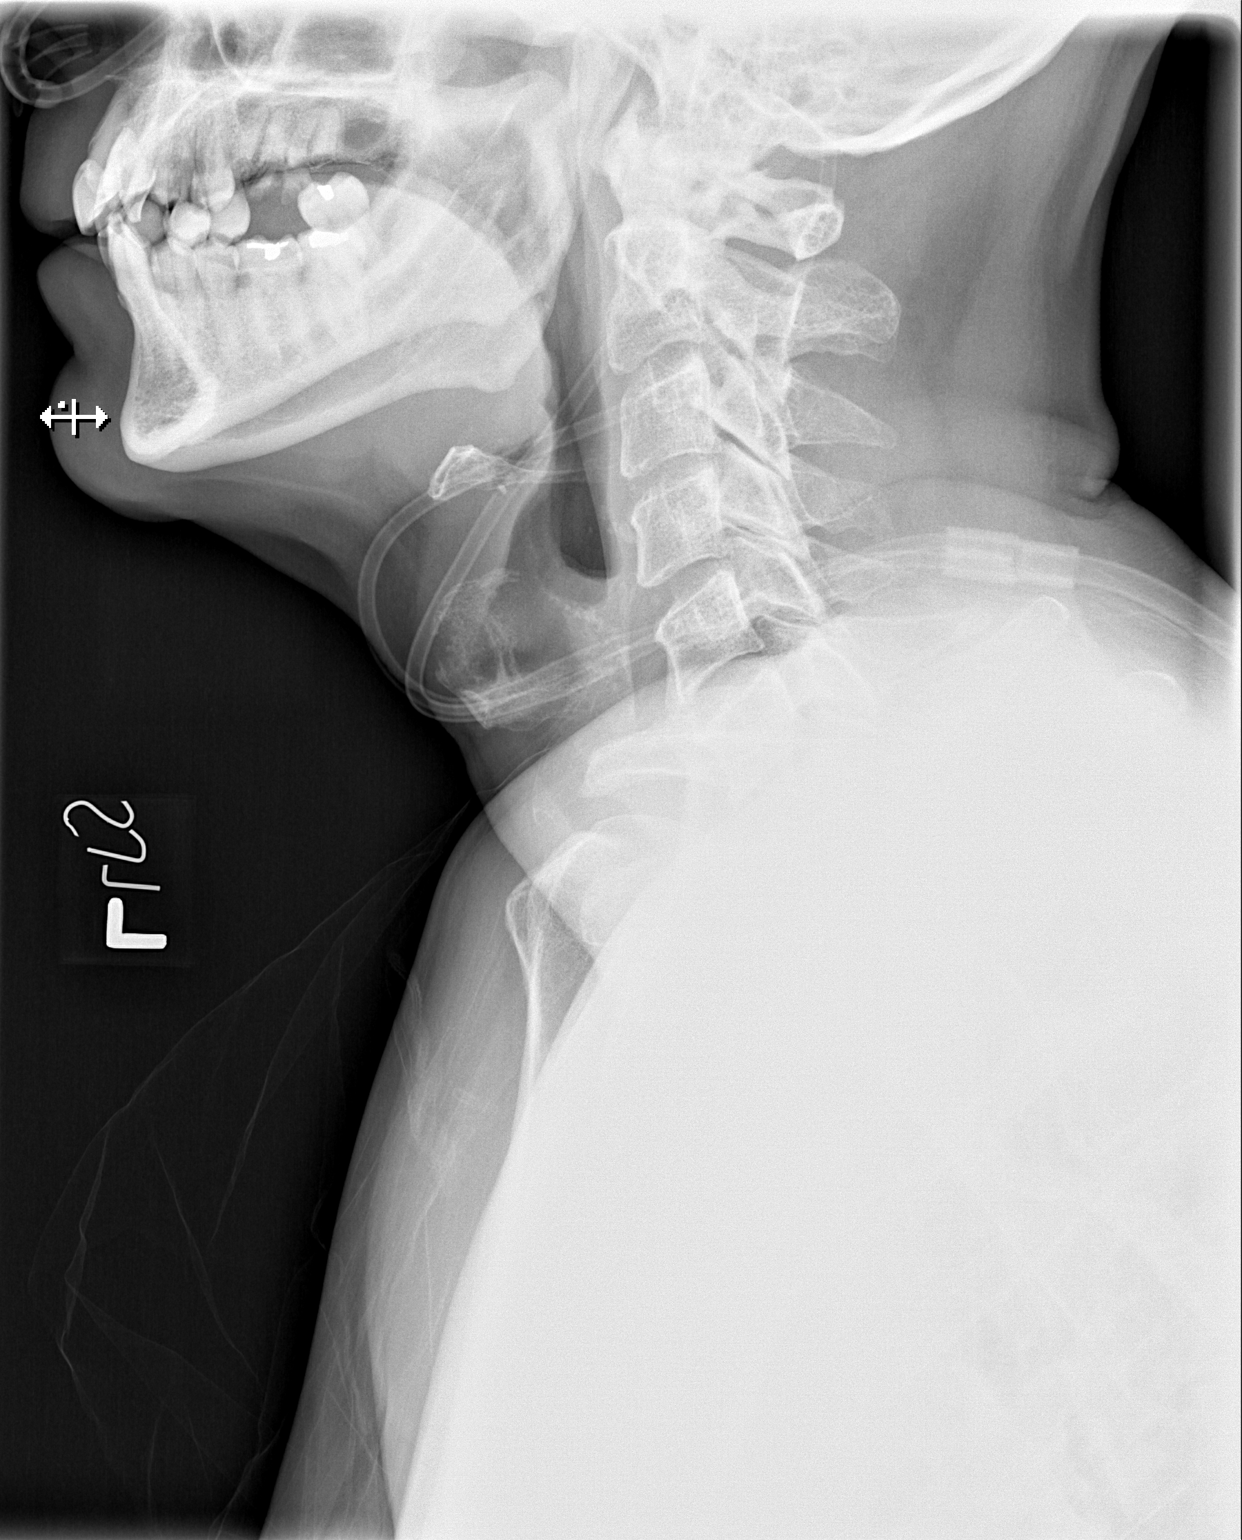

[1 of 1 positions shown; findings below may reference images not displayed]

FINDINGS: Technically limited study due to overlying tubing.  As
visualized, the epiglottis is not thickened.  There may be some
thickening of the aryepiglottic folds which can be associated with
croup.  Visualized airway appears patent on single view.  No
submental soft tissue swelling.  Visualized cervical vertebra
demonstrate normal alignment.
IMPRESSION: Suggestion of thickening of the aryepiglottic fold which may be
associated with croup.

## 2013-12-01 ENCOUNTER — Emergency Department: Payer: Self-pay | Admitting: Emergency Medicine

## 2013-12-01 LAB — BASIC METABOLIC PANEL
Anion Gap: 6 — ABNORMAL LOW (ref 7–16)
BUN: 14 mg/dL (ref 7–18)
CHLORIDE: 108 mmol/L — AB (ref 98–107)
Calcium, Total: 8.7 mg/dL (ref 8.5–10.1)
Co2: 25 mmol/L (ref 21–32)
Creatinine: 1.15 mg/dL (ref 0.60–1.30)
EGFR (African American): >60
EGFR (Non-African Amer.): >60
Glucose: 120 mg/dL — ABNORMAL HIGH (ref 65–99)
OSMOLALITY: 279 (ref 275–301)
Potassium: 3.9 mmol/L (ref 3.5–5.1)
SODIUM: 139 mmol/L (ref 136–145)

## 2013-12-01 LAB — CBC
HCT: 41 % (ref 40.0–52.0)
HGB: 13.7 g/dL (ref 13.0–18.0)
MCH: 30.7 pg (ref 26.0–34.0)
MCHC: 33.4 g/dL (ref 32.0–36.0)
MCV: 92 fL (ref 80–100)
PLATELETS: 289 10*3/uL (ref 150–440)
RBC: 4.47 10*6/uL (ref 4.40–5.90)
RDW: 13.1 % (ref 11.5–14.5)
WBC: 10.6 10*3/uL (ref 3.8–10.6)

## 2013-12-01 LAB — TROPONIN I

## 2014-09-24 NOTE — H&P (Signed)
PATIENT NAME:  Walter Frazier, Walter Frazier MR#:  [id number] DATE OF BIRTH:  05/08/1956  DATE OF ADMISSION:  10/30/2012  REASON FOR ADMISSION:  Cardiopulmonary arrest and ST elevation myocardial infarction.   HISTORY OF PRESENT ILLNESS:  The patient is a 59 year old gentleman who apparently was in his usual state of health until today, when he experienced a cardiopulmonary arrest in the field. The patient apparently received 15 to 20 minutes of CPR and shock. The patient was brought to [facility] Emergency Room, where the patient apparently experienced bradycardia, requiring atropine. The patient was not conscious, and required intubation and mechanical ventilator. A 12-lead electrocardiogram revealed 4 to 5 mm of ST elevation in the anterior precordial leads. The patient was brought to the cardiac catheterization laboratory, and angiography revealed occluded proximal left anterior descending coronary artery. The patient underwent percutaneous coronary intervention with balloon angioplasty, atherectomy and coronary stent. During the procedure, patient experienced multiple episodes of ventricular tachycardia and ventricular fibrillation, requiring cardioversion. The patient was treated with multiple boluses of amiodarone, magnesium, and was on an amiodarone drip. After successful stenting of the proximal left anterior descending coronary artery, intra-aortic balloon pump was inserted and the patient remained hemodynamically stable, without recurrent ventricular arrhythmias.  PAST MEDICAL HISTORY:  Tobacco abuse.   MEDICATIONS:  None.   SOCIAL HISTORY: The patient is married. Apparently smokes a pack of cigarettes a day. Occasionally drinks beers on the weekend.   FAMILY HISTORY:  Positive for coronary artery disease.   REVIEW OF SYSTEMS:  Not obtainable.   PHYSICAL EXAMINATION: VITAL SIGNS:  Blood pressure was 100/70, pulse varied from 70 to 100. GENERAL:  The patient was acutely ill, unresponsive, intubated on a  mechanical ventilator.  HEENT:  Pupils were minimally responsive.  NECK:  Supple.  LUNGS:  Clear to auscultation.  CARDIOVASCULAR: Unable to assess JVP. Normal PMI. Irregular rhythm. Normal S1, S2. No appreciable murmur.  ABDOMEN:  Soft, nontender.  EXTREMITIES:  Cool to touch.  MUSCULOSKELETAL:  Normal muscle tone.  NEUROLOGIC:  The patient was unresponsive, and pupils were minimally response to light.   IMPRESSION: A 59 year old gentleman who presents with anterior ST elevation myocardial infarction, status post percutaneous coronary intervention with coronary stent, with intra-aortic balloon pump. Currently minimally responsive, who underwent at least 15 to 20 minutes of cardiopulmonary resuscitation in the field.   RECOMMENDATIONS: 1.  Admit to CCU. 2.  Intra-aortic balloon pump for 24 to 48 hours. 3.  Code ICE, per protocol.  4.  Continue amiodarone drip for now.  5.  A 2-D echocardiogram to evaluate left ventricular function as well as valvular function, and to rule out mechanical complications from ST elevation myocardial infarction.  4.  Consult Dr. Mortimer Fries for pulmonary and ventilator management.    ____________________________ Isaias Cowman, MD ap:mr D: 10/30/2012 19:09:59 ET T: 10/30/2012 20:01:09 ET JOB#: [id number]  cc: Isaias Cowman, MD, <Dictator> Isaias Cowman MD ELECTRONICALLY SIGNED 11/10/2012 8:47

## 2014-09-24 NOTE — Consult Note (Signed)
PATIENT NAME:  Walter Frazier, Walter Frazier MR#:  [id number] DATE OF BIRTH:  1956-02-25  DATE OF CONSULTATION:  10/30/2012  CONSULTING PHYSICIAN:  Isaias Cowman, MD  REFERRING PHYSICIAN:  Dr.  Thomasene Lot   REASON FOR ADMISSION:  Cardiopulmonary arrest and ST elevation myocardial infarction.   HISTORY OF PRESENT ILLNESS:  The patient is a 59 year old gentleman who apparently was in his usual state of health until today, when he experienced a cardiopulmonary arrest in the field. The patient apparently received 15 to 20 minutes of CPR and shock. The patient was brought to [facility] Emergency Room, where the patient apparently experienced bradycardia, requiring atropine. The patient was not conscious, and required intubation and mechanical ventilator. A 12-lead electrocardiogram revealed 4 to 5 mm of ST elevation in the anterior precordial leads. The patient was brought to the cardiac catheterization laboratory, and angiography revealed occluded proximal left anterior descending coronary artery. The patient underwent percutaneous coronary intervention with balloon angioplasty, atherectomy and coronary stent. During the procedure, patient experienced multiple episodes of ventricular tachycardia and ventricular fibrillation, requiring cardioversion. The patient was treated with multiple boluses of amiodarone, magnesium, and was on an amiodarone drip. After successful stenting of the proximal left anterior descending coronary artery, intra-aortic balloon pump was inserted and the patient remained hemodynamically stable, without recurrent ventricular arrhythmias.  PAST MEDICAL HISTORY:  Tobacco abuse.   MEDICATIONS:  None.   SOCIAL HISTORY: The patient is married. Apparently smokes a pack of cigarettes a day. Occasionally drinks beers on the weekend.   FAMILY HISTORY:  Positive for coronary artery disease.   REVIEW OF SYSTEMS:  Not obtainable.   PHYSICAL EXAMINATION: VITAL SIGNS:  Blood pressure was 100/70, pulse  varied from 70 to 100. GENERAL:  The patient was acutely ill, unresponsive, intubated on a mechanical ventilator.  HEENT:  Pupils were minimally responsive.  NECK:  Supple.  LUNGS:  Clear to auscultation.  CARDIOVASCULAR: Unable to assess JVP. Normal PMI. Irregular rhythm. Normal S1, S2. No appreciable murmur.  ABDOMEN:  Soft, nontender.  EXTREMITIES:  Cool to touch.  MUSCULOSKELETAL:  Normal muscle tone.  NEUROLOGIC:  The patient was unresponsive, and pupils were minimally response to light.   IMPRESSION: A 59 year old gentleman who presents with anterior ST elevation myocardial infarction, status post percutaneous coronary intervention with coronary stent, with intra-aortic balloon pump. Currently minimally responsive, who underwent at least 15 to 20 minutes of cardiopulmonary resuscitation in the field.   RECOMMENDATIONS: 1.  Admit to CCU. 2.  Intra-aortic balloon pump for 24 to 48 hours. 3.  Code ICE, per protocol.  4.  Continue amiodarone drip for now.  5.  A 2-D echocardiogram to evaluate left ventricular function as well as valvular function, and to rule out mechanical complications from ST elevation myocardial infarction.  17.  Consult Dr. Mortimer Fries for pulmonary and ventilator management.    ____________________________ Isaias Cowman, MD ap:mr D: 10/30/2012 19:09:59 ET T: 10/30/2012 20:01:09 ET JOB#: [id number]  cc: Isaias Cowman, MD, <Dictator>

## 2014-09-24 NOTE — Discharge Summary (Signed)
PATIENT NAME:  Walter Frazier, Walter Frazier MR#:  [id number] DATE OF BIRTH:  [birth year]  DATE OF ADMISSION:  10/30/2012 DATE OF DISCHARGE:  10/30/2012  FINAL DIAGNOSES: 1.  Cardiopulmonary arrest. 2.  ST-elevation myocardial infarction   PROCEDURES: 1.  Cardiac catheterization.  2.  Percutaneous coronary intervention with coronary artery stent.  3.  Intra-aortic balloon pump.   HISTORY OF PRESENT ILLNESS: Please see admission H and [place]: The patient presented to [facility] Emergency Room following cardiopulmonary arrest requiring CPR in the field and electrical cardioversion. In the Emergency Room, the patient was intubated and put on mechanical ventilator. EKG revealed 4 to 5 mm of ST elevation in the anterior leads consistent with acute ST elevation myocardial infarction. The patient was brought to the cardiac catheterization laboratory. Coronary arteriography revealed occlusion of the proximal left anterior descending coronary artery. The patient underwent percutaneous coronary intervention. The lesion was crossed with a guidewire and the patient underwent PTCA followed by aspiration, atherectomy and placement of a drug-eluting coronary artery stent. During the procedure, the patient had profound ventricular arrhythmias, ventricular fibrillation and ventricular tachycardia requiring multiple cardioversions. Left ventriculography was performed after placing the stent which revealed apical dyskinesis and moderately reduced left ventricular function. Intra-aortic balloon pump was placed and ventricular arrhythmias resolved. The patient remained unconscious and hypothermia protocol was started. The patient was transferred to [facility] for further care.  ____________________________ Isaias Cowman, MD ap:sb D: 11/14/2012 10:50:48 ET T: 11/14/2012 11:25:44 ET JOB#: [id number]  cc: Isaias Cowman, MD, <Dictator> Isaias Cowman MD ELECTRONICALLY SIGNED 11/17/2012 12:45

## 2014-09-24 NOTE — Consult Note (Signed)
PATIENT NAME:  Walter Frazier, Walter Frazier MR#:  [id number] DATE OF BIRTH:  24-Dec-1955  DATE OF CONSULTATION:  12/26/2012  REFERRING PHYSICIAN:  Dr. Beather Arbour.  CONSULTING PHYSICIAN:  Janalee Dane, MD  HISTORY OF PRESENT ILLNESS: The patient is a 59 year old white male who was initially admitted to [facility] in [place] on or about May 29th. He had had an acute MI. A stent was placed at [facility], and he was placed on a hypothermic protocol at [facility] in [place], at which time he was intubated for 2 weeks. A tracheostomy was placed, and sometime near the end of his hospitalization he was decannulated. At the end of this hospitalization sometime in roughly mid June, he was discharged and since that time, he has presented to the Emergency at [facility] in [place] 3 times and once here at [facility] for stridor. One of those presentations to the Emergency Room, he was admitted to the hospital for a " COPD exacerbation" and was not intubated during that hospitalization. He denies being intubated prior to the long admission at [facility] in [place]. He did quit smoking at the time of his heart attack and has not smoked since. He reports that he responds favorably to the treatment each time but that it returns after about 4 to 5 days.   ALLERGIES, MEDICATIONS, PAST MEDICAL HISTORY: All reviewed and documented in the chart.   PHYSICAL EXAMINATION: The patient is rather healthy-appearing 59 year old gentleman with a thin neck, thin body habitus, in no severe acute distress. When he coughs, I can hear audible inspiratory stridor; however, his voice is strong and fairly normal. On examination with normal breathing, the anterior tracheal wall does collapse very slightly, and there is a visible rise and fall of a vessel immediately adjacent to his tracheostomy scar and to the right of it. On examination of the oral cavity, moderate indentation. No oral cavity or oropharyngeal masses or lesions. Good movement  of the tongue. Good elevation of the palate. Nose: Slight inferior right septal deviation. No other nasal masses or lesions. Ears: The external auditory canals are clear. Moderate cerumen. Tympanic membranes show normal landmarks. Palpation of the neck reveals midline trachea. The stridor is audible most right at the tracheostomy site.   PROCEDURE: Flexible fiberoptic laryngoscopy was performed through the right naris. The glottis and supraglottis are normal in appearance. Slight erythema posteriorly in the interarytenoid mucosa, but the vocal cords abduct and adduct normally. There is a visible whitish lesion, appears to be approximately 2 cm below the cords primarily on the right side. It does not appear to be mobile, but the patient is coughing and gagging during this examination. It appears to obstruct approximately 40% to 50% of his airway. I do not see an anterior shelf, but again he is coughing and gagging during this exam.   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 I explained the pathophysiology of this to the patient and his wife. He is most likely getting some inflammation in the subglottis and then presents to the Emergency Room. He responds to the steroids and the albuterol and the medications that they have given him, but the scar tissue or mature granulation tissue remains and needs to be addressed, possibly with some CO2 laser or possibly with jet ventilation and that would best be done by a dedicated fellowship-trained laryngologist, and I will arrange for outpatient evaluation by the laryngologist hopefully in the next few days. In the  meantime, I have recommended high-dose steroids, Decadron, while he is in the hospital the rest of this morning, and if he is feeling back to his baseline then, he can be discharged on a high-dose 60 mg taper of prednisone. I have also highly recommended humidification through a face bucket and  a humidifier at home and return to the Emergency Room if there is any worsening of his condition. Obviously, we could trach him again or intubate him if necessary, but I do not think that that is necessary certainly at this point in time, and he does respond to the steroids and with a steroid responsive lesion, I think it is reasonable to get him and hopefully get this definitively treated with laser subglottic stenosis treatment sometime next week.   ____________________________ J. Nadeen Landau, MD jmc:gb D: 12/26/2012 02:24:45 ET T: 12/26/2012 04:32:58 ET JOB#: [id number]  cc: Janalee Dane, MD, <Dictator> [place] MD ELECTRONICALLY SIGNED 01/14/2013 20:03

## 2021-08-23 ENCOUNTER — Other Ambulatory Visit: Payer: Self-pay

## 2021-08-23 ENCOUNTER — Ambulatory Visit (INDEPENDENT_AMBULATORY_CARE_PROVIDER_SITE_OTHER): Payer: Non-veteran care | Admitting: Podiatry

## 2021-08-23 ENCOUNTER — Encounter: Payer: Self-pay | Admitting: Podiatry

## 2021-08-23 DIAGNOSIS — M545 Low back pain, unspecified: Secondary | ICD-10-CM | POA: Insufficient documentation

## 2021-08-23 DIAGNOSIS — D2372 Other benign neoplasm of skin of left lower limb, including hip: Secondary | ICD-10-CM | POA: Diagnosis not present

## 2021-08-23 DIAGNOSIS — H47292 Other optic atrophy, left eye: Secondary | ICD-10-CM | POA: Insufficient documentation

## 2021-08-23 DIAGNOSIS — R7303 Prediabetes: Secondary | ICD-10-CM | POA: Insufficient documentation

## 2021-08-23 DIAGNOSIS — Z789 Other specified health status: Secondary | ICD-10-CM

## 2021-08-23 DIAGNOSIS — M722 Plantar fascial fibromatosis: Secondary | ICD-10-CM | POA: Insufficient documentation

## 2021-08-23 DIAGNOSIS — R059 Cough, unspecified: Secondary | ICD-10-CM

## 2021-08-23 DIAGNOSIS — J309 Allergic rhinitis, unspecified: Secondary | ICD-10-CM | POA: Insufficient documentation

## 2021-08-23 DIAGNOSIS — R0789 Other chest pain: Secondary | ICD-10-CM | POA: Insufficient documentation

## 2021-08-23 DIAGNOSIS — E1165 Type 2 diabetes mellitus with hyperglycemia: Secondary | ICD-10-CM | POA: Insufficient documentation

## 2021-08-23 DIAGNOSIS — J329 Chronic sinusitis, unspecified: Secondary | ICD-10-CM | POA: Insufficient documentation

## 2021-08-23 HISTORY — DX: Cough, unspecified: R05.9

## 2021-08-23 HISTORY — DX: Allergic rhinitis, unspecified: J30.9

## 2021-08-23 HISTORY — DX: Other specified health status: Z78.9

## 2021-08-23 NOTE — Progress Notes (Signed)
?Subjective:  ?Patient ID: Walter Frazier, male    DOB: [birth year],  MRN: [id number] ?HPI ?Chief Complaint  ?Patient presents with  ? Foot Pain  ?  Plantar forefoot left (sub 2nd/5th) - "plantar warts" refers to sub 2nd, tried OTC wart meds and trimming-no help  ? New Patient (Initial Visit)  ? ? ?66 y.o. male presents with the above complaint.  ? ?ROS: Denies fever chills nausea vomiting muscle aches pains calf pain back pain chest pain shortness of breath ? ?Past Medical History:  ?Diagnosis Date  ? Anoxic encephalopathy ([place])   ? a. s/p VT arrest 10/2012.  ? Coronary artery disease   ? a. VT Arrest/Ant STEMI- Cath/PCI: DES to LAD (no other details of cath/pci in Epic).  ? Dysphagia   ? a. 11/2012 s/p arrest with prolonged intubation and Trach.  ? Hyperlipidemia   ? Hypertension   ? Ischemic cardiomyopathy   ? a. 11/2012 Echo: Ef 25-30%, septal apical & ant HK. No apical thrombus.  ? Myocardial infarction [facility]) 10/2012  ? Seizure disorder ([place])   ? a. in setting of encephalopathy post VT arrest 11/2012.  ? Seizures ([place])   ? Shortness of breath 12/2012  ? Tobacco abuse   ? ?Past Surgical History:  ?Procedure Laterality Date  ? BACK SURGERY    ? ~20 years ago by Dr. Joya Salm  ? CARDIAC CATHETERIZATION    ? CORONARY ANGIOPLASTY WITH STENT PLACEMENT    ? TRACHEOSTOMY  10/2012  ? TRACHEOSTOMY CLOSURE  12/2012  ? ? ?Current Outpatient Medications:  ?  atorvastatin (LIPITOR) 80 MG tablet, TAKE ONE-HALF TABLET BY MOUTH AT BEDTIME FOR CHOLESTEROL, Disp: , Rfl:  ?  Cholecalciferol 25 MCG (1000 UT) tablet, Take 1 tablet by mouth daily., Disp: , Rfl:  ?  lisinopril (ZESTRIL) 10 MG tablet, TAKE ONE-HALF TABLET BY MOUTH EVERY DAY FOR BLOOD PRESSURE, Disp: , Rfl:  ?  albuterol (PROVENTIL HFA;VENTOLIN HFA) 108 (90 BASE) MCG/ACT inhaler, Inhale 2 puffs into the lungs every 6 (six) hours as needed for wheezing., Disp: 1 Inhaler, Rfl: 2 ?  aspirin 81 MG chewable tablet, Chew 1 tablet (81 mg total) by mouth daily., Disp: , Rfl:  ?   carvedilol (COREG) 6.25 MG tablet, Take 1 tablet (6.25 mg total) by mouth 2 (two) times daily with a meal., Disp: 60 tablet, Rfl: 1 ? ?Allergies  ?Allergen Reactions  ? Latex Rash  ? ?Review of Systems ?Objective:  ?There were no vitals filed for this visit. ? ?General: Well developed, nourished, in no acute distress, alert and oriented x3  ? ?Dermatological: Skin is warm, dry and supple bilateral. Nails x 10 are well maintained; remaining integument appears unremarkable at this time. There are no open sores, no preulcerative lesions, no rash or signs of infection present.  2 very hyperkeratotic preulcerative lesions 1 subsecond 1 some fifth of the left foot.  No erythema cellulitis drainage or odor once debrided did not demonstrate any ulcerative lesion. ? ?Vascular: Dorsalis Pedis artery and Posterior Tibial artery pedal pulses are 2/4 bilateral with immedate capillary fill time. Pedal hair growth present. No varicosities and no lower extremity edema present bilateral.  ? ?Neruologic: Grossly intact via light touch bilateral. Vibratory intact via tuning fork bilateral. Protective threshold with Semmes Wienstein monofilament intact to all pedal sites bilateral. Patellar and Achilles deep tendon reflexes 2+ bilateral. No Babinski or clonus noted bilateral.  ? ?Musculoskeletal: No gross boney pedal deformities bilateral. No pain, crepitus, or limitation noted with foot and  ankle range of motion bilateral. Muscular strength 5/5 in all groups tested bilateral. ? ?Gait: Unassisted, Nonantalgic.  ? ? ?Radiographs: ? ?None taken ? ?Assessment & Plan:  ? ?Assessment: Painful benign skin lesions forefoot left ? ?Plan: Debrided painful benign skin lesions x2 left foot. ? ? ? ? ?Stillman Buenger T. [place], DPM ?

## 2022-05-25 ENCOUNTER — Emergency Department
Admission: EM | Admit: 2022-05-25 | Discharge: 2022-05-25 | Disposition: A | Discharge status: Home / Self Care | Payer: No Typology Code available for payment source | Attending: Emergency Medicine | Admitting: Emergency Medicine

## 2022-05-25 ENCOUNTER — Emergency Department: Payer: No Typology Code available for payment source

## 2022-05-25 ENCOUNTER — Other Ambulatory Visit: Payer: Self-pay

## 2022-05-25 DIAGNOSIS — I2510 Atherosclerotic heart disease of native coronary artery without angina pectoris: Secondary | ICD-10-CM | POA: Diagnosis not present

## 2022-05-25 DIAGNOSIS — J40 Bronchitis, not specified as acute or chronic: Secondary | ICD-10-CM | POA: Insufficient documentation

## 2022-05-25 DIAGNOSIS — R062 Wheezing: Secondary | ICD-10-CM

## 2022-05-25 DIAGNOSIS — Z20822 Contact with and (suspected) exposure to covid-19: Secondary | ICD-10-CM | POA: Insufficient documentation

## 2022-05-25 DIAGNOSIS — R001 Bradycardia, unspecified: Secondary | ICD-10-CM | POA: Insufficient documentation

## 2022-05-25 DIAGNOSIS — Z87891 Personal history of nicotine dependence: Secondary | ICD-10-CM | POA: Insufficient documentation

## 2022-05-25 DIAGNOSIS — R0602 Shortness of breath: Secondary | ICD-10-CM | POA: Diagnosis present

## 2022-05-25 LAB — CBC WITH DIFFERENTIAL/PLATELET
Abs Immature Granulocytes: 0.03 10*3/uL (ref 0.00–0.07)
Basophils Absolute: 0.1 10*3/uL (ref 0.0–0.1)
Basophils Relative: 1 %
Eosinophils Absolute: 0.2 10*3/uL (ref 0.0–0.5)
Eosinophils Relative: 2 %
HCT: 43.5 % (ref 39.0–52.0)
Hemoglobin: 15.3 g/dL (ref 13.0–17.0)
Immature Granulocytes: 0 %
Lymphocytes Relative: 20 %
Lymphs Abs: 1.9 10*3/uL (ref 0.7–4.0)
MCH: 31.1 pg (ref 26.0–34.0)
MCHC: 35.2 g/dL (ref 30.0–36.0)
MCV: 88.4 fL (ref 80.0–100.0)
Monocytes Absolute: 0.8 10*3/uL (ref 0.1–1.0)
Monocytes Relative: 9 %
Neutro Abs: 6.3 10*3/uL (ref 1.7–7.7)
Neutrophils Relative %: 68 %
Platelets: 270 10*3/uL (ref 150–400)
RBC: 4.92 MIL/uL (ref 4.22–5.81)
RDW: 12.3 % (ref 11.5–15.5)
WBC: 9.3 10*3/uL (ref 4.0–10.5)
nRBC: 0 % (ref 0.0–0.2)

## 2022-05-25 LAB — RESP PANEL BY RT-PCR (RSV, FLU A&B, COVID)  RVPGX2
Influenza A by PCR: NEGATIVE
Influenza B by PCR: NEGATIVE
Resp Syncytial Virus by PCR: NEGATIVE
SARS Coronavirus 2 by RT PCR: NEGATIVE

## 2022-05-25 LAB — BASIC METABOLIC PANEL
Anion gap: 8 (ref 5–15)
BUN: 11 mg/dL (ref 8–23)
CO2: 21 mmol/L — ABNORMAL LOW (ref 22–32)
Calcium: 9.1 mg/dL (ref 8.9–10.3)
Chloride: 109 mmol/L (ref 98–111)
Creatinine, Ser: 0.92 mg/dL (ref 0.61–1.24)
GFR, Estimated: >60 mL/min (ref >60)
Glucose, Bld: 147 mg/dL — ABNORMAL HIGH (ref 70–99)
Potassium: 4 mmol/L (ref 3.5–5.1)
Sodium: 138 mmol/L (ref 135–145)

## 2022-05-25 LAB — TROPONIN I (HIGH SENSITIVITY): Troponin I (High Sensitivity): 8 ng/L (ref <18)

## 2022-05-25 MED ORDER — IPRATROPIUM-ALBUTEROL 0.5-2.5 (3) MG/3ML IN SOLN
6.0000 mL | Freq: Once | RESPIRATORY_TRACT | Status: AC
  Administered 2022-05-25: 6 mL via RESPIRATORY_TRACT
  Filled 2022-05-25: qty 3

## 2022-05-25 MED ORDER — ALBUTEROL SULFATE HFA 108 (90 BASE) MCG/ACT IN AERS: 2.0000 | INHALATION_SPRAY | RESPIRATORY_TRACT | Status: DC | PRN

## 2022-05-25 MED ORDER — ALBUTEROL SULFATE HFA 108 (90 BASE) MCG/ACT IN AERS: 2.0000 | INHALATION_SPRAY | Freq: Four times a day (QID) | RESPIRATORY_TRACT | 2 refills | Status: DC | PRN

## 2022-05-25 MED ORDER — PREDNISONE 20 MG PO TABS
60.0000 mg | ORAL_TABLET | Freq: Once | ORAL | Status: AC
  Administered 2022-05-25: 60 mg via ORAL
  Filled 2022-05-25: qty 3

## 2022-05-25 MED ORDER — PREDNISONE 50 MG PO TABS: ORAL_TABLET | ORAL | 0 refills | Status: DC

## 2022-05-25 NOTE — Discharge Instructions (Signed)
Take Prednisone as prescribed.  Use your albuterol inhaler 2 puffs every 4-6 hours as needed for shortness of breath and wheezing.  Thank you for choosing [place] for your health care today!  Please see your primary doctor this week for a follow up appointment.   Sometimes, in the early stages of certain disease courses it is difficult to detect in the emergency department evaluation -- so, it is important that you continue to monitor your symptoms and call your doctor right away or return to the emergency department if you develop any new or worsening symptoms.  Please go to the following website to schedule new (and existing) patient appointments:   [email or web address]  If you do not have a primary doctor try calling the following clinics to establish care:  If you have insurance:  [facility] [phone] [place] [address]   [facility]  [phone] [address] [place] [address]   If you do not have insurance:  [facility]  [phone] [address] [place] [address]   The following is another list of primary care offices in the area who are accepting new patients at this time.  Please reach out to one of them directly and let them know you would like to schedule an appointment to follow up on an Emergency Department visit, and/or to establish a new primary care provider (PCP).  There are likely other primary care clinics in the are who are accepting new patients, but this is an excellent place to start:  [place] physician: Dr Lavon Paganini [address] [place], [place] [address] [phone]  [facility] Lead Physician: Dr Steele Sizer [address], [place], [place] [address] [phone]  [place] Physician: Dr Park Liter [address] [place], [place] [address] [phone]  [facility] Lead Physician: Dr Dewaine Oats [place], [place], [place] [address] [phone]  [facility] Physician: Dr Halina Maidens [address] [place] Broach [place], [place] [address] [phone]   It was my pleasure to care for you today.   [place] Brunette Jacelyn Grip, MD

## 2022-05-25 NOTE — ED Provider Notes (Signed)
[facility] Provider Note    Event Date/Time   First MD Initiated Contact with Patient 05/25/22 1038     (approximate)   History   Shortness of Breath   HPI  Walter Frazier is a 66 y.o. male   Past medical history of CAD, prior tracheostomy, who presents with about 1 week of nasal congestion and productive cough.  No fevers or chills or shortness of breath.  No chest pain or abdominal pain.  No other acute medical problems.    He is a former smoker.  He has no formal diagnosis of COPD or asthma.  He does have an inhaler.  History was obtained via the patient. Reviewed external medical notes including discharge summary from 2014 when he had a STEMI and V-fib arrest, at that time medical history denoted COPD diagnosis.     Physical Exam   Triage Vital Signs: ED Triage Vitals  Enc Vitals Group     BP 05/25/22 1020 (!) 176/78     Pulse Rate 05/25/22 1020 62     Resp 05/25/22 1020 18     Temp 05/25/22 1020 97.9 F (36.6 C)     Temp Source 05/25/22 1020 Oral     SpO2 05/25/22 1020 96 %     Weight 05/25/22 1022 149 lb (67.6 kg)     Height 05/25/22 1022 '5\' 11"'$  (1.803 m)     Head Circumference --      Peak Flow --      Pain Score 05/25/22 1022 0     Pain Loc --      Pain Edu? --      Excl. in [place]? --     Most recent vital signs: Vitals:   05/25/22 1020 05/25/22 1100  BP: (!) 176/78 (!) 164/84  Pulse: 62 (!) 54  Resp: 18   Temp: 97.9 F (36.6 C) 98.8 F (37.1 C)  SpO2: 96%     General: Awake, no distress.  CV:  Good peripheral perfusion.  Resp:  Normal effort.  Abd:  No distention.  Other:  Wake alert oriented and pleasant.  Hypertensive 170s over 70.  Afebrile no hypoxemia on room air.  Breathing comfortably no respiratory distress.  He does have diffuse mild wheezing in all lung fields.  No focalities.  No rales.  He appears euvolemic.   ED Results / Procedures / Treatments   Labs (all labs ordered are listed, but only abnormal  results are displayed) Labs Reviewed  BASIC METABOLIC PANEL - Abnormal; Notable for the following components:      Result Value   CO2 21 (*)    Glucose, Bld 147 (*)    All other components within normal limits  RESP PANEL BY RT-PCR (RSV, FLU A&B, COVID)  [id number]  CBC WITH DIFFERENTIAL/PLATELET  TROPONIN I (HIGH SENSITIVITY)     I reviewed labs and they are notable for normal white blood cell count, troponin is 8.  EKG  ED ECG REPORT I, Lucillie Garfinkel, the attending physician, personally viewed and interpreted this ECG.   Date: 05/25/2022  EKG Time: 1027  Rate: 59  Rhythm: sinus bradycardia  Axis: nl  Intervals:none  ST&T Change: T wave inversions in V4 and 5 that are unchanged from prior.    RADIOLOGY I independently reviewed and interpreted this chest x-ray and see no obvious focalities or pneumothorax   PROCEDURES:  Critical Care performed: No  Procedures   MEDICATIONS ORDERED IN ED: Medications  albuterol (VENTOLIN  HFA) 108 (90 Base) MCG/ACT inhaler 2 puff (has no administration in time range)  ipratropium-albuterol (DUONEB) 0.5-2.5 (3) MG/3ML nebulizer solution 6 mL (6 mLs Nebulization Given 05/25/22 1145)  predniSONE (DELTASONE) tablet 60 mg (60 mg Oral Given 05/25/22 1145)    IMPRESSION / MDM / ASSESSMENT AND PLAN / ED COURSE  I reviewed the triage vital signs and the nursing notes.                              Differential diagnosis includes, but is not limited to, viral URI, bacterial pneumonia, sinus infection, ACS, PE, dissection, sepsis    The patient is on the cardiac monitor to evaluate for evidence of arrhythmia and/or significant heart rate changes.  MDM: Is a patient with viral URI symptoms and some wheezing on auscultation but appears otherwise well no respiratory distress and nontoxic.  Will check viral swabs, chest x-ray and basic labs.  I do not think he is having ACS PE or dissection.  I will treat his wheezing with DuoNebs and  steroids.   Workup largely unremarkable, viral swabs negative and CT of the chest shows no signs of bacterial pneumonia labs within normal limits and patient feels better with some treatment in the emergency department.  Will discharge with albuterol inhaler and steroid burst and follow-up with PMD and return if any worsening or new symptoms.  Patient's presentation is most consistent with acute presentation with potential threat to life or bodily function.       FINAL CLINICAL IMPRESSION(S) / ED DIAGNOSES   Final diagnoses:  Bronchitis  Wheezing     Rx / DC Orders   ED Discharge Orders          Ordered    predniSONE (DELTASONE) 50 MG tablet        05/25/22 1212    albuterol (VENTOLIN HFA) 108 (90 Base) MCG/ACT inhaler  Every 6 hours PRN        05/25/22 1212             Note:  This document was prepared using Dragon voice recognition software and may include unintentional dictation errors.    Lucillie Garfinkel, MD 05/25/22 1213

## 2022-05-25 NOTE — ED Triage Notes (Signed)
Reports sob started a week ago and has been dealing with sinus issues ever since having trach.  Reports productive cough with green sputum.  Denies fever. Denies CP.

## 2022-07-26 ENCOUNTER — Encounter: Payer: Self-pay | Admitting: Family

## 2022-07-26 ENCOUNTER — Ambulatory Visit (INDEPENDENT_AMBULATORY_CARE_PROVIDER_SITE_OTHER): Payer: Medicare HMO | Admitting: Family

## 2022-07-26 VITALS — BP 144/88 | HR 60 | Ht 71.0 in | Wt 189.0 lb

## 2022-07-26 DIAGNOSIS — K219 Gastro-esophageal reflux disease without esophagitis: Secondary | ICD-10-CM | POA: Diagnosis not present

## 2022-07-26 DIAGNOSIS — R55 Syncope and collapse: Secondary | ICD-10-CM | POA: Insufficient documentation

## 2022-07-26 DIAGNOSIS — E88819 Insulin resistance, unspecified: Secondary | ICD-10-CM

## 2022-07-26 DIAGNOSIS — M25532 Pain in left wrist: Secondary | ICD-10-CM | POA: Insufficient documentation

## 2022-07-26 DIAGNOSIS — I2510 Atherosclerotic heart disease of native coronary artery without angina pectoris: Secondary | ICD-10-CM

## 2022-07-27 ENCOUNTER — Encounter: Payer: Self-pay | Admitting: Family

## 2022-07-27 DIAGNOSIS — K219 Gastro-esophageal reflux disease without esophagitis: Secondary | ICD-10-CM | POA: Insufficient documentation

## 2022-07-27 NOTE — Progress Notes (Signed)
New Patient Office Visit  Subjective    Patient ID: Walter Frazier, male    DOB: Dec 02, 1955  Age: 67 y.o. MRN: [id number]  CC:  Chief Complaint  Patient presents with   Establish Care    New Patient    HPI Walter Frazier presents to establish care Previous Primary Care provider/office:   New Patient, establishing care.  Pt. Was previously seen for primary care in the [place] system, but would like to have someone local if he needs to see someone quickly rather than driving all the way to [facility].   He was recently seen there and had labs done. He did not need any thing in particular today, but says that she wanted him to come and have his A1C rechecked in a fe     Outpatient Encounter Medications as of 07/26/2022  Medication Sig   albuterol (PROVENTIL HFA;VENTOLIN HFA) 108 (90 BASE) MCG/ACT inhaler Inhale 2 puffs into the lungs every 6 (six) hours as needed for wheezing.   aspirin 81 MG chewable tablet Chew 1 tablet (81 mg total) by mouth daily.   atorvastatin (LIPITOR) 80 MG tablet TAKE ONE-HALF TABLET BY MOUTH AT BEDTIME FOR CHOLESTEROL   carvedilol (COREG) 25 MG tablet Take 25 mg by mouth 2 (two) times daily with a meal.   cetirizine (ZYRTEC) 10 MG tablet Take 1 tablet by mouth daily.   esomeprazole (NEXIUM) 40 MG capsule Take 40 mg by mouth daily at 12 noon.   fluticasone (FLONASE) 50 MCG/ACT nasal spray Place 2 sprays into both nostrils daily.   lisinopril (ZESTRIL) 10 MG tablet TAKE ONE-HALF TABLET BY MOUTH EVERY DAY FOR BLOOD PRESSURE   albuterol (VENTOLIN HFA) 108 (90 Base) MCG/ACT inhaler Inhale 2 puffs into the lungs every 6 (six) hours as needed for wheezing or shortness of breath.   Cholecalciferol 25 MCG (1000 UT) tablet Take 1 tablet by mouth daily.   [DISCONTINUED] carvedilol (COREG) 6.25 MG tablet Take 1 tablet (6.25 mg total) by mouth 2 (two) times daily with a meal.   [DISCONTINUED] predniSONE (DELTASONE) 50 MG tablet Take 1 tablet daily for the next 3 days  starting on 05/26/2022   No facility-administered encounter medications on file as of 07/26/2022.    Past Medical History:  Diagnosis Date   Acute respiratory failure with hypoxia ([place]) 10/31/2012   Allergic rhinitis 08/23/2021   Anoxic brain damage ([place]) 11/26/2012   Anoxic encephalopathy ([place])    a. s/p VT arrest 10/2012.   Coronary artery disease    a. VT Arrest/Ant STEMI- Cath/PCI: DES to LAD (no other details of cath/pci in Epic).   Cough, unspecified 08/23/2021   Dysphagia    a. 11/2012 s/p arrest with prolonged intubation and Trach.   Encephalopathy acute 10/31/2012   Encounter for screening for malignant neoplasm of respiratory organs 12/11/2012   History of MI (myocardial infarction) 12/26/2012   Hx of seizure disorder 12/26/2012   Formatting of this note might be different from the original.  Seizure d/o after hypothermia protocol   Hyperlipidemia    Hypertension    Inspiratory stridor 12/11/2012   Ischemic cardiomyopathy    a. 11/2012 Echo: Ef 25-30%, septal apical & ant HK. No apical thrombus.   Myocardial infarction [facility]) 10/02/2012   Other specified health status 08/23/2021   Seizure ([place]) 12/08/2012   Seizure disorder ([place])    a. in setting of encephalopathy post VT arrest 11/2012.   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 Joya Salm   CARDIAC CATHETERIZATION     CORONARY ANGIOPLASTY WITH STENT PLACEMENT     TRACHEOSTOMY  10/2012   TRACHEOSTOMY CLOSURE  12/2012    Family History  Problem Relation Age of Onset   Heart disease Father     Social History   Socioeconomic History   Marital status: Married    Spouse name: Not on file   Number of children: Not on file   Years of education: Not on file    Highest education level: Not on file  Occupational History   Not on file  Tobacco Use   Smoking status: Former    Packs/day: 1.00    Years: 30.00    Total pack years: 30.00    Types: Cigarettes    Quit date: 10/30/2012    Years since quitting: 9.7   Smokeless tobacco: Never  Substance and Sexual Activity   Alcohol use: Not Currently    Alcohol/week: 12.0 standard drinks of alcohol    Types: 12 drink(s) per week    Comment: used to drink ~ 6 pack beer/weekend   Drug use: No   Sexual activity: Not on file  Other Topics Concern   Not on file  Social History Narrative   Lives in [place] with his wife.  Previously a Physicist, medical in Psychologist, prison and probation services.  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   Social Determinants of Health   Financial Resource Strain: Not on file  Food Insecurity: Not on file  Transportation Needs: Not on file  Physical Activity: Not on file  Stress: Not on file  Social Connections: Not on file  Intimate Partner Violence: Not on file    Review of Systems  All other systems reviewed and are negative.       Objective    BP (!) 144/88   Pulse 60   Ht '5\' 11"'$  (1.803 m)   Wt 189 lb (85.7 kg)   SpO2 98%   BMI 26.36 kg/m   Physical Exam Vitals and nursing note reviewed.  Constitutional:      Appearance: Normal appearance. He is normal weight.  Eyes:     Pupils: Pupils are equal, round, and reactive to light.  Cardiovascular:     Rate and Rhythm: Normal rate and regular rhythm.     Pulses: Normal pulses.     Heart sounds: Normal heart sounds.  Pulmonary:     Effort: Pulmonary effort is normal.     Breath sounds: Normal breath sounds.  Neurological:     Mental Status: He is alert.  Psychiatric:        Mood and Affect: Mood normal.        Behavior: Behavior normal.        Assessment & Plan:   Problem List Items Addressed This Visit     CAD (coronary artery disease)   Relevant Medications   carvedilol  (COREG) 25 MG tablet   Insulin resistance - Primary   GERD without esophagitis   Relevant Medications   esomeprazole (NEXIUM) 40 MG capsule   Sending result of CT Scan to [facility] per pt request.  Will have him follow up in 6 weeks to check labs again for  follow up of A1C.    Return in about 6 weeks (around 09/03/2022).   Total time spent: 30 minutes  Mechele Claude, FNP  07/26/2022

## 2022-08-30 DIAGNOSIS — H401121 Primary open-angle glaucoma, left eye, mild stage: Secondary | ICD-10-CM | POA: Diagnosis not present

## 2022-08-30 DIAGNOSIS — H40023 Open angle with borderline findings, high risk, bilateral: Secondary | ICD-10-CM | POA: Diagnosis not present

## 2022-09-05 ENCOUNTER — Other Ambulatory Visit: Payer: Self-pay | Admitting: Family

## 2022-09-05 ENCOUNTER — Other Ambulatory Visit: Payer: Medicare HMO

## 2022-09-05 DIAGNOSIS — R7303 Prediabetes: Secondary | ICD-10-CM | POA: Diagnosis not present

## 2022-09-05 DIAGNOSIS — E782 Mixed hyperlipidemia: Secondary | ICD-10-CM | POA: Diagnosis not present

## 2022-09-05 DIAGNOSIS — R5383 Other fatigue: Secondary | ICD-10-CM

## 2022-09-05 DIAGNOSIS — I10 Essential (primary) hypertension: Secondary | ICD-10-CM | POA: Diagnosis not present

## 2022-09-05 DIAGNOSIS — E538 Deficiency of other specified B group vitamins: Secondary | ICD-10-CM | POA: Diagnosis not present

## 2022-09-05 DIAGNOSIS — E559 Vitamin D deficiency, unspecified: Secondary | ICD-10-CM | POA: Diagnosis not present

## 2022-09-06 ENCOUNTER — Ambulatory Visit (INDEPENDENT_AMBULATORY_CARE_PROVIDER_SITE_OTHER): Payer: Medicare HMO | Admitting: Family

## 2022-09-06 ENCOUNTER — Encounter: Payer: Self-pay | Admitting: Family

## 2022-09-06 VITALS — BP 130/80 | HR 75 | Ht 71.0 in | Wt 191.0 lb

## 2022-09-06 DIAGNOSIS — R7303 Prediabetes: Secondary | ICD-10-CM | POA: Diagnosis not present

## 2022-09-06 DIAGNOSIS — K219 Gastro-esophageal reflux disease without esophagitis: Secondary | ICD-10-CM | POA: Diagnosis not present

## 2022-09-06 DIAGNOSIS — E782 Mixed hyperlipidemia: Secondary | ICD-10-CM | POA: Diagnosis not present

## 2022-09-06 DIAGNOSIS — I10 Essential (primary) hypertension: Secondary | ICD-10-CM | POA: Diagnosis not present

## 2022-09-06 DIAGNOSIS — J301 Allergic rhinitis due to pollen: Secondary | ICD-10-CM | POA: Insufficient documentation

## 2022-09-06 LAB — CBC WITH DIFFERENTIAL
Basophils Absolute: 0.1 10*3/uL (ref 0.0–0.2)
Basos: 1 %
EOS (ABSOLUTE): 0.4 10*3/uL (ref 0.0–0.4)
Eos: 5 %
Hematocrit: 43.9 % (ref 37.5–51.0)
Hemoglobin: 15.3 g/dL (ref 13.0–17.7)
Immature Grans (Abs): 0 10*3/uL (ref 0.0–0.1)
Immature Granulocytes: 0 %
Lymphocytes Absolute: 1.5 10*3/uL (ref 0.7–3.1)
Lymphs: 19 %
MCH: 31.4 pg (ref 26.6–33.0)
MCHC: 34.9 g/dL (ref 31.5–35.7)
MCV: 90 fL (ref 79–97)
Monocytes Absolute: 0.7 10*3/uL (ref 0.1–0.9)
Monocytes: 9 %
Neutrophils Absolute: 5.1 10*3/uL (ref 1.4–7.0)
Neutrophils: 66 %
RBC: 4.87 x10E6/uL (ref 4.14–5.80)
RDW: 13 % (ref 11.6–15.4)
WBC: 7.7 10*3/uL (ref 3.4–10.8)

## 2022-09-06 LAB — CMP14+EGFR
ALT: 22 IU/L (ref 0–44)
AST: 17 IU/L (ref 0–40)
Albumin/Globulin Ratio: 1.9 (ref 1.2–2.2)
Albumin: 4.4 g/dL (ref 3.9–4.9)
Alkaline Phosphatase: 79 IU/L (ref 44–121)
BUN/Creatinine Ratio: 14 (ref 10–24)
BUN: 15 mg/dL (ref 8–27)
Bilirubin Total: 0.7 mg/dL (ref 0.0–1.2)
CO2: 22 mmol/L (ref 20–29)
Calcium: 9.3 mg/dL (ref 8.6–10.2)
Chloride: 103 mmol/L (ref 96–106)
Creatinine, Ser: 1.09 mg/dL (ref 0.76–1.27)
Globulin, Total: 2.3 g/dL (ref 1.5–4.5)
Glucose: 130 mg/dL — ABNORMAL HIGH (ref 70–99)
Potassium: 5.1 mmol/L (ref 3.5–5.2)
Sodium: 139 mmol/L (ref 134–144)
Total Protein: 6.7 g/dL (ref 6.0–8.5)
eGFR: 75 mL/min/{1.73_m2} (ref >59)

## 2022-09-06 LAB — LIPID PANEL
Chol/HDL Ratio: 2.6 ratio (ref 0.0–5.0)
Cholesterol, Total: 118 mg/dL (ref 100–199)
HDL: 45 mg/dL (ref >39)
LDL Chol Calc (NIH): 60 mg/dL (ref 0–99)
Triglycerides: 57 mg/dL (ref 0–149)
VLDL Cholesterol Cal: 13 mg/dL (ref 5–40)

## 2022-09-06 LAB — HEMOGLOBIN A1C
Est. average glucose Bld gHb Est-mCnc: 134 mg/dL
Hgb A1c MFr Bld: 6.3 % — ABNORMAL HIGH (ref 4.8–5.6)

## 2022-09-06 LAB — VITAMIN B12: Vitamin B-12: 677 pg/mL (ref 232–1245)

## 2022-09-06 LAB — TSH: TSH: 1.14 u[IU]/mL (ref 0.450–4.500)

## 2022-09-06 LAB — VITAMIN D 25 HYDROXY (VIT D DEFICIENCY, FRACTURES): Vit D, 25-Hydroxy: 40.8 ng/mL (ref 30.0–100.0)

## 2022-09-06 MED ORDER — CETIRIZINE HCL 10 MG PO TABS: 10.0000 mg | ORAL_TABLET | Freq: Every day | ORAL | 3 refills | Status: DC

## 2022-09-06 NOTE — Assessment & Plan Note (Signed)
Patient BP currently well controlled.  Continue current therapy.

## 2022-09-06 NOTE — Assessment & Plan Note (Signed)
A1C has come back down into prediabetic ranges after cessation of steroids.  Continue dietary changes and lifestyle modifications.

## 2022-09-06 NOTE — Assessment & Plan Note (Signed)
Continue pantoprazole.  Patient says his symptoms are well controlled on current regimen.

## 2022-09-06 NOTE — Progress Notes (Signed)
Established Patient Office Visit  Subjective:  Patient ID: Walter Frazier, male    DOB: 03-Nov-1955  Age: 67 y.o. MRN: [id number]  No chief complaint on file.   Patient is here today for his 6 weeks follow up.  He has been feeling well since last appointment.   He does not have additional concerns to discuss today.  Labs were drawn yesterday, so we will discuss these today. He needs refills.   I have reviewed his active problem list, medication list, allergies, family history, notes from last encounter, lab results for his appointment today.   No other concerns at this time.   Past Medical History:  Diagnosis Date   Acute respiratory failure with hypoxia 10/31/2012   Allergic rhinitis 08/23/2021   Anoxic brain damage 11/26/2012   Anoxic encephalopathy    a. s/p VT arrest 10/2012.   Coronary artery disease    a. VT Arrest/Ant STEMI- Cath/PCI: DES to LAD (no other details of cath/pci in Epic).   Cough, unspecified 08/23/2021   Dysphagia    a. 11/2012 s/p arrest with prolonged intubation and Trach.   Encephalopathy acute 10/31/2012   Encounter for screening for malignant neoplasm of respiratory organs 12/11/2012   History of MI (myocardial infarction) 12/26/2012   Hx of seizure disorder 12/26/2012   Formatting of this note might be different from the original.  Seizure d/o after hypothermia protocol   Hyperlipidemia    Hypertension    Inspiratory stridor 12/11/2012   Ischemic cardiomyopathy    a. 11/2012 Echo: Ef 25-30%, septal apical & ant HK. No apical thrombus.   Myocardial infarction 10/02/2012   Other specified health status 08/23/2021   Seizure 12/08/2012   Seizure disorder    a. in setting of encephalopathy post VT arrest 11/2012.   Seizures    Shortness of breath 12/02/2012   Status epilepticus 11/03/2012   Tobacco abuse    Tracheostomy status 11/14/2012   Troponin level elevated 12/26/2012   V-tach 10/31/2012    Past Surgical History:  Procedure  Laterality Date   BACK SURGERY     ~20 years ago by Dr. Joya Salm   CARDIAC CATHETERIZATION     CORONARY ANGIOPLASTY WITH STENT PLACEMENT     TRACHEOSTOMY  10/2012   TRACHEOSTOMY CLOSURE  12/2012    Social History   Socioeconomic History   Marital status: Married    Spouse name: Not on file   Number of children: Not on file   Years of education: Not on file   Highest education level: Not on file  Occupational History   Not on file  Tobacco Use   Smoking status: Former    Packs/day: 1.00    Years: 30.00    Additional pack years: 0.00    Total pack years: 30.00    Types: Cigarettes    Quit date: 10/30/2012    Years since quitting: 9.8   Smokeless tobacco: Never  Substance and Sexual Activity   Alcohol use: Not Currently    Alcohol/week: 12.0 standard drinks of alcohol    Types: 12 drink(s) per week    Comment: used to drink ~ 6 pack beer/weekend   Drug use: No   Sexual activity: Not on file  Other Topics Concern   Not on file  Social History Narrative   Lives in [place] with his wife.  Previously a Physicist, medical in Psychologist, prison and probation services.  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   Social Determinants of Engineer, drilling  Resource Strain: Not on file  Food Insecurity: Not on file  Transportation Needs: Not on file  Physical Activity: Not on file  Stress: Not on file  Social Connections: Not on file  Intimate Partner Violence: Not on file    Family History  Problem Relation Age of Onset   Heart disease Father     Allergies  Allergen Reactions   Latex Rash and Dermatitis    Review of Systems  All other systems reviewed and are negative.      Objective:   BP 130/80   Pulse 75   Ht 5\' 11"  (1.803 m)   Wt 191 lb (86.6 kg)   SpO2 97%   BMI 26.64 kg/m   Vitals:   09/06/22 0901  BP: 130/80  Pulse: 75  Height: 5\' 11"  (1.803 m)  Weight: 191 lb (86.6 kg)  SpO2: 97%  BMI (Calculated): 26.65    Physical Exam Vitals and  nursing note reviewed.  Constitutional:      Appearance: Normal appearance. He is normal weight.  Eyes:     Pupils: Pupils are equal, round, and reactive to light.  Cardiovascular:     Rate and Rhythm: Normal rate and regular rhythm.     Pulses: Normal pulses.     Heart sounds: Normal heart sounds.  Pulmonary:     Effort: Pulmonary effort is normal.     Breath sounds: Normal breath sounds.  Neurological:     General: No focal deficit present.     Mental Status: He is alert and oriented to person, place, and time.  Psychiatric:        Mood and Affect: Mood normal.        Behavior: Behavior normal.      No results found for any visits on 09/06/22.  Recent Results (from the past 2160 hour(s))  Lipid panel     Status: None   Collection Time: 09/05/22  3:19 PM  Result Value Ref Range   Cholesterol, Total 118 100 - 199 mg/dL   Triglycerides 57 0 - 149 mg/dL   HDL 45 >39 mg/dL   VLDL Cholesterol Cal 13 5 - 40 mg/dL   LDL Chol Calc (NIH) 60 0 - 99 mg/dL   Chol/HDL Ratio 2.6 0.0 - 5.0 ratio    Comment:                                   T. Chol/HDL Ratio                                             Men  Women                               1/2 Avg.Risk  3.4    3.3                                   Avg.Risk  5.0    4.4                                2X Avg.Risk  9.6    7.1  3X Avg.Risk 23.4   11.0   VITAMIN D 25 Hydroxy (Vit-D Deficiency, Fractures)     Status: None   Collection Time: 09/05/22  3:19 PM  Result Value Ref Range   Vit D, 25-Hydroxy 40.8 30.0 - 100.0 ng/mL    Comment: Vitamin D deficiency has been defined by the [place] practice guideline as a level of serum 25-OH vitamin D less than 20 ng/mL (1,2). The Endocrine Society went on to further define vitamin D insufficiency as a level between 21 and 29 ng/mL (2). 1. IOM (Institute of Medicine). 2010. Dietary reference    intakes for calcium and D.  [place]: The    [employer]. 2. Holick MF, Binkley [place], Bischoff-Ferrari HA, et al.    Evaluation, treatment, and prevention of vitamin D    deficiency: an Endocrine Society clinical practice    guideline. JCEM. 2011 Jul; 96(7):1911-30.   CBC With Differential     Status: None   Collection Time: 09/05/22  3:19 PM  Result Value Ref Range   WBC 7.7 3.4 - 10.8 x10E3/uL   RBC 4.87 4.14 - 5.80 x10E6/uL   Hemoglobin 15.3 13.0 - 17.7 g/dL   Hematocrit 43.9 37.5 - 51.0 %   MCV 90 79 - 97 fL   MCH 31.4 26.6 - 33.0 pg   MCHC 34.9 31.5 - 35.7 g/dL   RDW 13.0 11.6 - 15.4 %   Neutrophils 66 Not Estab. %   Lymphs 19 Not Estab. %   Monocytes 9 Not Estab. %   Eos 5 Not Estab. %   Basos 1 Not Estab. %   Neutrophils Absolute 5.1 1.4 - 7.0 x10E3/uL   Lymphocytes Absolute 1.5 0.7 - 3.1 x10E3/uL   Monocytes Absolute 0.7 0.1 - 0.9 x10E3/uL   EOS (ABSOLUTE) 0.4 0.0 - 0.4 x10E3/uL   Basophils Absolute 0.1 0.0 - 0.2 x10E3/uL   Immature Granulocytes 0 Not Estab. %   Immature Grans (Abs) 0.0 0.0 - 0.1 x10E3/uL  CMP14+EGFR     Status: Abnormal   Collection Time: 09/05/22  3:19 PM  Result Value Ref Range   Glucose 130 (H) 70 - 99 mg/dL   BUN 15 8 - 27 mg/dL   Creatinine, Ser 1.09 0.76 - 1.27 mg/dL   eGFR 75 >59 mL/min/1.73   BUN/Creatinine Ratio 14 10 - 24   Sodium 139 134 - 144 mmol/L   Potassium 5.1 3.5 - 5.2 mmol/L   Chloride 103 96 - 106 mmol/L   CO2 22 20 - 29 mmol/L   Calcium 9.3 8.6 - 10.2 mg/dL   Total Protein 6.7 6.0 - 8.5 g/dL   Albumin 4.4 3.9 - 4.9 g/dL   Globulin, Total 2.3 1.5 - 4.5 g/dL   Albumin/Globulin Ratio 1.9 1.2 - 2.2   Bilirubin Total 0.7 0.0 - 1.2 mg/dL   Alkaline Phosphatase 79 44 - 121 IU/L   AST 17 0 - 40 IU/L   ALT 22 0 - 44 IU/L  TSH     Status: None   Collection Time: 09/05/22  3:19 PM  Result Value Ref Range   TSH 1.140 0.450 - 4.500 uIU/mL  Hemoglobin A1c     Status: Abnormal   Collection Time: 09/05/22  3:19 PM  Result Value Ref Range    Hgb A1c MFr Bld 6.3 (H) 4.8 - 5.6 %    Comment:          Prediabetes: 5.7 - 6.4  Diabetes: >6.4          Glycemic control for adults with diabetes: <7.0    Est. average glucose Bld gHb Est-mCnc 134 mg/dL  Vitamin B12     Status: None   Collection Time: 09/05/22  3:19 PM  Result Value Ref Range   Vitamin B-12 677 232 - 1,245 pg/mL      Assessment & Plan:   Problem List Items Addressed This Visit     HTN (hypertension)    Patient BP currently well controlled.  Continue current therapy.        Hyperlipidemia   Prediabetes    A1C has come back down into prediabetic ranges after cessation of steroids.  Continue dietary changes and lifestyle modifications.       GERD without esophagitis    Continue pantoprazole.  Patient says his symptoms are well controlled on current regimen.        Seasonal allergic rhinitis due to pollen - Primary    Continue Cetirizine and Flonase Refill sent to [facility] for Cetirizine.  Pt. Will let me know when he needs refills on the flonase.        Relevant Medications   cetirizine (ZYRTEC ALLERGY) 10 MG tablet   Faxing copy of his labs to [facility] clinic   Return in about 6 months (around 03/08/2023).   Total time spent: 20 minutes  Mechele Claude, FNP  09/06/2022

## 2022-09-06 NOTE — Assessment & Plan Note (Signed)
Continue Cetirizine and Flonase Refill sent to [place] for Cetirizine.  Pt. Will let me know when he needs refills on the flonase.

## 2022-09-06 NOTE — Assessment & Plan Note (Signed)
>>  ASSESSMENT AND PLAN FOR PREDIABETES WRITTEN ON 09/06/2022 10:27 AM BY Nemesis Rain[place] M, FNP  A1C has come back down into prediabetic ranges after cessation of steroids.  Continue dietary changes and lifestyle modifications.

## 2022-10-06 DIAGNOSIS — I10 Essential (primary) hypertension: Secondary | ICD-10-CM | POA: Diagnosis not present

## 2022-10-06 DIAGNOSIS — Z7982 Long term (current) use of aspirin: Secondary | ICD-10-CM | POA: Diagnosis not present

## 2022-10-06 DIAGNOSIS — K219 Gastro-esophageal reflux disease without esophagitis: Secondary | ICD-10-CM | POA: Diagnosis not present

## 2022-10-06 DIAGNOSIS — J301 Allergic rhinitis due to pollen: Secondary | ICD-10-CM | POA: Diagnosis not present

## 2022-10-06 DIAGNOSIS — H409 Unspecified glaucoma: Secondary | ICD-10-CM | POA: Diagnosis not present

## 2022-10-06 DIAGNOSIS — E785 Hyperlipidemia, unspecified: Secondary | ICD-10-CM | POA: Diagnosis not present

## 2022-10-06 DIAGNOSIS — M1990 Unspecified osteoarthritis, unspecified site: Secondary | ICD-10-CM | POA: Diagnosis not present

## 2022-10-06 DIAGNOSIS — I252 Old myocardial infarction: Secondary | ICD-10-CM | POA: Diagnosis not present

## 2022-10-06 DIAGNOSIS — I2510 Atherosclerotic heart disease of native coronary artery without angina pectoris: Secondary | ICD-10-CM | POA: Diagnosis not present

## 2022-10-06 DIAGNOSIS — Z008 Encounter for other general examination: Secondary | ICD-10-CM | POA: Diagnosis not present

## 2022-10-11 DIAGNOSIS — H401121 Primary open-angle glaucoma, left eye, mild stage: Secondary | ICD-10-CM | POA: Diagnosis not present

## 2022-10-11 DIAGNOSIS — H40021 Open angle with borderline findings, high risk, right eye: Secondary | ICD-10-CM | POA: Diagnosis not present

## 2023-03-07 ENCOUNTER — Other Ambulatory Visit: Payer: Medicare HMO

## 2023-03-07 DIAGNOSIS — R7303 Prediabetes: Secondary | ICD-10-CM | POA: Diagnosis not present

## 2023-03-07 DIAGNOSIS — I10 Essential (primary) hypertension: Secondary | ICD-10-CM | POA: Diagnosis not present

## 2023-03-07 DIAGNOSIS — E538 Deficiency of other specified B group vitamins: Secondary | ICD-10-CM | POA: Diagnosis not present

## 2023-03-07 DIAGNOSIS — E039 Hypothyroidism, unspecified: Secondary | ICD-10-CM | POA: Diagnosis not present

## 2023-03-07 DIAGNOSIS — E782 Mixed hyperlipidemia: Secondary | ICD-10-CM

## 2023-03-08 ENCOUNTER — Other Ambulatory Visit: Payer: Self-pay | Admitting: Family

## 2023-03-08 ENCOUNTER — Encounter: Payer: Self-pay | Admitting: Family

## 2023-03-08 ENCOUNTER — Ambulatory Visit: Payer: Medicare HMO | Admitting: Family

## 2023-03-08 VITALS — BP 133/82 | HR 67 | Ht 71.0 in | Wt 183.6 lb

## 2023-03-08 DIAGNOSIS — E1165 Type 2 diabetes mellitus with hyperglycemia: Secondary | ICD-10-CM | POA: Diagnosis not present

## 2023-03-08 DIAGNOSIS — E538 Deficiency of other specified B group vitamins: Secondary | ICD-10-CM

## 2023-03-08 DIAGNOSIS — J301 Allergic rhinitis due to pollen: Secondary | ICD-10-CM

## 2023-03-08 DIAGNOSIS — J449 Chronic obstructive pulmonary disease, unspecified: Secondary | ICD-10-CM

## 2023-03-08 DIAGNOSIS — Z23 Encounter for immunization: Secondary | ICD-10-CM | POA: Diagnosis not present

## 2023-03-08 DIAGNOSIS — E559 Vitamin D deficiency, unspecified: Secondary | ICD-10-CM

## 2023-03-08 DIAGNOSIS — K219 Gastro-esophageal reflux disease without esophagitis: Secondary | ICD-10-CM | POA: Diagnosis not present

## 2023-03-08 LAB — CBC WITH DIFFERENTIAL/PLATELET
Basophils Absolute: 0.1 x10E3/uL (ref 0.0–0.2)
Basos: 1 %
EOS (ABSOLUTE): 0.3 x10E3/uL (ref 0.0–0.4)
Eos: 2 %
Hematocrit: 47.2 % (ref 37.5–51.0)
Hemoglobin: 15.5 g/dL (ref 13.0–17.7)
Immature Grans (Abs): 0 x10E3/uL (ref 0.0–0.1)
Immature Granulocytes: 0 %
Lymphocytes Absolute: 1.4 x10E3/uL (ref 0.7–3.1)
Lymphs: 13 %
MCH: 30.8 pg (ref 26.6–33.0)
MCHC: 32.8 g/dL (ref 31.5–35.7)
MCV: 94 fL (ref 79–97)
Monocytes Absolute: 0.7 x10E3/uL (ref 0.1–0.9)
Monocytes: 7 %
Neutrophils Absolute: 8.1 x10E3/uL — ABNORMAL HIGH (ref 1.4–7.0)
Neutrophils: 77 %
Platelets: 243 x10E3/uL (ref 150–450)
RBC: 5.03 x10E6/uL (ref 4.14–5.80)
RDW: 12.3 % (ref 11.6–15.4)
WBC: 10.5 x10E3/uL (ref 3.4–10.8)

## 2023-03-08 LAB — POC CREATINE & ALBUMIN,URINE
Albumin/Creatinine Ratio, Urine, POC: <30
Creatinine, POC: 10 mg/dL
Microalbumin Ur, POC: 10 mg/L

## 2023-03-08 LAB — VITAMIN B12: Vitamin B-12: 661 pg/mL (ref 232–1245)

## 2023-03-08 LAB — CMP14+EGFR
ALT: 22 [IU]/L (ref 0–44)
AST: 16 [IU]/L (ref 0–40)
Albumin: 4.3 g/dL (ref 3.9–4.9)
Alkaline Phosphatase: 79 [IU]/L (ref 44–121)
BUN/Creatinine Ratio: 9 — ABNORMAL LOW (ref 10–24)
BUN: 10 mg/dL (ref 8–27)
Bilirubin Total: 0.5 mg/dL (ref 0.0–1.2)
CO2: 24 mmol/L (ref 20–29)
Calcium: 9 mg/dL (ref 8.6–10.2)
Chloride: 105 mmol/L (ref 96–106)
Creatinine, Ser: 1.08 mg/dL (ref 0.76–1.27)
Globulin, Total: 2.3 g/dL (ref 1.5–4.5)
Glucose: 135 mg/dL — ABNORMAL HIGH (ref 70–99)
Potassium: 5.1 mmol/L (ref 3.5–5.2)
Sodium: 139 mmol/L (ref 134–144)
Total Protein: 6.6 g/dL (ref 6.0–8.5)
eGFR: 76 mL/min/{1.73_m2} (ref >59)

## 2023-03-08 LAB — LIPID PANEL
Chol/HDL Ratio: 2.7 {ratio} (ref 0.0–5.0)
Cholesterol, Total: 127 mg/dL (ref 100–199)
HDL: 47 mg/dL (ref >39)
LDL Chol Calc (NIH): 68 mg/dL (ref 0–99)
Triglycerides: 52 mg/dL (ref 0–149)
VLDL Cholesterol Cal: 12 mg/dL (ref 5–40)

## 2023-03-08 LAB — HEMOGLOBIN A1C
Est. average glucose Bld gHb Est-mCnc: 140 mg/dL
Hgb A1c MFr Bld: 6.5 % — ABNORMAL HIGH (ref 4.8–5.6)

## 2023-03-08 LAB — TSH: TSH: 1.06 u[IU]/mL (ref 0.450–4.500)

## 2023-03-08 NOTE — Progress Notes (Unsigned)
Established Patient Office Visit  Subjective:  Patient ID: Walter Frazier, male    DOB: Oct 04, 1955  Age: 67 y.o. MRN: [id number]  Chief Complaint  Patient presents with   Follow-up    Patient is here today for his 6 months follow up.  He has been feeling well since last appointment.   He does not have additional concerns to discuss today.  Labs were done a few days ago, so we will discuss these in detail today.  A1C is in diabetic ranges now, will continue watching this carefully.  Otherwise, labs look good.   He needs refills.  Also needs flu shot.  I have reviewed his active problem list, medication list, allergies, notes from last encounter, lab results for his appointment today.      No other concerns at this time.   Past Medical History:  Diagnosis Date   Acute respiratory failure with hypoxia (HCC) 10/31/2012   Allergic rhinitis 08/23/2021   Anoxic brain damage (HCC) 11/26/2012   Anoxic encephalopathy (HCC)    a. s/p VT arrest 10/2012.   Coronary artery disease    a. VT Arrest/Ant STEMI- Cath/PCI: DES to LAD (no other details of cath/pci in Epic).   Cough, unspecified 08/23/2021   Dysphagia    a. 11/2012 s/p arrest with prolonged intubation and Trach.   Encephalopathy acute 10/31/2012   Encounter for screening for malignant neoplasm of respiratory organs 12/11/2012   History of MI (myocardial infarction) 12/26/2012   Hx of seizure disorder 12/26/2012   Formatting of this note might be different from the original.  Seizure d/o after hypothermia protocol   Hyperlipidemia    Hypertension    Inspiratory stridor 12/11/2012   Ischemic cardiomyopathy    a. 11/2012 Echo: Ef 25-30%, septal apical & ant HK. No apical thrombus.   Myocardial infarction [facility]) 10/02/2012   Other specified health status 08/23/2021   Seizure (HCC) 12/08/2012   Seizure disorder (HCC)    a. in setting of encephalopathy post VT arrest 11/2012.   Seizures (HCC)    Shortness of breath  12/02/2012   Status epilepticus (HCC) 11/03/2012   Tobacco abuse    Tracheostomy status (HCC) 11/14/2012   Troponin level elevated 12/26/2012   V-tach (HCC) 10/31/2012    Past Surgical History:  Procedure Laterality Date   BACK SURGERY     ~20 years ago by Dr. Jeral Fruit   CARDIAC CATHETERIZATION     CORONARY ANGIOPLASTY WITH STENT PLACEMENT     TRACHEOSTOMY  10/2012   TRACHEOSTOMY CLOSURE  12/2012    Social History   Socioeconomic History   Marital status: Married    Spouse name: Not on file   Number of children: Not on file   Years of education: Not on file   Highest education level: Not on file  Occupational History   Not on file  Tobacco Use   Smoking status: Former    Current packs/day: 0.00    Average packs/day: 1 pack/day for 30.0 years (30.0 ttl pk-yrs)    Types: Cigarettes    Start date: 10/31/1982    Quit date: 10/30/2012    Years since quitting: 10.3   Smokeless tobacco: Never  Substance and Sexual Activity   Alcohol use: Not Currently    Alcohol/week: 12.0 standard drinks of alcohol    Types: 12 drink(s) per week    Comment: used to drink ~ 6 pack beer/weekend   Drug use: No   Sexual activity: Not on file  Other Topics  Concern   Not on file  Social History Narrative   Lives in [place] with his wife.  Previously a Writer in Geologist, engineering.  Most recently worked in Environmental manager.   Social Determinants of Health   Financial Resource Strain: Not on file  Food Insecurity: Not on file  Transportation Needs: Not on file  Physical Activity: Not on file  Stress: Not on file  Social Connections: Not on file  Intimate Partner Violence: Not on file    Family History  Problem Relation Age of Onset   Heart disease Father     Allergies  Allergen Reactions   Latex Rash and Dermatitis    Review of Systems  All other systems reviewed and are negative.      Objective:   BP 133/82   Pulse 67   Ht 5\' 11"  (1.803  m)   Wt 183 lb 9.6 oz (83.3 kg)   SpO2 98%   BMI 25.61 kg/m   Vitals:   03/08/23 0902  BP: 133/82  Pulse: 67  Height: 5\' 11"  (1.803 m)  Weight: 183 lb 9.6 oz (83.3 kg)  SpO2: 98%  BMI (Calculated): 25.62    Physical Exam Vitals and nursing note reviewed.  Constitutional:      Appearance: Normal appearance. He is normal weight.  Eyes:     Extraocular Movements: Extraocular movements intact.     Conjunctiva/sclera: Conjunctivae normal.     Pupils: Pupils are equal, round, and reactive to light.  Cardiovascular:     Rate and Rhythm: Normal rate and regular rhythm.     Pulses: Normal pulses.     Heart sounds: Normal heart sounds.  Pulmonary:     Effort: Pulmonary effort is normal.     Breath sounds: Normal breath sounds.  Neurological:     General: No focal deficit present.     Mental Status: He is alert and oriented to person, place, and time. Mental status is at baseline.  Psychiatric:        Mood and Affect: Mood normal.        Behavior: Behavior normal.        Thought Content: Thought content normal.        Judgment: Judgment normal.      Results for orders placed or performed in visit on 03/08/23  POC CREATINE & ALBUMIN,URINE  Result Value Ref Range   Microalbumin Ur, POC 10 mg/L   Creatinine, POC 10 mg/dL   Albumin/Creatinine Ratio, Urine, POC <30     Recent Results (from the past 2160 hour(s))  Lipid panel     Status: None   Collection Time: 03/07/23  8:43 AM  Result Value Ref Range   Cholesterol, Total 127 100 - 199 mg/dL   Triglycerides 52 0 - 149 mg/dL   HDL 47 >16 mg/dL   VLDL Cholesterol Cal 12 5 - 40 mg/dL   LDL Chol Calc (NIH) 68 0 - 99 mg/dL   Chol/HDL Ratio 2.7 0.0 - 5.0 ratio    Comment:                                   T. Chol/HDL Ratio  Men  Women                               1/2 Avg.Risk  3.4    3.3                                   Avg.Risk  5.0    4.4                                2X  Avg.Risk  9.6    7.1                                3X Avg.Risk 23.4   11.0   CBC with Differential/Platelet     Status: Abnormal   Collection Time: 03/07/23  8:43 AM  Result Value Ref Range   WBC 10.5 3.4 - 10.8 x10E3/uL   RBC 5.03 4.14 - 5.80 x10E6/uL   Hemoglobin 15.5 13.0 - 17.7 g/dL   Hematocrit 01.0 27.2 - 51.0 %   MCV 94 79 - 97 fL   MCH 30.8 26.6 - 33.0 pg   MCHC 32.8 31.5 - 35.7 g/dL   RDW 53.6 64.4 - 03.4 %   Platelets 243 150 - 450 x10E3/uL   Neutrophils 77 Not Estab. %   Lymphs 13 Not Estab. %   Monocytes 7 Not Estab. %   Eos 2 Not Estab. %   Basos 1 Not Estab. %   Neutrophils Absolute 8.1 (H) 1.4 - 7.0 x10E3/uL   Lymphocytes Absolute 1.4 0.7 - 3.1 x10E3/uL   Monocytes Absolute 0.7 0.1 - 0.9 x10E3/uL   EOS (ABSOLUTE) 0.3 0.0 - 0.4 x10E3/uL   Basophils Absolute 0.1 0.0 - 0.2 x10E3/uL   Immature Granulocytes 0 Not Estab. %   Immature Grans (Abs) 0.0 0.0 - 0.1 x10E3/uL  CMP14+EGFR     Status: Abnormal   Collection Time: 03/07/23  8:43 AM  Result Value Ref Range   Glucose 135 (H) 70 - 99 mg/dL   BUN 10 8 - 27 mg/dL   Creatinine, Ser 7.42 0.76 - 1.27 mg/dL   eGFR 76 >59 DG/LOV/5.64   BUN/Creatinine Ratio 9 (L) 10 - 24   Sodium 139 134 - 144 mmol/L   Potassium 5.1 3.5 - 5.2 mmol/L   Chloride 105 96 - 106 mmol/L   CO2 24 20 - 29 mmol/L   Calcium 9.0 8.6 - 10.2 mg/dL   Total Protein 6.6 6.0 - 8.5 g/dL   Albumin 4.3 3.9 - 4.9 g/dL   Globulin, Total 2.3 1.5 - 4.5 g/dL   Bilirubin Total 0.5 0.0 - 1.2 mg/dL   Alkaline Phosphatase 79 44 - 121 IU/L   AST 16 0 - 40 IU/L   ALT 22 0 - 44 IU/L  TSH     Status: None   Collection Time: 03/07/23  8:43 AM  Result Value Ref Range   TSH 1.060 0.450 - 4.500 uIU/mL  Hemoglobin A1c     Status: Abnormal   Collection Time: 03/07/23  8:43 AM  Result Value Ref Range   Hgb A1c MFr Bld 6.5 (H) 4.8 - 5.6 %    Comment:          Prediabetes: 5.7 - 6.4  Diabetes: >6.4          Glycemic control for adults with diabetes: <7.0     Est. average glucose Bld gHb Est-mCnc 140 mg/dL  Vitamin J81     Status: None   Collection Time: 03/07/23  8:43 AM  Result Value Ref Range   Vitamin B-12 661 232 - 1,245 pg/mL  POC CREATINE & ALBUMIN,URINE     Status: None   Collection Time: 03/08/23  1:08 PM  Result Value Ref Range   Microalbumin Ur, POC 10 mg/L   Creatinine, POC 10 mg/dL   Albumin/Creatinine Ratio, Urine, POC <30        Assessment & Plan:   Problem List Items Addressed This Visit       Active Problems   COPD exacerbation (HCC)   GERD without esophagitis   Seasonal allergic rhinitis due to pollen   Type 2 diabetes mellitus with hyperglycemia, without long-term current use of insulin (HCC)   Relevant Orders   POC CREATINE & ALBUMIN,URINE (Completed)   Other Visit Diagnoses     Flu vaccine need    -  Primary   Relevant Orders   Flu Vaccine Trivalent High Dose (Fluad) (Completed)   Vitamin D deficiency, unspecified       B12 deficiency due to diet           Return in about 6 months (around 09/06/2023).   Total time spent: {AMA time spent:29001} minutes  Miki Kins, FNP  03/08/2023   This document may have been prepared by [facility] Voice Recognition software and as such may include unintentional dictation errors.

## 2023-03-09 ENCOUNTER — Encounter: Payer: Self-pay | Admitting: Family

## 2023-03-09 NOTE — Assessment & Plan Note (Signed)
Patient stable.  Well controlled with current therapy.   Continue current meds.  

## 2023-03-09 NOTE — Assessment & Plan Note (Signed)
Blood pressure well controlled with current medications.  Continue current therapy.  Will reassess at follow up.  

## 2023-03-09 NOTE — Assessment & Plan Note (Signed)
Patient is newly diagnosed, blood sugars at the moment are still considered "well controlled" with diet. Will monitor going forward so we can ensure that this stays the case.   Recheck at follow up.

## 2023-04-16 DIAGNOSIS — H401121 Primary open-angle glaucoma, left eye, mild stage: Secondary | ICD-10-CM | POA: Diagnosis not present

## 2023-04-16 DIAGNOSIS — H40021 Open angle with borderline findings, high risk, right eye: Secondary | ICD-10-CM | POA: Diagnosis not present

## 2023-08-07 ENCOUNTER — Ambulatory Visit (INDEPENDENT_AMBULATORY_CARE_PROVIDER_SITE_OTHER): Payer: Self-pay | Admitting: Family

## 2023-08-07 ENCOUNTER — Encounter: Payer: Self-pay | Admitting: Family

## 2023-08-07 VITALS — BP 134/86 | HR 62 | Ht 71.0 in | Wt 188.4 lb

## 2023-08-07 DIAGNOSIS — R42 Dizziness and giddiness: Secondary | ICD-10-CM

## 2023-08-07 DIAGNOSIS — Z122 Encounter for screening for malignant neoplasm of respiratory organs: Secondary | ICD-10-CM | POA: Diagnosis not present

## 2023-08-07 DIAGNOSIS — J449 Chronic obstructive pulmonary disease, unspecified: Secondary | ICD-10-CM

## 2023-08-07 DIAGNOSIS — I255 Ischemic cardiomyopathy: Secondary | ICD-10-CM | POA: Diagnosis not present

## 2023-08-07 DIAGNOSIS — E1165 Type 2 diabetes mellitus with hyperglycemia: Secondary | ICD-10-CM

## 2023-08-07 DIAGNOSIS — Z0130 Encounter for examination of blood pressure without abnormal findings: Secondary | ICD-10-CM

## 2023-08-07 DIAGNOSIS — I252 Old myocardial infarction: Secondary | ICD-10-CM

## 2023-08-07 DIAGNOSIS — R55 Syncope and collapse: Secondary | ICD-10-CM

## 2023-08-07 NOTE — Progress Notes (Signed)
 Acute Office Visit  Subjective:     Patient ID: Walter Frazier, male    DOB: 08-24-1955, 68 y.o.   MRN: [id number]  Patient is in today for  Chief Complaint  Patient presents with   Dizziness    Had 2 episodes last week where he felt like he was going to pass out but he didn't.   BP is good, today, he has been taking it daily for the last 3 weeks, but he has been taking it at random times, has been taking Nyquil/Dayquil for a few days.  Gets out of breath really easily,   Dizziness This is a new problem. The current episode started in the past 7 days. The problem occurs intermittently. The problem has been waxing and waning. Associated symptoms include congestion, coughing and fatigue. Nothing aggravates the symptoms. He has tried lying down, position changes, rest, drinking and eating for the symptoms. The treatment provided no relief.     Review of Systems  Constitutional:  Positive for fatigue.  HENT:  Positive for congestion.   Respiratory:  Positive for cough.   Neurological:  Positive for dizziness.        Objective:    BP 134/86   Pulse 62   Ht 5\' 11"  (1.803 m)   Wt 188 lb 6.4 oz (85.5 kg)   SpO2 97%   BMI 26.28 kg/m   Physical Exam  No results found for any visits on 08/07/23.  Recent Results (from the past 2160 hours)  Hemoglobin A1c     Status: Abnormal   Collection Time: 09/05/23  8:46 AM  Result Value Ref Range   Hgb A1c MFr Bld 6.8 (H) 4.8 - 5.6 %    Comment:          Prediabetes: 5.7 - 6.4          Diabetes: >6.4          Glycemic control for adults with diabetes: <7.0    Est. average glucose Bld gHb Est-mCnc 148 mg/dL  TSH     Status: None   Collection Time: 09/05/23  8:46 AM  Result Value Ref Range   TSH 1.090 0.450 - 4.500 uIU/mL  CMP14+EGFR     Status: Abnormal   Collection Time: 09/05/23  8:46 AM  Result Value Ref Range   Glucose 131 (H) 70 - 99 mg/dL   BUN 13 8 - 27 mg/dL   Creatinine, Ser 1.19 0.76 - 1.27 mg/dL   eGFR 72 >14  NW/GNF/6.21   BUN/Creatinine Ratio 12 10 - 24   Sodium 141 134 - 144 mmol/L   Potassium 4.8 3.5 - 5.2 mmol/L   Chloride 104 96 - 106 mmol/L   CO2 22 20 - 29 mmol/L   Calcium  9.0 8.6 - 10.2 mg/dL   Total Protein 6.6 6.0 - 8.5 g/dL   Albumin 4.1 3.9 - 4.9 g/dL   Globulin, Total 2.5 1.5 - 4.5 g/dL   Bilirubin Total 0.6 0.0 - 1.2 mg/dL   Alkaline Phosphatase 86 44 - 121 IU/L   AST 18 0 - 40 IU/L   ALT 18 0 - 44 IU/L  Lipid panel     Status: None   Collection Time: 09/05/23  8:46 AM  Result Value Ref Range   Cholesterol, Total 127 100 - 199 mg/dL   Triglycerides 55 0 - 149 mg/dL   HDL 47 >30 mg/dL   VLDL Cholesterol Cal 12 5 - 40 mg/dL   LDL Chol Calc (NIH)  68 0 - 99 mg/dL   Chol/HDL Ratio 2.7 0.0 - 5.0 ratio    Comment:                                   T. Chol/HDL Ratio                                             Men  Women                               1/2 Avg.Risk  3.4    3.3                                   Avg.Risk  5.0    4.4                                2X Avg.Risk  9.6    7.1                                3X Avg.Risk 23.4   11.0   Vitamin D  (25 hydroxy)     Status: None   Collection Time: 09/05/23  8:46 AM  Result Value Ref Range   Vit D, 25-Hydroxy 38.6 30.0 - 100.0 ng/mL    Comment: Vitamin D  deficiency has been defined by the Institute of Medicine and an Endocrine Society practice guideline as a level of serum 25-OH vitamin D  less than 20 ng/mL (1,2). The Endocrine Society went on to further define vitamin D  insufficiency as a level between 21 and 29 ng/mL (2). 1. IOM (Institute of Medicine). 2010. Dietary reference    intakes for calcium  and D. [place]: The    [employer]. 2. Holick MF, Binkley [place], Bischoff-Ferrari HA, et al.    Evaluation, treatment, and prevention of vitamin D     deficiency: an Endocrine Society clinical practice    guideline. JCEM. 2011 Jul; 96(7):1911-30.   Vitamin B12     Status: None   Collection Time: 09/05/23   8:46 AM  Result Value Ref Range   Vitamin B-12 653 232 - 1,245 pg/mL    Allergies as of 08/07/2023       Reactions   Latex Rash, Dermatitis        Medication List        Accurate as of August 07, 2023 11:59 PM. If you have any questions, ask your nurse or doctor.          aspirin  81 MG chewable tablet Chew 1 tablet (81 mg total) by mouth daily.   atorvastatin  80 MG tablet Commonly known as: LIPITOR  TAKE ONE-HALF TABLET BY MOUTH AT BEDTIME FOR CHOLESTEROL   carvedilol  25 MG tablet Commonly known as: COREG  Take 25 mg by mouth 2 (two) times daily with a meal.   cetirizine  10 MG tablet Commonly known as: ZyrTEC  Allergy Take 1 tablet (10 mg total) by mouth daily.   Cholecalciferol 25 MCG (1000 UT) tablet Take 1 tablet by mouth daily.   esomeprazole 40 MG capsule Commonly known as:  NEXIUM Take 40 mg by mouth daily at 12 noon.   fluticasone 50 MCG/ACT nasal spray Commonly known as: FLONASE Place 2 sprays into both nostrils daily.   lisinopril  10 MG tablet Commonly known as: ZESTRIL  Take 10 mg by mouth daily.            Assessment & Plan:   Problem List Items Addressed This Visit       Cardiovascular and Mediastinum   Ischemic cardiomyopathy   Setting up referral to cardiology.  Will defer to them for further treatment decisions.      Relevant Orders   Ambulatory referral to Cardiology     Respiratory   COPD (chronic obstructive pulmonary disease) (HCC)   Patient stable.  Well controlled with current therapy.   Continue current meds.         Endocrine   Type 2 diabetes mellitus with hyperglycemia, without long-term current use of insulin  (HCC)   Checking labs today. Will call pt. With results  Continue current diabetes POC, as patient has been well controlled on current regimen.  Will adjust meds if needed based on labs.       Other Visit Diagnoses       Dizziness    -  Primary   EKG in office today.  Setting up referral to  Cardiology.   Relevant Orders   EKG 12-Lead     Screening for lung cancer       Setting up for Screening CT   Relevant Orders   CT CHEST LUNG CANCER SCREENING LOW DOSE WO CONTRAST (Completed)     Postural dizziness with presyncope       Relevant Orders   US  Carotid Bilateral (Completed)     History of ST elevation myocardial infarction (STEMI)       Setting pt. up with cardiology.   Relevant Orders   Ambulatory referral to Cardiology        Return in about 1 month (around 09/07/2023) for F/U.  Total time spent: 20 minutes  Trenda Frisk, FNP  08/07/2023   This document may have been prepared by [facility] Voice Recognition software and as such may include unintentional dictation errors.

## 2023-08-14 ENCOUNTER — Ambulatory Visit (INDEPENDENT_AMBULATORY_CARE_PROVIDER_SITE_OTHER)

## 2023-08-14 DIAGNOSIS — R42 Dizziness and giddiness: Secondary | ICD-10-CM

## 2023-08-14 DIAGNOSIS — R55 Syncope and collapse: Secondary | ICD-10-CM

## 2023-08-20 ENCOUNTER — Ambulatory Visit

## 2023-08-20 DIAGNOSIS — L139 Bullous disorder, unspecified: Secondary | ICD-10-CM

## 2023-08-20 DIAGNOSIS — R918 Other nonspecific abnormal finding of lung field: Secondary | ICD-10-CM

## 2023-08-20 DIAGNOSIS — F17211 Nicotine dependence, cigarettes, in remission: Secondary | ICD-10-CM

## 2023-08-20 DIAGNOSIS — Z122 Encounter for screening for malignant neoplasm of respiratory organs: Secondary | ICD-10-CM | POA: Diagnosis not present

## 2023-08-20 DIAGNOSIS — I700 Atherosclerosis of aorta: Secondary | ICD-10-CM

## 2023-08-20 DIAGNOSIS — Z720 Tobacco use: Secondary | ICD-10-CM

## 2023-08-20 DIAGNOSIS — Z87891 Personal history of nicotine dependence: Secondary | ICD-10-CM | POA: Diagnosis not present

## 2023-08-23 ENCOUNTER — Ambulatory Visit (INDEPENDENT_AMBULATORY_CARE_PROVIDER_SITE_OTHER): Admitting: Cardiovascular Disease

## 2023-08-23 ENCOUNTER — Encounter: Payer: Self-pay | Admitting: Cardiovascular Disease

## 2023-08-23 VITALS — BP 129/74 | HR 65 | Ht 71.0 in | Wt 189.0 lb

## 2023-08-23 DIAGNOSIS — I255 Ischemic cardiomyopathy: Secondary | ICD-10-CM

## 2023-08-23 DIAGNOSIS — R42 Dizziness and giddiness: Secondary | ICD-10-CM | POA: Diagnosis not present

## 2023-08-23 DIAGNOSIS — R55 Syncope and collapse: Secondary | ICD-10-CM

## 2023-08-23 DIAGNOSIS — I2510 Atherosclerotic heart disease of native coronary artery without angina pectoris: Secondary | ICD-10-CM | POA: Diagnosis not present

## 2023-08-23 DIAGNOSIS — I2102 ST elevation (STEMI) myocardial infarction involving left anterior descending coronary artery: Secondary | ICD-10-CM

## 2023-08-23 DIAGNOSIS — E782 Mixed hyperlipidemia: Secondary | ICD-10-CM

## 2023-08-23 DIAGNOSIS — I10 Essential (primary) hypertension: Secondary | ICD-10-CM

## 2023-08-23 DIAGNOSIS — R9431 Abnormal electrocardiogram [ECG] [EKG]: Secondary | ICD-10-CM

## 2023-08-23 DIAGNOSIS — R0789 Other chest pain: Secondary | ICD-10-CM | POA: Diagnosis not present

## 2023-08-23 NOTE — Progress Notes (Signed)
 Cardiology Office Note   Date:  08/23/2023   ID:  Walter, Frazier 12/19/1955, MRN [id number]  PCP:  Miki Kins, FNP  Cardiologist:  Adrian Blackwater, MD      History of Present Illness: Walter Frazier is a 68 y.o. male who presents for No chief complaint on file.   83 YOWM was in store and nearly passed out. Had caroted dopplers and EKG and was asked to consult.      Past Medical History:  Diagnosis Date   Acute respiratory failure with hypoxia (HCC) 10/31/2012   Allergic rhinitis 08/23/2021   Anoxic brain damage (HCC) 11/26/2012   Anoxic encephalopathy (HCC)    a. s/p VT arrest 10/2012.   Coronary artery disease    a. VT Arrest/Ant STEMI- Cath/PCI: DES to LAD (no other details of cath/pci in Epic).   Cough, unspecified 08/23/2021   Dysphagia    a. 11/2012 s/p arrest with prolonged intubation and Trach.   Encephalopathy acute 10/31/2012   Encounter for screening for malignant neoplasm of respiratory organs 12/11/2012   History of MI (myocardial infarction) 12/26/2012   Hx of seizure disorder 12/26/2012   Formatting of this note might be different from the original.  Seizure d/o after hypothermia protocol   Hyperlipidemia    Hypertension    Inspiratory stridor 12/11/2012   Ischemic cardiomyopathy    a. 11/2012 Echo: Ef 25-30%, septal apical & ant HK. No apical thrombus.   Myocardial infarction [facility]) 10/02/2012   Other specified health status 08/23/2021   Seizure (HCC) 12/08/2012   Seizure disorder (HCC)    a. in setting of encephalopathy post VT arrest 11/2012.   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 Jeral Fruit   CARDIAC CATHETERIZATION     CORONARY ANGIOPLASTY WITH STENT PLACEMENT      TRACHEOSTOMY  10/2012   TRACHEOSTOMY CLOSURE  12/2012     Current Outpatient Medications  Medication Sig Dispense Refill   aspirin 81 MG chewable tablet Chew 1 tablet (81 mg total) by mouth daily.     atorvastatin (LIPITOR) 80 MG tablet TAKE ONE-HALF TABLET BY MOUTH AT BEDTIME FOR CHOLESTEROL     carvedilol (COREG) 25 MG tablet Take 25 mg by mouth 2 (two) times daily with a meal.     cetirizine (ZYRTEC ALLERGY) 10 MG tablet Take 1 tablet (10 mg total) by mouth daily. 90 tablet 3   Cholecalciferol 25 MCG (1000 UT) tablet Take 1 tablet by mouth daily.     esomeprazole (NEXIUM) 40 MG capsule Take 40 mg by mouth daily at 12 noon.     fluticasone (FLONASE) 50 MCG/ACT nasal spray Place 2 sprays into both nostrils daily.     lisinopril (ZESTRIL) 10 MG tablet Take 10 mg by mouth daily.     No current facility-administered medications for this visit.    Allergies:   Latex    Social History:   reports that he quit smoking about 10 years ago. His smoking use included cigarettes. He started smoking about 40 years ago. He has a 30 pack-year smoking history. He has never used smokeless tobacco. He reports that he does not currently use alcohol after a  past usage of about 12.0 standard drinks of alcohol per week. He reports that he does not use drugs.   Family History:  family history includes Heart disease in his father.    ROS:     Review of Systems  Constitutional: Negative.   HENT: Negative.    Eyes: Negative.   Respiratory: Negative.    Gastrointestinal: Negative.   Genitourinary: Negative.   Musculoskeletal: Negative.   Skin: Negative.   Neurological: Negative.   Endo/Heme/Allergies: Negative.   Psychiatric/Behavioral: Negative.    All other systems reviewed and are negative.     All other systems are reviewed and negative.    PHYSICAL EXAM: VS:  BP 129/74   Pulse 65   Ht 5\' 11"  (1.803 m)   Wt 189 lb (85.7 kg)   SpO2 97%   BMI 26.36 kg/m  , BMI Body mass index is 26.36  kg/m. Last weight:  Wt Readings from Last 3 Encounters:  08/23/23 189 lb (85.7 kg)  08/07/23 188 lb 6.4 oz (85.5 kg)  03/08/23 183 lb 9.6 oz (83.3 kg)     Physical Exam Vitals reviewed.  Constitutional:      Appearance: Normal appearance. He is normal weight.  HENT:     Head: Normocephalic.     Nose: Nose normal.     Mouth/Throat:     Mouth: Mucous membranes are moist.  Eyes:     Pupils: Pupils are equal, round, and reactive to light.  Cardiovascular:     Rate and Rhythm: Normal rate and regular rhythm.     Pulses: Normal pulses.     Heart sounds: Normal heart sounds.  Pulmonary:     Effort: Pulmonary effort is normal.  Abdominal:     General: Abdomen is flat. Bowel sounds are normal.  Musculoskeletal:        General: Normal range of motion.     Cervical back: Normal range of motion.  Skin:    General: Skin is warm.  Neurological:     General: No focal deficit present.     Mental Status: He is alert.  Psychiatric:        Mood and Affect: Mood normal.       EKG:   Recent Labs: 03/07/2023: ALT 22; BUN 10; Creatinine, Ser 1.08; Hemoglobin 15.5; Platelets 243; Potassium 5.1; Sodium 139; TSH 1.060    Lipid Panel    Component Value Date/Time   CHOL 127 03/07/2023 0843   TRIG 52 03/07/2023 0843   HDL 47 03/07/2023 0843   CHOLHDL 2.7 03/07/2023 0843   LDLCALC 68 03/07/2023 0843      Other studies Reviewed: Additional studies/ records that were reviewed today include:  Review of the above records demonstrates:       No data to display            ASSESSMENT AND PLAN:    ICD-10-CM   1. Other chest pain  R07.89 PCV ECHOCARDIOGRAM COMPLETE    MYOCARDIAL PERFUSION IMAGING   Has reurrent chest pain, lost to f/u and has ischaemic st and t changes on ekg, advise echo, stress test.    2. Mixed hyperlipidemia  E78.2 PCV ECHOCARDIOGRAM COMPLETE    MYOCARDIAL PERFUSION IMAGING    3. Coronary artery disease involving native coronary artery of native heart,  unspecified whether angina present  I25.10 PCV ECHOCARDIOGRAM COMPLETE    MYOCARDIAL PERFUSION IMAGING    4. Dizziness  R42 PCV ECHOCARDIOGRAM COMPLETE    MYOCARDIAL PERFUSION IMAGING   caroted  doppler normal.    5. Abnormal EKG  R94.31 PCV ECHOCARDIOGRAM COMPLETE    MYOCARDIAL PERFUSION IMAGING   sinus bradycardia, with minimal st elevation anterior leads and T- wave inversions    6. ST elevation myocardial infarction involving left anterior descending (LAD) coronary artery (HCC)  I21.02 PCV ECHOCARDIOGRAM COMPLETE    MYOCARDIAL PERFUSION IMAGING    7. Primary hypertension  I10 PCV ECHOCARDIOGRAM COMPLETE    MYOCARDIAL PERFUSION IMAGING    8. Ischemic cardiomyopathy  I25.5 PCV ECHOCARDIOGRAM COMPLETE    MYOCARDIAL PERFUSION IMAGING    9. Syncope, unspecified syncope type  R55        Problem List Items Addressed This Visit       Cardiovascular and Mediastinum   STEMI (ST elevation myocardial infarction) (HCC)   Relevant Orders   PCV ECHOCARDIOGRAM COMPLETE   MYOCARDIAL PERFUSION IMAGING   CAD (coronary artery disease)   Relevant Orders   PCV ECHOCARDIOGRAM COMPLETE   MYOCARDIAL PERFUSION IMAGING   HTN (hypertension)   Relevant Orders   PCV ECHOCARDIOGRAM COMPLETE   MYOCARDIAL PERFUSION IMAGING   Ischemic cardiomyopathy   Relevant Orders   PCV ECHOCARDIOGRAM COMPLETE   MYOCARDIAL PERFUSION IMAGING     Other   Hyperlipidemia   Relevant Orders   PCV ECHOCARDIOGRAM COMPLETE   MYOCARDIAL PERFUSION IMAGING   Other Visit Diagnoses       Other chest pain    -  Primary   Has reurrent chest pain, lost to f/u and has ischaemic st and t changes on ekg, advise echo, stress test.   Relevant Orders   PCV ECHOCARDIOGRAM COMPLETE   MYOCARDIAL PERFUSION IMAGING     Dizziness       caroted doppler normal.   Relevant Orders   PCV ECHOCARDIOGRAM COMPLETE   MYOCARDIAL PERFUSION IMAGING     Abnormal EKG       sinus bradycardia, with minimal st elevation anterior leads and T-  wave inversions   Relevant Orders   PCV ECHOCARDIOGRAM COMPLETE   MYOCARDIAL PERFUSION IMAGING     Syncope, unspecified syncope type              Disposition:   Return in about 3 weeks (around 09/13/2023) for echo, stress test and f/u.    Total time spent: 50 minutes  Signed,  Adrian Blackwater, MD  08/23/2023 9:42 AM    [facility]

## 2023-09-05 ENCOUNTER — Other Ambulatory Visit

## 2023-09-05 DIAGNOSIS — I10 Essential (primary) hypertension: Secondary | ICD-10-CM

## 2023-09-05 DIAGNOSIS — E1165 Type 2 diabetes mellitus with hyperglycemia: Secondary | ICD-10-CM

## 2023-09-05 DIAGNOSIS — E559 Vitamin D deficiency, unspecified: Secondary | ICD-10-CM

## 2023-09-05 DIAGNOSIS — E782 Mixed hyperlipidemia: Secondary | ICD-10-CM

## 2023-09-05 DIAGNOSIS — E538 Deficiency of other specified B group vitamins: Secondary | ICD-10-CM

## 2023-09-06 ENCOUNTER — Encounter: Payer: Self-pay | Admitting: Family

## 2023-09-06 ENCOUNTER — Ambulatory Visit (INDEPENDENT_AMBULATORY_CARE_PROVIDER_SITE_OTHER): Payer: Medicare HMO | Admitting: Family

## 2023-09-06 VITALS — BP 118/80 | HR 78 | Ht 71.0 in | Wt 189.4 lb

## 2023-09-06 DIAGNOSIS — I10 Essential (primary) hypertension: Secondary | ICD-10-CM

## 2023-09-06 DIAGNOSIS — E1165 Type 2 diabetes mellitus with hyperglycemia: Secondary | ICD-10-CM

## 2023-09-06 DIAGNOSIS — J449 Chronic obstructive pulmonary disease, unspecified: Secondary | ICD-10-CM

## 2023-09-06 DIAGNOSIS — E782 Mixed hyperlipidemia: Secondary | ICD-10-CM | POA: Diagnosis not present

## 2023-09-06 DIAGNOSIS — J441 Chronic obstructive pulmonary disease with (acute) exacerbation: Secondary | ICD-10-CM

## 2023-09-06 LAB — VITAMIN B12: Vitamin B-12: 653 pg/mL (ref 232–1245)

## 2023-09-06 LAB — CMP14+EGFR
ALT: 18 IU/L (ref 0–44)
AST: 18 IU/L (ref 0–40)
Albumin: 4.1 g/dL (ref 3.9–4.9)
Alkaline Phosphatase: 86 IU/L (ref 44–121)
BUN/Creatinine Ratio: 12 (ref 10–24)
BUN: 13 mg/dL (ref 8–27)
Bilirubin Total: 0.6 mg/dL (ref 0.0–1.2)
CO2: 22 mmol/L (ref 20–29)
Calcium: 9 mg/dL (ref 8.6–10.2)
Chloride: 104 mmol/L (ref 96–106)
Creatinine, Ser: 1.13 mg/dL (ref 0.76–1.27)
Globulin, Total: 2.5 g/dL (ref 1.5–4.5)
Glucose: 131 mg/dL — ABNORMAL HIGH (ref 70–99)
Potassium: 4.8 mmol/L (ref 3.5–5.2)
Sodium: 141 mmol/L (ref 134–144)
Total Protein: 6.6 g/dL (ref 6.0–8.5)
eGFR: 72 mL/min/{1.73_m2} (ref >59)

## 2023-09-06 LAB — LIPID PANEL
Chol/HDL Ratio: 2.7 ratio (ref 0.0–5.0)
Cholesterol, Total: 127 mg/dL (ref 100–199)
HDL: 47 mg/dL (ref >39)
LDL Chol Calc (NIH): 68 mg/dL (ref 0–99)
Triglycerides: 55 mg/dL (ref 0–149)
VLDL Cholesterol Cal: 12 mg/dL (ref 5–40)

## 2023-09-06 LAB — HEMOGLOBIN A1C
Est. average glucose Bld gHb Est-mCnc: 148 mg/dL
Hgb A1c MFr Bld: 6.8 % — ABNORMAL HIGH (ref 4.8–5.6)

## 2023-09-06 LAB — TSH: TSH: 1.09 u[IU]/mL (ref 0.450–4.500)

## 2023-09-06 LAB — VITAMIN D 25 HYDROXY (VIT D DEFICIENCY, FRACTURES): Vit D, 25-Hydroxy: 38.6 ng/mL (ref 30.0–100.0)

## 2023-09-06 NOTE — Progress Notes (Signed)
 Established Patient Office Visit  Subjective:  Patient ID: Walter Frazier, male    DOB: [birth year]  Age: 68 y.o. MRN: [id number]  Chief Complaint  Patient presents with   Follow-up    6 month follow up    Patient is here today for his 6 months follow up.  He has been feeling fairly well since last appointment.   He does have additional concerns to discuss today.  He asks what he needs to do to control his sugars. Has appointments next week with cardiology.  Labs were done previously, and we will discuss these in detail today. He needs refills.   I have reviewed his active problem list, medication list, allergies, notes from last encounter, lab results for his appointment today.      No other concerns at this time.   Past Medical History:  Diagnosis Date   Acute respiratory failure with hypoxia (HCC) 10/31/2012   Allergic rhinitis 08/23/2021   Anoxic brain damage (HCC) 11/26/2012   Anoxic encephalopathy (HCC)    a. s/p VT arrest 10/2012.   Coronary artery disease    a. VT Arrest/Ant STEMI- Cath/PCI: DES to LAD (no other details of cath/pci in Epic).   Cough, unspecified 08/23/2021   Dysphagia    a. 11/2012 s/p arrest with prolonged intubation and Trach.   Encephalopathy acute 10/31/2012   Encounter for screening for malignant neoplasm of respiratory organs 12/11/2012   History of MI (myocardial infarction) 12/26/2012   Hx of seizure disorder 12/26/2012   Formatting of this note might be different from the original.  Seizure d/o after hypothermia protocol   Hyperlipidemia    Hypertension    Inspiratory stridor 12/11/2012   Ischemic cardiomyopathy    a. 11/2012 Echo: Ef 25-30%, septal apical & ant HK. No apical thrombus.   Myocardial infarction [facility]) 10/02/2012   Other specified health status 08/23/2021   Seizure (HCC) 12/08/2012   Seizure disorder (HCC)    a. in setting of encephalopathy post VT arrest 11/2012.   Seizures (HCC)    Shortness of breath 12/02/2012    Status epilepticus (HCC) 11/03/2012   Tobacco abuse    Tracheostomy status (HCC) 11/14/2012   Troponin level elevated 12/26/2012   V-tach (HCC) 10/31/2012    Past Surgical History:  Procedure Laterality Date   BACK SURGERY     ~20 years ago by Dr. Jeral Fruit   CARDIAC CATHETERIZATION     CORONARY ANGIOPLASTY WITH STENT PLACEMENT     TRACHEOSTOMY  10/2012   TRACHEOSTOMY CLOSURE  12/2012    Social History   Socioeconomic History   Marital status: Married    Spouse name: Not on file   Number of children: Not on file   Years of education: Not on file   Highest education level: Not on file  Occupational History   Not on file  Tobacco Use   Smoking status: Former    Current packs/day: 0.00    Average packs/day: 1 pack/day for 30.0 years (30.0 ttl pk-yrs)    Types: Cigarettes    Start date: 10/31/1982    Quit date: 10/30/2012    Years since quitting: 10.8   Smokeless tobacco: Never  Substance and Sexual Activity   Alcohol use: Not Currently    Alcohol/week: 12.0 standard drinks of alcohol    Types: 12 drink(s) per week    Comment: used to drink ~ 6 pack beer/weekend   Drug use: No   Sexual activity: Not on file  Other Topics Concern  Not on file  Social History Narrative   Lives in [place] with his wife.  Previously a Writer in Geologist, engineering.  Most recently worked in Environmental manager.   Social Drivers of Corporate investment banker Strain: Not on file  Food Insecurity: Not on file  Transportation Needs: Not on file  Physical Activity: Not on file  Stress: Not on file  Social Connections: Not on file  Intimate Partner Violence: Not on file    Family History  Problem Relation Age of Onset   Heart disease Father     Allergies  Allergen Reactions   Latex Rash and Dermatitis    Review of Systems  All other systems reviewed and are negative.      Objective:   BP 118/80   Pulse 78   Ht 5\' 11"  (1.803 m)   Wt 189 lb  6.4 oz (85.9 kg)   SpO2 96%   BMI 26.42 kg/m   Vitals:   09/06/23 0856  BP: 118/80  Pulse: 78  Height: 5\' 11"  (1.803 m)  Weight: 189 lb 6.4 oz (85.9 kg)  SpO2: 96%  BMI (Calculated): 26.43    Physical Exam Vitals and nursing note reviewed.  Constitutional:      Appearance: Normal appearance. He is normal weight.  Eyes:     Pupils: Pupils are equal, round, and reactive to light.  Cardiovascular:     Rate and Rhythm: Normal rate and regular rhythm.     Pulses: Normal pulses.     Heart sounds: Normal heart sounds.  Pulmonary:     Effort: Pulmonary effort is normal.     Breath sounds: Normal breath sounds.  Musculoskeletal:        General: Normal range of motion.     Cervical back: Normal range of motion.  Neurological:     General: No focal deficit present.     Mental Status: He is alert and oriented to person, place, and time. Mental status is at baseline.  Psychiatric:        Mood and Affect: Mood normal.        Behavior: Behavior normal.        Thought Content: Thought content normal.        Judgment: Judgment normal.      No results found for any visits on 09/06/23.  Recent Results (from the past 2160 hours)  Hemoglobin A1c     Status: Abnormal   Collection Time: 09/05/23  8:46 AM  Result Value Ref Range   Hgb A1c MFr Bld 6.8 (H) 4.8 - 5.6 %    Comment:          Prediabetes: 5.7 - 6.4          Diabetes: >6.4          Glycemic control for adults with diabetes: <7.0    Est. average glucose Bld gHb Est-mCnc 148 mg/dL  TSH     Status: None   Collection Time: 09/05/23  8:46 AM  Result Value Ref Range   TSH 1.090 0.450 - 4.500 uIU/mL  CMP14+EGFR     Status: Abnormal   Collection Time: 09/05/23  8:46 AM  Result Value Ref Range   Glucose 131 (H) 70 - 99 mg/dL   BUN 13 8 - 27 mg/dL   Creatinine, Ser 4.09 0.76 - 1.27 mg/dL   eGFR 72 >81 XB/JYN/8.29   BUN/Creatinine Ratio 12 10 - 24   Sodium 141 134 - 144 mmol/L  Potassium 4.8 3.5 - 5.2 mmol/L   Chloride  104 96 - 106 mmol/L   CO2 22 20 - 29 mmol/L   Calcium 9.0 8.6 - 10.2 mg/dL   Total Protein 6.6 6.0 - 8.5 g/dL   Albumin 4.1 3.9 - 4.9 g/dL   Globulin, Total 2.5 1.5 - 4.5 g/dL   Bilirubin Total 0.6 0.0 - 1.2 mg/dL   Alkaline Phosphatase 86 44 - 121 IU/L   AST 18 0 - 40 IU/L   ALT 18 0 - 44 IU/L  Lipid panel     Status: None   Collection Time: 09/05/23  8:46 AM  Result Value Ref Range   Cholesterol, Total 127 100 - 199 mg/dL   Triglycerides 55 0 - 149 mg/dL   HDL 47 >16 mg/dL   VLDL Cholesterol Cal 12 5 - 40 mg/dL   LDL Chol Calc (NIH) 68 0 - 99 mg/dL   Chol/HDL Ratio 2.7 0.0 - 5.0 ratio    Comment:                                   T. Chol/HDL Ratio                                             Men  Women                               1/2 Avg.Risk  3.4    3.3                                   Avg.Risk  5.0    4.4                                2X Avg.Risk  9.6    7.1                                3X Avg.Risk 23.4   11.0   Vitamin D (25 hydroxy)     Status: None   Collection Time: 09/05/23  8:46 AM  Result Value Ref Range   Vit D, 25-Hydroxy 38.6 30.0 - 100.0 ng/mL    Comment: Vitamin D deficiency has been defined by the Institute of Medicine and an Endocrine Society practice guideline as a level of serum 25-OH vitamin D less than 20 ng/mL (1,2). The Endocrine Society went on to further define vitamin D insufficiency as a level between 21 and 29 ng/mL (2). 1. IOM (Institute of Medicine). 2010. Dietary reference    intakes for calcium and D. [place]: The    [employer]. 2. Holick MF, Binkley [place], Bischoff-Ferrari HA, et al.    Evaluation, treatment, and prevention of vitamin D    deficiency: an Endocrine Society clinical practice    guideline. JCEM. 2011 Jul; 96(7):1911-30.   Vitamin B12     Status: None   Collection Time: 09/05/23  8:46 AM  Result Value Ref Range   Vitamin B-12 653 232 - 1,245 pg/mL       Assessment & Plan:  Problem List Items  Addressed This Visit       Cardiovascular and Mediastinum   HTN (hypertension)   Blood pressure well controlled with current medications.  Continue current therapy.  Will reassess at follow up.          Respiratory   COPD (chronic obstructive pulmonary disease) (HCC) - Primary   Patient stable.  Well controlled with current therapy.   Continue current meds.  e        Endocrine   Type 2 diabetes mellitus with hyperglycemia, without long-term current use of insulin (HCC)   Checking labs today. Will call pt. With results  Continue current diabetes POC, as patient has been well controlled on current regimen.  Will adjust meds if needed based on labs.           Other   Hyperlipidemia   Continue current therapy for lipid control. Will modify as needed based on labwork results.         Return in about 6 months (around 03/07/2024).   Total time spent: 20 minutes  Miki Kins, FNP  09/06/2023   This document may have been prepared by [facility] Voice Recognition software and as such may include unintentional dictation errors.

## 2023-09-08 ENCOUNTER — Encounter: Payer: Self-pay | Admitting: Family

## 2023-09-08 NOTE — Assessment & Plan Note (Signed)
 Blood pressure well controlled with current medications.  Continue current therapy.  Will reassess at follow up.

## 2023-09-08 NOTE — Assessment & Plan Note (Signed)
 Checking labs today. Will call pt. With results  Continue current diabetes POC, as patient has been well controlled on current regimen.  Will adjust meds if needed based on labs.

## 2023-09-08 NOTE — Assessment & Plan Note (Signed)
 Continue current therapy for lipid control. Will modify as needed based on labwork results.

## 2023-09-08 NOTE — Assessment & Plan Note (Signed)
 Patient stable.  Well controlled with current therapy.   Continue current meds.  e

## 2023-09-09 ENCOUNTER — Ambulatory Visit (INDEPENDENT_AMBULATORY_CARE_PROVIDER_SITE_OTHER)

## 2023-09-09 DIAGNOSIS — E782 Mixed hyperlipidemia: Secondary | ICD-10-CM

## 2023-09-09 DIAGNOSIS — I2102 ST elevation (STEMI) myocardial infarction involving left anterior descending coronary artery: Secondary | ICD-10-CM | POA: Diagnosis not present

## 2023-09-09 DIAGNOSIS — R0789 Other chest pain: Secondary | ICD-10-CM | POA: Diagnosis not present

## 2023-09-09 DIAGNOSIS — I255 Ischemic cardiomyopathy: Secondary | ICD-10-CM | POA: Diagnosis not present

## 2023-09-09 DIAGNOSIS — I10 Essential (primary) hypertension: Secondary | ICD-10-CM

## 2023-09-09 DIAGNOSIS — R42 Dizziness and giddiness: Secondary | ICD-10-CM

## 2023-09-09 DIAGNOSIS — R9431 Abnormal electrocardiogram [ECG] [EKG]: Secondary | ICD-10-CM

## 2023-09-09 DIAGNOSIS — I2510 Atherosclerotic heart disease of native coronary artery without angina pectoris: Secondary | ICD-10-CM | POA: Diagnosis not present

## 2023-09-11 ENCOUNTER — Ambulatory Visit

## 2023-09-11 DIAGNOSIS — I340 Nonrheumatic mitral (valve) insufficiency: Secondary | ICD-10-CM | POA: Diagnosis not present

## 2023-09-11 DIAGNOSIS — I10 Essential (primary) hypertension: Secondary | ICD-10-CM

## 2023-09-11 DIAGNOSIS — R42 Dizziness and giddiness: Secondary | ICD-10-CM

## 2023-09-11 DIAGNOSIS — E782 Mixed hyperlipidemia: Secondary | ICD-10-CM

## 2023-09-11 DIAGNOSIS — I255 Ischemic cardiomyopathy: Secondary | ICD-10-CM

## 2023-09-11 DIAGNOSIS — R0789 Other chest pain: Secondary | ICD-10-CM

## 2023-09-11 DIAGNOSIS — I2510 Atherosclerotic heart disease of native coronary artery without angina pectoris: Secondary | ICD-10-CM

## 2023-09-11 DIAGNOSIS — R9431 Abnormal electrocardiogram [ECG] [EKG]: Secondary | ICD-10-CM

## 2023-09-11 DIAGNOSIS — I2102 ST elevation (STEMI) myocardial infarction involving left anterior descending coronary artery: Secondary | ICD-10-CM

## 2023-09-13 ENCOUNTER — Encounter: Payer: Self-pay | Admitting: Cardiovascular Disease

## 2023-09-13 ENCOUNTER — Ambulatory Visit: Admitting: Cardiovascular Disease

## 2023-09-13 VITALS — BP 127/74 | HR 60 | Ht 71.0 in | Wt 187.0 lb

## 2023-09-13 DIAGNOSIS — R42 Dizziness and giddiness: Secondary | ICD-10-CM

## 2023-09-13 DIAGNOSIS — R9431 Abnormal electrocardiogram [ECG] [EKG]: Secondary | ICD-10-CM

## 2023-09-13 DIAGNOSIS — Z0130 Encounter for examination of blood pressure without abnormal findings: Secondary | ICD-10-CM

## 2023-09-13 DIAGNOSIS — E782 Mixed hyperlipidemia: Secondary | ICD-10-CM | POA: Diagnosis not present

## 2023-09-13 DIAGNOSIS — R0789 Other chest pain: Secondary | ICD-10-CM | POA: Diagnosis not present

## 2023-09-13 DIAGNOSIS — I2510 Atherosclerotic heart disease of native coronary artery without angina pectoris: Secondary | ICD-10-CM

## 2023-09-13 DIAGNOSIS — I255 Ischemic cardiomyopathy: Secondary | ICD-10-CM

## 2023-09-13 DIAGNOSIS — I2102 ST elevation (STEMI) myocardial infarction involving left anterior descending coronary artery: Secondary | ICD-10-CM

## 2023-09-13 MED ORDER — DAPAGLIFLOZIN PROPANEDIOL 10 MG PO TABS
10.0000 mg | ORAL_TABLET | Freq: Every day | ORAL | 3 refills | Status: DC
Start: 2023-09-13 — End: 2023-09-24

## 2023-09-13 MED ORDER — ENTRESTO 24-26 MG PO TABS: 1.0000 | ORAL_TABLET | Freq: Two times a day (BID) | ORAL | 2 refills | Status: AC

## 2023-09-13 NOTE — Progress Notes (Signed)
 Cardiology Office Note   Date:  09/13/2023   ID:  Walter Frazier, DOB 02-May-1956, MRN [id number]  PCP:  Miki Kins, FNP  Cardiologist:  Adrian Blackwater, MD      History of Present Illness: Walter Frazier is a 68 y.o. male who presents for  Chief Complaint  Patient presents with   Follow-up    ECHO NST results    Doing well      Past Medical History:  Diagnosis Date   Acute respiratory failure with hypoxia (HCC) 10/31/2012   Allergic rhinitis 08/23/2021   Anoxic brain damage (HCC) 11/26/2012   Anoxic encephalopathy (HCC)    a. s/p VT arrest 10/2012.   Coronary artery disease    a. VT Arrest/Ant STEMI- Cath/PCI: DES to LAD (no other details of cath/pci in Epic).   Cough, unspecified 08/23/2021   Dysphagia    a. 11/2012 s/p arrest with prolonged intubation and Trach.   Encephalopathy acute 10/31/2012   Encounter for screening for malignant neoplasm of respiratory organs 12/11/2012   History of MI (myocardial infarction) 12/26/2012   Hx of seizure disorder 12/26/2012   Formatting of this note might be different from the original.  Seizure d/o after hypothermia protocol   Hyperlipidemia    Hypertension    Inspiratory stridor 12/11/2012   Ischemic cardiomyopathy    a. 11/2012 Echo: Ef 25-30%, septal apical & ant HK. No apical thrombus.   Myocardial infarction [facility]) 10/02/2012   Other specified health status 08/23/2021   Seizure (HCC) 12/08/2012   Seizure disorder (HCC)    a. in setting of encephalopathy post VT arrest 11/2012.   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 Jeral Fruit   CARDIAC CATHETERIZATION     CORONARY ANGIOPLASTY WITH STENT PLACEMENT     TRACHEOSTOMY  10/2012   TRACHEOSTOMY  CLOSURE  12/2012     Current Outpatient Medications  Medication Sig Dispense Refill   dapagliflozin propanediol (FARXIGA) 10 MG TABS tablet Take 1 tablet (10 mg total) by mouth daily before breakfast. 30 tablet 3   sacubitril-valsartan (ENTRESTO) 24-26 MG Take 1 tablet by mouth 2 (two) times daily. 60 tablet 2   aspirin 81 MG chewable tablet Chew 1 tablet (81 mg total) by mouth daily.     atorvastatin (LIPITOR) 80 MG tablet TAKE ONE-HALF TABLET BY MOUTH AT BEDTIME FOR CHOLESTEROL     carvedilol (COREG) 25 MG tablet Take 25 mg by mouth 2 (two) times daily with a meal.     cetirizine (ZYRTEC ALLERGY) 10 MG tablet Take 1 tablet (10 mg total) by mouth daily. 90 tablet 3   Cholecalciferol 25 MCG (1000 UT) tablet Take 1 tablet by mouth daily.     esomeprazole (NEXIUM) 40 MG capsule Take 40 mg by mouth daily at 12 noon.     fluticasone (FLONASE) 50 MCG/ACT nasal spray Place 2 sprays into both nostrils daily.     No current facility-administered medications for this visit.    Allergies:   Latex    Social History:   reports that he quit smoking about 10 years ago. His smoking use included cigarettes. He started smoking about 40 years ago. He has a  30 pack-year smoking history. He has never used smokeless tobacco. He reports that he does not currently use alcohol after a past usage of about 12.0 standard drinks of alcohol per week. He reports that he does not use drugs.   Family History:  family history includes Heart disease in his father.    ROS:     Review of Systems  Constitutional: Negative.   HENT: Negative.    Eyes: Negative.   Respiratory: Negative.    Gastrointestinal: Negative.   Genitourinary: Negative.   Musculoskeletal: Negative.   Skin: Negative.   Neurological: Negative.   Endo/Heme/Allergies: Negative.   Psychiatric/Behavioral: Negative.    All other systems reviewed and are negative.     All other systems are reviewed and negative.    PHYSICAL EXAM: VS:  BP  127/74   Pulse 60   Ht 5\' 11"  (1.803 m)   Wt 187 lb (84.8 kg)   SpO2 98%   BMI 26.08 kg/m  , BMI Body mass index is 26.08 kg/m. Last weight:  Wt Readings from Last 3 Encounters:  09/13/23 187 lb (84.8 kg)  09/06/23 189 lb 6.4 oz (85.9 kg)  08/23/23 189 lb (85.7 kg)     Physical Exam Vitals reviewed.  Constitutional:      Appearance: Normal appearance. He is normal weight.  HENT:     Head: Normocephalic.     Nose: Nose normal.     Mouth/Throat:     Mouth: Mucous membranes are moist.  Eyes:     Pupils: Pupils are equal, round, and reactive to light.  Cardiovascular:     Rate and Rhythm: Normal rate and regular rhythm.     Pulses: Normal pulses.     Heart sounds: Normal heart sounds.  Pulmonary:     Effort: Pulmonary effort is normal.  Abdominal:     General: Abdomen is flat. Bowel sounds are normal.  Musculoskeletal:        General: Normal range of motion.     Cervical back: Normal range of motion.  Skin:    General: Skin is warm.  Neurological:     General: No focal deficit present.     Mental Status: He is alert.  Psychiatric:        Mood and Affect: Mood normal.       EKG:   Recent Labs: 03/07/2023: Hemoglobin 15.5; Platelets 243 09/05/2023: ALT 18; BUN 13; Creatinine, Ser 1.13; Potassium 4.8; Sodium 141; TSH 1.090    Lipid Panel    Component Value Date/Time   CHOL 127 09/05/2023 0846   TRIG 55 09/05/2023 0846   HDL 47 09/05/2023 0846   CHOLHDL 2.7 09/05/2023 0846   LDLCALC 68 09/05/2023 0846      Other studies Reviewed: Additional studies/ records that were reviewed today include:  Review of the above records demonstrates:       No data to display            ASSESSMENT AND PLAN:    ICD-10-CM   1. Mixed hyperlipidemia  E78.2 sacubitril-valsartan (ENTRESTO) 24-26 MG    dapagliflozin propanediol (FARXIGA) 10 MG TABS tablet    2. Coronary artery disease involving native coronary artery of native heart, unspecified whether angina present   I25.10 sacubitril-valsartan (ENTRESTO) 24-26 MG    dapagliflozin propanediol (FARXIGA) 10 MG TABS tablet    3. Abnormal EKG  R94.31 sacubitril-valsartan (ENTRESTO) 24-26 MG    dapagliflozin propanediol (FARXIGA) 10 MG TABS tablet    4. Dizziness  R42  sacubitril-valsartan (ENTRESTO) 24-26 MG    dapagliflozin propanediol (FARXIGA) 10 MG TABS tablet    5. ST elevation myocardial infarction involving left anterior descending (LAD) coronary artery (HCC)  I21.02 sacubitril-valsartan (ENTRESTO) 24-26 MG    dapagliflozin propanediol (FARXIGA) 10 MG TABS tablet    6. Other chest pain  R07.89 sacubitril-valsartan (ENTRESTO) 24-26 MG    dapagliflozin propanediol (FARXIGA) 10 MG TABS tablet   Stress test showed inferolateral and apical fixed defect, LVEF 375, no ischaemia, just severe LV dysfunction.    7. Ischemic cardiomyopathy  I25.5    LVEF 37, onecho, stress test no ischaemia. Start farxiga and entresto, stop lisinopril.       Problem List Items Addressed This Visit       Cardiovascular and Mediastinum   STEMI (ST elevation myocardial infarction) (HCC)   Relevant Medications   sacubitril-valsartan (ENTRESTO) 24-26 MG   dapagliflozin propanediol (FARXIGA) 10 MG TABS tablet   CAD (coronary artery disease)   Relevant Medications   sacubitril-valsartan (ENTRESTO) 24-26 MG   dapagliflozin propanediol (FARXIGA) 10 MG TABS tablet   Ischemic cardiomyopathy   Relevant Medications   sacubitril-valsartan (ENTRESTO) 24-26 MG     Other   Hyperlipidemia - Primary   Relevant Medications   sacubitril-valsartan (ENTRESTO) 24-26 MG   dapagliflozin propanediol (FARXIGA) 10 MG TABS tablet   Other Visit Diagnoses       Abnormal EKG       Relevant Medications   sacubitril-valsartan (ENTRESTO) 24-26 MG   dapagliflozin propanediol (FARXIGA) 10 MG TABS tablet     Dizziness       Relevant Medications   sacubitril-valsartan (ENTRESTO) 24-26 MG   dapagliflozin propanediol (FARXIGA) 10 MG TABS  tablet     Other chest pain       Stress test showed inferolateral and apical fixed defect, LVEF 375, no ischaemia, just severe LV dysfunction.   Relevant Medications   sacubitril-valsartan (ENTRESTO) 24-26 MG   dapagliflozin propanediol (FARXIGA) 10 MG TABS tablet          Disposition:   Return in about 4 weeks (around 10/11/2023).    Total time spent: 35 minutes  Signed,  Adrian Blackwater, MD  09/13/2023 11:21 AM    [facility]

## 2023-09-23 ENCOUNTER — Telehealth: Payer: Self-pay

## 2023-09-23 NOTE — Telephone Encounter (Signed)
 [facility] called stating that [facility] can't get Farxiga  but they can get the Jardiance  and he wanted to know if you could change him to that one

## 2023-09-24 ENCOUNTER — Other Ambulatory Visit: Payer: Self-pay

## 2023-09-24 MED ORDER — EMPAGLIFLOZIN 25 MG PO TABS: 25.0000 mg | ORAL_TABLET | Freq: Every day | ORAL | 11 refills | Status: DC

## 2023-09-24 MED ORDER — EMPAGLIFLOZIN 25 MG PO TABS
25.0000 mg | ORAL_TABLET | Freq: Every day | ORAL | 11 refills | Status: AC
Start: 2023-09-24 — End: ?

## 2023-10-20 ENCOUNTER — Encounter: Payer: Self-pay | Admitting: Family

## 2023-10-20 NOTE — Assessment & Plan Note (Signed)
 Setting up referral to cardiology.  Will defer to them for further treatment decisions.

## 2023-10-20 NOTE — Assessment & Plan Note (Signed)
 Checking labs today. Will call pt. With results  Continue current diabetes POC, as patient has been well controlled on current regimen.  Will adjust meds if needed based on labs.

## 2023-10-20 NOTE — Assessment & Plan Note (Signed)
 Patient stable.  Well controlled with current therapy.   Continue current meds.

## 2023-10-21 ENCOUNTER — Encounter: Payer: Self-pay | Admitting: Cardiovascular Disease

## 2023-10-21 ENCOUNTER — Ambulatory Visit (INDEPENDENT_AMBULATORY_CARE_PROVIDER_SITE_OTHER): Admitting: Cardiovascular Disease

## 2023-10-21 VITALS — BP 138/66 | HR 67 | Ht 71.0 in | Wt 178.0 lb

## 2023-10-21 DIAGNOSIS — I255 Ischemic cardiomyopathy: Secondary | ICD-10-CM

## 2023-10-21 DIAGNOSIS — I10 Essential (primary) hypertension: Secondary | ICD-10-CM | POA: Diagnosis not present

## 2023-10-21 DIAGNOSIS — E782 Mixed hyperlipidemia: Secondary | ICD-10-CM

## 2023-10-21 DIAGNOSIS — I2510 Atherosclerotic heart disease of native coronary artery without angina pectoris: Secondary | ICD-10-CM | POA: Diagnosis not present

## 2023-10-21 DIAGNOSIS — I2102 ST elevation (STEMI) myocardial infarction involving left anterior descending coronary artery: Secondary | ICD-10-CM

## 2023-10-21 NOTE — Progress Notes (Signed)
 Cardiology Office Note   Date:  10/21/2023   ID:  Walter Frazier, Walter Frazier 03-11-1956, MRN [id number]  PCP:  Trenda Frisk, FNP  Cardiologist:  Debborah Fairly, MD      History of Present Illness: Walter Frazier is a 68 y.o. male who presents for  Chief Complaint  Patient presents with   Follow-up    1 Month Follow Up    On jaurdiance instead of [facility]iga  [facility], less SOB, more energy.      Past Medical History:  Diagnosis Date   Acute respiratory failure with hypoxia (HCC) 10/31/2012   Allergic rhinitis 08/23/2021   Anoxic brain damage (HCC) 11/26/2012   Anoxic encephalopathy (HCC)    a. s/p VT arrest 10/2012.   Coronary artery disease    a. VT Arrest/Ant STEMI- Cath/PCI: DES to LAD (no other details of cath/pci in Epic).   Cough, unspecified 08/23/2021   Dysphagia    a. 11/2012 s/p arrest with prolonged intubation and Trach.   Encephalopathy acute 10/31/2012   Encounter for screening for malignant neoplasm of respiratory organs 12/11/2012   History of MI (myocardial infarction) 12/26/2012   Hx of seizure disorder 12/26/2012   Formatting of this note might be different from the original.  Seizure d/o after hypothermia protocol   Hyperlipidemia    Hypertension    Inspiratory stridor 12/11/2012   Ischemic cardiomyopathy    a. 11/2012 Echo: Ef 25-30%, septal apical & ant HK. No apical thrombus.   Myocardial infarction [facility]) 10/02/2012   Other specified health status 08/23/2021   Seizure (HCC) 12/08/2012   Seizure disorder (HCC)    a. in setting of encephalopathy post VT arrest 11/2012.   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 Rica Chalet   CARDIAC CATHETERIZATION     CORONARY ANGIOPLASTY WITH STENT  PLACEMENT     TRACHEOSTOMY  10/2012   TRACHEOSTOMY CLOSURE  12/2012     Current Outpatient Medications  Medication Sig Dispense Refill   aspirin  81 MG chewable tablet Chew 1 tablet (81 mg total) by mouth daily.     atorvastatin  (LIPITOR ) 80 MG tablet TAKE ONE-HALF TABLET BY MOUTH AT BEDTIME FOR CHOLESTEROL     carvedilol  (COREG ) 25 MG tablet Take 25 mg by mouth 2 (two) times daily with a meal.     cetirizine  (ZYRTEC  ALLERGY) 10 MG tablet Take 1 tablet (10 mg total) by mouth daily. 90 tablet 3   Cholecalciferol 25 MCG (1000 UT) tablet Take 1 tablet by mouth daily.     empagliflozin  (JARDIANCE ) 25 MG TABS tablet Take 1 tablet (25 mg total) by mouth daily before breakfast. 30 tablet 11   esomeprazole (NEXIUM) 40 MG capsule Take 40 mg by mouth daily at 12 noon.     fluticasone (FLONASE) 50 MCG/ACT nasal spray Place 2 sprays into both nostrils daily.     sacubitril-valsartan (ENTRESTO ) 24-26 MG Take 1 tablet by mouth 2 (two) times daily. 60 tablet 2   No current facility-administered medications for this visit.    Allergies:   Latex    Social History:   reports that he quit smoking about 10 years ago. His smoking use included cigarettes. He  started smoking about 41 years ago. He has a 30 pack-year smoking history. He has never used smokeless tobacco. He reports that he does not currently use alcohol after a past usage of about 12.0 standard drinks of alcohol per week. He reports that he does not use drugs.   Family History:  family history includes Heart disease in his father.    ROS:     Review of Systems  Constitutional: Negative.   HENT: Negative.    Eyes: Negative.   Respiratory: Negative.    Gastrointestinal: Negative.   Genitourinary: Negative.   Musculoskeletal: Negative.   Skin: Negative.   Neurological: Negative.   Endo/Heme/Allergies: Negative.   Psychiatric/Behavioral: Negative.    All other systems reviewed and are negative.     All other systems are reviewed and  negative.    PHYSICAL EXAM: VS:  BP 138/66   Pulse 67   Ht 5\' 11"  (1.803 m)   Wt 178 lb (80.7 kg)   SpO2 96%   BMI 24.83 kg/m  , BMI Body mass index is 24.83 kg/m. Last weight:  Wt Readings from Last 3 Encounters:  10/21/23 178 lb (80.7 kg)  09/13/23 187 lb (84.8 kg)  09/06/23 189 lb 6.4 oz (85.9 kg)     Physical Exam Vitals reviewed.  Constitutional:      Appearance: Normal appearance. He is normal weight.  HENT:     Head: Normocephalic.     Nose: Nose normal.     Mouth/Throat:     Mouth: Mucous membranes are moist.  Eyes:     Pupils: Pupils are equal, round, and reactive to light.  Cardiovascular:     Rate and Rhythm: Normal rate and regular rhythm.     Pulses: Normal pulses.     Heart sounds: Normal heart sounds.  Pulmonary:     Effort: Pulmonary effort is normal.  Abdominal:     General: Abdomen is flat. Bowel sounds are normal.  Musculoskeletal:        General: Normal range of motion.     Cervical back: Normal range of motion.  Skin:    General: Skin is warm.  Neurological:     General: No focal deficit present.     Mental Status: He is alert.  Psychiatric:        Mood and Affect: Mood normal.       EKG:   Recent Labs: 03/07/2023: Hemoglobin 15.5; Platelets 243 09/05/2023: ALT 18; BUN 13; Creatinine, Ser 1.13; Potassium 4.8; Sodium 141; TSH 1.090    Lipid Panel    Component Value Date/Time   CHOL 127 09/05/2023 0846   TRIG 55 09/05/2023 0846   HDL 47 09/05/2023 0846   CHOLHDL 2.7 09/05/2023 0846   LDLCALC 68 09/05/2023 0846      Other studies Reviewed: Additional studies/ records that were reviewed today include:  Review of the above records demonstrates:       No data to display            ASSESSMENT AND PLAN:    ICD-10-CM   1. Ischemic cardiomyopathy  I25.5    Feeling much better after starting jaurdiance. dixzzines and SOB gone. ECHO has EF 40-45 now.    2. Coronary artery disease involving native coronary artery of native  heart, unspecified whether angina present  I25.10     3. Mixed hyperlipidemia  E78.2     4. ST elevation myocardial infarction involving left anterior descending (LAD) coronary artery (HCC)  I21.02  5. Primary hypertension  I10        Problem List Items Addressed This Visit       Cardiovascular and Mediastinum   STEMI (ST elevation myocardial infarction) (HCC)   CAD (coronary artery disease)   HTN (hypertension)   Ischemic cardiomyopathy - Primary     Other   Hyperlipidemia       Disposition:   Return in about 2 months (around 12/21/2023).    Total time spent: 30 minutes  Signed,  Debborah Fairly, MD  10/21/2023 9:21 AM    [facility]

## 2023-12-24 ENCOUNTER — Ambulatory Visit: Admitting: Cardiovascular Disease

## 2023-12-24 ENCOUNTER — Encounter: Payer: Self-pay | Admitting: Cardiovascular Disease

## 2023-12-24 VITALS — BP 119/81 | HR 66 | Ht 71.0 in | Wt 173.6 lb

## 2023-12-24 DIAGNOSIS — R0789 Other chest pain: Secondary | ICD-10-CM

## 2023-12-24 DIAGNOSIS — I2102 ST elevation (STEMI) myocardial infarction involving left anterior descending coronary artery: Secondary | ICD-10-CM

## 2023-12-24 DIAGNOSIS — Z720 Tobacco use: Secondary | ICD-10-CM

## 2023-12-24 DIAGNOSIS — I255 Ischemic cardiomyopathy: Secondary | ICD-10-CM | POA: Diagnosis not present

## 2023-12-24 DIAGNOSIS — I10 Essential (primary) hypertension: Secondary | ICD-10-CM

## 2023-12-24 DIAGNOSIS — I2510 Atherosclerotic heart disease of native coronary artery without angina pectoris: Secondary | ICD-10-CM

## 2023-12-24 DIAGNOSIS — Z0130 Encounter for examination of blood pressure without abnormal findings: Secondary | ICD-10-CM

## 2023-12-24 DIAGNOSIS — I5041 Acute combined systolic (congestive) and diastolic (congestive) heart failure: Secondary | ICD-10-CM

## 2023-12-24 DIAGNOSIS — E782 Mixed hyperlipidemia: Secondary | ICD-10-CM

## 2023-12-24 NOTE — Progress Notes (Signed)
 Cardiology Office Note   Date:  12/24/2023   ID:  Frazier, Walter 06/16/1955, MRN [id number]  PCP:  Orlean Alan HERO, FNP  Cardiologist:  Denyse Bathe, MD      History of Present Illness: Walter Frazier is a 68 y.o. male who presents for  Chief Complaint  Patient presents with   Follow-up    Feeling better after being treated.      Past Medical History:  Diagnosis Date   Acute respiratory failure with hypoxia (HCC) 10/31/2012   Allergic rhinitis 08/23/2021   Anoxic brain damage (HCC) 11/26/2012   Anoxic encephalopathy (HCC)    a. s/p VT arrest 10/2012.   Coronary artery disease    a. VT Arrest/Ant STEMI- Cath/PCI: DES to LAD (no other details of cath/pci in Epic).   Cough, unspecified 08/23/2021   Dysphagia    a. 11/2012 s/p arrest with prolonged intubation and Trach.   Encephalopathy acute 10/31/2012   Encounter for screening for malignant neoplasm of respiratory organs 12/11/2012   History of MI (myocardial infarction) 12/26/2012   Hx of seizure disorder 12/26/2012   Formatting of this note might be different from the original.  Seizure d/o after hypothermia protocol   Hyperlipidemia    Hypertension    Inspiratory stridor 12/11/2012   Ischemic cardiomyopathy    a. 11/2012 Echo: Ef 25-30%, septal apical & ant HK. No apical thrombus.   Myocardial infarction [facility]) 10/02/2012   Other specified health status 08/23/2021   Seizure (HCC) 12/08/2012   Seizure disorder (HCC)    a. in setting of encephalopathy post VT arrest 11/2012.   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 Leeann   CARDIAC CATHETERIZATION     CORONARY ANGIOPLASTY WITH STENT PLACEMENT     TRACHEOSTOMY  10/2012   TRACHEOSTOMY  CLOSURE  12/2012     Current Outpatient Medications  Medication Sig Dispense Refill   aspirin  81 MG chewable tablet Chew 1 tablet (81 mg total) by mouth daily.     atorvastatin  (LIPITOR ) 80 MG tablet TAKE ONE-HALF TABLET BY MOUTH AT BEDTIME FOR CHOLESTEROL     carvedilol  (COREG ) 25 MG tablet Take 25 mg by mouth 2 (two) times daily with a meal.     Cholecalciferol 25 MCG (1000 UT) tablet Take 1 tablet by mouth daily.     empagliflozin  (JARDIANCE ) 25 MG TABS tablet Take 1 tablet (25 mg total) by mouth daily before breakfast. 30 tablet 11   esomeprazole (NEXIUM) 40 MG capsule Take 40 mg by mouth daily at 12 noon.     sacubitril-valsartan (ENTRESTO ) 24-26 MG Take 1 tablet by mouth 2 (two) times daily. 60 tablet 2   No current facility-administered medications for this visit.    Allergies:   Latex    Social History:   reports that he quit smoking about 11 years ago. His smoking use included cigarettes. He started smoking about 41 years ago. He has a 30 pack-year smoking history. He has never used smokeless tobacco. He reports that he does not currently use alcohol after a past usage of about 12.0 standard drinks of alcohol per week. He reports that he does not use drugs.  Family History:  family history includes Heart disease in his father.    ROS:     Review of Systems  Constitutional: Negative.   HENT: Negative.    Eyes: Negative.   Respiratory: Negative.    Gastrointestinal: Negative.   Genitourinary: Negative.   Musculoskeletal: Negative.   Skin: Negative.   Neurological: Negative.   Endo/Heme/Allergies: Negative.   Psychiatric/Behavioral: Negative.    All other systems reviewed and are negative.     All other systems are reviewed and negative.    PHYSICAL EXAM: VS:  BP 119/81   Pulse 66   Ht 5' 11 (1.803 m)   Wt 173 lb 9.6 oz (78.7 kg)   SpO2 97%   BMI 24.21 kg/m  , BMI Body mass index is 24.21 kg/m. Last weight:  Wt Readings from Last 3 Encounters:  12/24/23  173 lb 9.6 oz (78.7 kg)  10/21/23 178 lb (80.7 kg)  09/13/23 187 lb (84.8 kg)     Physical Exam Vitals reviewed.  Constitutional:      Appearance: Normal appearance. He is normal weight.  HENT:     Head: Normocephalic.     Nose: Nose normal.     Mouth/Throat:     Mouth: Mucous membranes are moist.  Eyes:     Pupils: Pupils are equal, round, and reactive to light.  Cardiovascular:     Rate and Rhythm: Normal rate and regular rhythm.     Pulses: Normal pulses.     Heart sounds: Normal heart sounds.  Pulmonary:     Effort: Pulmonary effort is normal.  Abdominal:     General: Abdomen is flat. Bowel sounds are normal.  Musculoskeletal:        General: Normal range of motion.     Cervical back: Normal range of motion.  Skin:    General: Skin is warm.  Neurological:     General: No focal deficit present.     Mental Status: He is alert.  Psychiatric:        Mood and Affect: Mood normal.       EKG:   Recent Labs: 03/07/2023: Hemoglobin 15.5; Platelets 243 09/05/2023: ALT 18; BUN 13; Creatinine, Ser 1.13; Potassium 4.8; Sodium 141; TSH 1.090    Lipid Panel    Component Value Date/Time   CHOL 127 09/05/2023 0846   TRIG 55 09/05/2023 0846   HDL 47 09/05/2023 0846   CHOLHDL 2.7 09/05/2023 0846   LDLCALC 68 09/05/2023 0846      Other studies Reviewed: Additional studies/ records that were reviewed today include:  Review of the above records demonstrates:       No data to display            ASSESSMENT AND PLAN:    ICD-10-CM   1. CHF (congestive heart failure), NYHA class I, acute, combined (HCC)  I50.41    improved  with no SOB after GDMT.    2. Coronary artery disease involving native coronary artery of native heart, unspecified whether angina present  I25.10     3. Primary hypertension  I10     4. Ischemic cardiomyopathy  I25.5    LVEF 40%.    5. ST elevation myocardial infarction involving left anterior descending (LAD) coronary artery (HCC)  I21.02      6. Atypical chest pain  R07.89     7. Mixed hyperlipidemia  E78.2     8. Tobacco abuse  Z72.0        Problem List Items  Addressed This Visit       Cardiovascular and Mediastinum   STEMI (ST elevation myocardial infarction) (HCC)   CAD (coronary artery disease)   HTN (hypertension)   Ischemic cardiomyopathy     Other   Tobacco abuse   Hyperlipidemia   Atypical chest pain   Other Visit Diagnoses       CHF (congestive heart failure), NYHA class I, acute, combined (HCC)    -  Primary   improved  with no SOB after GDMT.          Disposition:   Return in about 3 months (around 03/25/2024).    Total time spent: 30 minutes  Signed,  Denyse Bathe, MD  12/24/2023 9:25 AM    [facility]

## 2024-03-06 ENCOUNTER — Other Ambulatory Visit

## 2024-03-06 DIAGNOSIS — I10 Essential (primary) hypertension: Secondary | ICD-10-CM

## 2024-03-06 DIAGNOSIS — E538 Deficiency of other specified B group vitamins: Secondary | ICD-10-CM

## 2024-03-06 DIAGNOSIS — E559 Vitamin D deficiency, unspecified: Secondary | ICD-10-CM

## 2024-03-06 DIAGNOSIS — E782 Mixed hyperlipidemia: Secondary | ICD-10-CM

## 2024-03-06 DIAGNOSIS — E1165 Type 2 diabetes mellitus with hyperglycemia: Secondary | ICD-10-CM

## 2024-03-07 LAB — HEMOGLOBIN A1C
Est. average glucose Bld gHb Est-mCnc: 134 mg/dL
Hgb A1c MFr Bld: 6.3 % — ABNORMAL HIGH (ref 4.8–5.6)

## 2024-03-07 LAB — CMP14+EGFR
ALT: 19 IU/L (ref 0–44)
AST: 17 IU/L (ref 0–40)
Albumin: 4.1 g/dL (ref 3.9–4.9)
Alkaline Phosphatase: 80 IU/L (ref 47–123)
BUN/Creatinine Ratio: 13 (ref 10–24)
BUN: 13 mg/dL (ref 8–27)
Bilirubin Total: 0.7 mg/dL (ref 0.0–1.2)
CO2: 22 mmol/L (ref 20–29)
Calcium: 9.3 mg/dL (ref 8.6–10.2)
Chloride: 103 mmol/L (ref 96–106)
Creatinine, Ser: 1.01 mg/dL (ref 0.76–1.27)
Globulin, Total: 2.6 g/dL (ref 1.5–4.5)
Glucose: 133 mg/dL — ABNORMAL HIGH (ref 70–99)
Potassium: 4.7 mmol/L (ref 3.5–5.2)
Sodium: 140 mmol/L (ref 134–144)
Total Protein: 6.7 g/dL (ref 6.0–8.5)
eGFR: 82 mL/min/1.73 (ref >59)

## 2024-03-07 LAB — LIPID PANEL
Chol/HDL Ratio: 2.5 ratio (ref 0.0–5.0)
Cholesterol, Total: 127 mg/dL (ref 100–199)
HDL: 50 mg/dL (ref >39)
LDL Chol Calc (NIH): 66 mg/dL (ref 0–99)
Triglycerides: 50 mg/dL (ref 0–149)
VLDL Cholesterol Cal: 11 mg/dL (ref 5–40)

## 2024-03-07 LAB — TSH: TSH: 1.12 u[IU]/mL (ref 0.450–4.500)

## 2024-03-07 LAB — VITAMIN D 25 HYDROXY (VIT D DEFICIENCY, FRACTURES): Vit D, 25-Hydroxy: 39.2 ng/mL (ref 30.0–100.0)

## 2024-03-07 LAB — VITAMIN B12: Vitamin B-12: 658 pg/mL (ref 232–1245)

## 2024-03-09 ENCOUNTER — Ambulatory Visit: Admitting: Family

## 2024-03-09 ENCOUNTER — Encounter: Payer: Self-pay | Admitting: Family

## 2024-03-09 ENCOUNTER — Ambulatory Visit: Payer: Self-pay

## 2024-03-09 VITALS — BP 126/84 | HR 72 | Ht 71.0 in | Wt 173.0 lb

## 2024-03-09 DIAGNOSIS — I2510 Atherosclerotic heart disease of native coronary artery without angina pectoris: Secondary | ICD-10-CM

## 2024-03-09 DIAGNOSIS — E782 Mixed hyperlipidemia: Secondary | ICD-10-CM

## 2024-03-09 DIAGNOSIS — I5022 Chronic systolic (congestive) heart failure: Secondary | ICD-10-CM

## 2024-03-09 DIAGNOSIS — J439 Emphysema, unspecified: Secondary | ICD-10-CM | POA: Diagnosis not present

## 2024-03-09 DIAGNOSIS — E1165 Type 2 diabetes mellitus with hyperglycemia: Secondary | ICD-10-CM

## 2024-03-09 DIAGNOSIS — I10 Essential (primary) hypertension: Secondary | ICD-10-CM

## 2024-03-09 LAB — POC CREATINE & ALBUMIN,URINE
Creatinine, POC: 50 mg/dL
Microalbumin Ur, POC: 10 mg/L

## 2024-03-09 NOTE — Assessment & Plan Note (Signed)
 Blood pressure well controlled with current medications.  Continue current therapy.  Will reassess at follow up.

## 2024-03-09 NOTE — Assessment & Plan Note (Signed)
 Patient is seen by cardiology, who manage this condition.  He is well controlled with current therapy.   Will defer to them for further changes to plan of care.

## 2024-03-09 NOTE — Assessment & Plan Note (Signed)
 Patient stable.  Well controlled with current therapy.   Continue current meds.

## 2024-03-09 NOTE — Assessment & Plan Note (Signed)
 Continue current therapy for lipid control. Will modify as needed based on labwork results.

## 2024-03-09 NOTE — Assessment & Plan Note (Signed)
 Continue current diabetes POC, as patient has been well controlled on current regimen.  Will adjust meds if needed based on labs.

## 2024-03-09 NOTE — Progress Notes (Signed)
 Established Patient Office Visit  Subjective:  Patient ID: Walter Frazier, male    DOB: 1955/07/11  Age: 68 y.o. MRN: [id number]  Chief Complaint  Patient presents with   Follow-up    6 month follow up    Patient is here today for his 6 months follow up.  He has been feeling fairly well since last appointment.   He does not have additional concerns to discuss today.  Labs were done prior to appointment, will discuss in detail today.  His labs are much better, A1C is now well controlled, patient has been started on Jardiance  since his last appointment with me.   He needs refills.   I have reviewed his active problem list, medication list, allergies, health maintenance, notes from last encounter, lab results for his appointment today.      No other concerns at this time.   Past Medical History:  Diagnosis Date   Acute respiratory failure with hypoxia (HCC) 10/31/2012   Allergic rhinitis 08/23/2021   Anoxic brain damage (HCC) 11/26/2012   Anoxic encephalopathy (HCC)    a. s/p VT arrest 10/2012.   Coronary artery disease    a. VT Arrest/Ant STEMI- Cath/PCI: DES to LAD (no other details of cath/pci in Epic).   Cough, unspecified 08/23/2021   Dysphagia    a. 11/2012 s/p arrest with prolonged intubation and Trach.   Encephalopathy acute 10/31/2012   Encounter for screening for malignant neoplasm of respiratory organs 12/11/2012   History of MI (myocardial infarction) 12/26/2012   Hx of seizure disorder 12/26/2012   Formatting of this note might be different from the original.  Seizure d/o after hypothermia protocol   Hyperlipidemia    Hypertension    Inspiratory stridor 12/11/2012   Ischemic cardiomyopathy    a. 11/2012 Echo: Ef 25-30%, septal apical & ant HK. No apical thrombus.   Myocardial infarction [facility]) 10/02/2012   Other specified health status 08/23/2021   Seizure (HCC) 12/08/2012   Seizure disorder (HCC)    a. in setting of encephalopathy post VT arrest  11/2012.   Seizures (HCC)    Shortness of breath 12/02/2012   Status epilepticus (HCC) 11/03/2012   Tobacco abuse    Tracheostomy status (HCC) 11/14/2012   Troponin level elevated 12/26/2012   V-tach (HCC) 10/31/2012    Past Surgical History:  Procedure Laterality Date   BACK SURGERY     ~20 years ago by Dr. Leeann   CARDIAC CATHETERIZATION     CORONARY ANGIOPLASTY WITH STENT PLACEMENT     TRACHEOSTOMY  10/2012   TRACHEOSTOMY CLOSURE  12/2012    Social History   Socioeconomic History   Marital status: Married    Spouse name: Not on file   Number of children: Not on file   Years of education: Not on file   Highest education level: Not on file  Occupational History   Not on file  Tobacco Use   Smoking status: Former    Current packs/day: 0.00    Average packs/day: 1 pack/day for 30.0 years (30.0 ttl pk-yrs)    Types: Cigarettes    Start date: 10/31/1982    Quit date: 10/30/2012    Years since quitting: 11.3   Smokeless tobacco: Never  Substance and Sexual Activity   Alcohol use: Not Currently    Alcohol/week: 12.0 standard drinks of alcohol    Types: 12 drink(s) per week    Comment: used to drink ~ 6 pack beer/weekend   Drug use: No  Sexual activity: Not on file  Other Topics Concern   Not on file  Social History Narrative   Lives in [place] with his wife.  Previously a Writer in Geologist, engineering.  Most recently worked in Environmental manager.   Social Drivers of Corporate investment banker Strain: Not on file  Food Insecurity: Not on file  Transportation Needs: Not on file  Physical Activity: Not on file  Stress: Not on file  Social Connections: Not on file  Intimate Partner Violence: Not on file    Family History  Problem Relation Age of Onset   Heart disease Father     Allergies  Allergen Reactions   Latex Rash and Dermatitis    Review of Systems  All other systems reviewed and are negative.      Objective:    BP 126/84   Pulse 72   Ht 5' 11 (1.803 m)   Wt 173 lb (78.5 kg)   SpO2 97%   BMI 24.13 kg/m   Vitals:   03/09/24 0856  BP: 126/84  Pulse: 72  Height: 5' 11 (1.803 m)  Weight: 173 lb (78.5 kg)  SpO2: 97%  BMI (Calculated): 24.14    Physical Exam Vitals and nursing note reviewed.  Constitutional:      Appearance: Normal appearance. He is normal weight.  Eyes:     Pupils: Pupils are equal, round, and reactive to light.  Cardiovascular:     Rate and Rhythm: Normal rate and regular rhythm.     Pulses: Normal pulses.     Heart sounds: Normal heart sounds.  Pulmonary:     Effort: Pulmonary effort is normal.     Breath sounds: Normal breath sounds.  Neurological:     Mental Status: He is alert.  Psychiatric:        Mood and Affect: Mood normal.        Behavior: Behavior normal.        Thought Content: Thought content normal.      No results found for any visits on 03/09/24.  Recent Results (from the past 2160 hours)  Vitamin B12     Status: None   Collection Time: 03/06/24  8:27 AM  Result Value Ref Range   Vitamin B-12 658 232 - 1,245 pg/mL  Vitamin D  (25 hydroxy)     Status: None   Collection Time: 03/06/24  8:27 AM  Result Value Ref Range   Vit D, 25-Hydroxy 39.2 30.0 - 100.0 ng/mL    Comment: Vitamin D  deficiency has been defined by the Institute of Medicine and an Endocrine Society practice guideline as a level of serum 25-OH vitamin D  less than 20 ng/mL (1,2). The Endocrine Society went on to further define vitamin D  insufficiency as a level between 21 and 29 ng/mL (2). 1. IOM (Institute of Medicine). 2010. Dietary reference    intakes for calcium  and D. [place]: The    [employer]. 2. Holick MF, Binkley Mangum, Bischoff-Ferrari HA, et al.    Evaluation, treatment, and prevention of vitamin D     deficiency: an Endocrine Society clinical practice    guideline. JCEM. 2011 Jul; 96(7):1911-30.   Lipid panel     Status: None   Collection  Time: 03/06/24  8:27 AM  Result Value Ref Range   Cholesterol, Total 127 100 - 199 mg/dL   Triglycerides 50 0 - 149 mg/dL   HDL 50 >60 mg/dL   VLDL Cholesterol Cal 11 5 -  40 mg/dL   LDL Chol Calc (NIH) 66 0 - 99 mg/dL   Chol/HDL Ratio 2.5 0.0 - 5.0 ratio    Comment:                                   T. Chol/HDL Ratio                                             Men  Women                               1/2 Avg.Risk  3.4    3.3                                   Avg.Risk  5.0    4.4                                2X Avg.Risk  9.6    7.1                                3X Avg.Risk 23.4   11.0   CMP14+EGFR     Status: Abnormal   Collection Time: 03/06/24  8:27 AM  Result Value Ref Range   Glucose 133 (H) 70 - 99 mg/dL   BUN 13 8 - 27 mg/dL   Creatinine, Ser 8.98 0.76 - 1.27 mg/dL   eGFR 82 >40 fO/fpw/8.26   BUN/Creatinine Ratio 13 10 - 24   Sodium 140 134 - 144 mmol/L   Potassium 4.7 3.5 - 5.2 mmol/L   Chloride 103 96 - 106 mmol/L   CO2 22 20 - 29 mmol/L   Calcium  9.3 8.6 - 10.2 mg/dL   Total Protein 6.7 6.0 - 8.5 g/dL   Albumin 4.1 3.9 - 4.9 g/dL   Globulin, Total 2.6 1.5 - 4.5 g/dL   Bilirubin Total 0.7 0.0 - 1.2 mg/dL   Alkaline Phosphatase 80 47 - 123 IU/L   AST 17 0 - 40 IU/L   ALT 19 0 - 44 IU/L  TSH     Status: None   Collection Time: 03/06/24  8:27 AM  Result Value Ref Range   TSH 1.120 0.450 - 4.500 uIU/mL  Hemoglobin A1c     Status: Abnormal   Collection Time: 03/06/24  8:27 AM  Result Value Ref Range   Hgb A1c MFr Bld 6.3 (H) 4.8 - 5.6 %    Comment:          Prediabetes: 5.7 - 6.4          Diabetes: >6.4          Glycemic control for adults with diabetes: <7.0    Est. average glucose Bld gHb Est-mCnc 134 mg/dL       Assessment & Plan Chronic obstructive pulmonary disease with emphysema, unspecified emphysema type (HCC) Patient stable.  Well controlled with current therapy.   Continue current meds.   Type 2 diabetes mellitus with hyperglycemia, without  long-term current use of insulin  (HCC) Continue current diabetes POC, as patient has  been well controlled on current regimen.  Will adjust meds if needed based on labs.   Mixed hyperlipidemia Continue current therapy for lipid control. Will modify as needed based on labwork results.   Essential (primary) hypertension Blood pressure well controlled with current medications.  Continue current therapy.  Will reassess at follow up.   Coronary artery disease involving native coronary artery of native heart, unspecified whether angina present Chronic systolic (congestive) heart failure [facility]) Patient is seen by cardiology, who manage this condition.  He is well controlled with current therapy.   Will defer to them for further changes to plan of care.     Return in about 6 months (around 09/07/2024) for AWV.   Total time spent: 20 minutes  ALAN CHRISTELLA ARRANT, FNP  03/09/2024   This document may have been prepared by [facility] Voice Recognition software and as such may include unintentional dictation errors.

## 2024-03-26 ENCOUNTER — Encounter: Payer: Self-pay | Admitting: Cardiovascular Disease

## 2024-03-26 ENCOUNTER — Ambulatory Visit (INDEPENDENT_AMBULATORY_CARE_PROVIDER_SITE_OTHER): Admitting: Cardiovascular Disease

## 2024-03-26 VITALS — BP 120/76 | HR 76 | Ht 71.0 in | Wt 175.4 lb

## 2024-03-26 DIAGNOSIS — I10 Essential (primary) hypertension: Secondary | ICD-10-CM | POA: Diagnosis not present

## 2024-03-26 DIAGNOSIS — I255 Ischemic cardiomyopathy: Secondary | ICD-10-CM

## 2024-03-26 DIAGNOSIS — E782 Mixed hyperlipidemia: Secondary | ICD-10-CM

## 2024-03-26 DIAGNOSIS — I2510 Atherosclerotic heart disease of native coronary artery without angina pectoris: Secondary | ICD-10-CM

## 2024-03-26 DIAGNOSIS — I5022 Chronic systolic (congestive) heart failure: Secondary | ICD-10-CM | POA: Diagnosis not present

## 2024-03-26 DIAGNOSIS — I2102 ST elevation (STEMI) myocardial infarction involving left anterior descending coronary artery: Secondary | ICD-10-CM

## 2024-03-26 NOTE — Progress Notes (Signed)
 Cardiology Office Note   Date:  03/26/2024   ID:  Eleuterio, Dollar April 26, 1956, MRN [id number]  PCP:  Orlean Alan HERO, FNP  Cardiologist:  Denyse Bathe, MD      History of Present Illness: Walter Frazier is a 68 y.o. male who presents for  Chief Complaint  Patient presents with  . Follow-up    3 month follow up    Feels better, more energy.      Past Medical History:  Diagnosis Date  . Acute respiratory failure with hypoxia (HCC) 10/31/2012  . Allergic rhinitis 08/23/2021  . Anoxic brain damage (HCC) 11/26/2012  . Anoxic encephalopathy (HCC)    a. s/p VT arrest 10/2012.  SABRA Coronary artery disease    a. VT Arrest/Ant STEMI- Cath/PCI: DES to LAD (no other details of cath/pci in Epic).  . Cough, unspecified 08/23/2021  . Dysphagia    a. 11/2012 s/p arrest with prolonged intubation and Trach.  . Encephalopathy acute 10/31/2012  . Encounter for screening for malignant neoplasm of respiratory organs 12/11/2012  . History of MI (myocardial infarction) 12/26/2012  . Hx of seizure disorder 12/26/2012   Formatting of this note might be different from the original.  Seizure d/o after hypothermia protocol  . Hyperlipidemia   . Hypertension   . Inspiratory stridor 12/11/2012  . Ischemic cardiomyopathy    a. 11/2012 Echo: Ef 25-30%, septal apical & ant HK. No apical thrombus.  . Myocardial infarction (HCC) 10/02/2012  . Other specified health status 08/23/2021  . Seizure (HCC) 12/08/2012  . Seizure disorder (HCC)    a. in setting of encephalopathy post VT arrest 11/2012.  . Seizures (HCC)   . Shortness of breath 12/02/2012  . Status epilepticus (HCC) 11/03/2012  . Tobacco abuse   . Tracheostomy status (HCC) 11/14/2012  . Troponin level elevated 12/26/2012  . V-tach [facility]) 10/31/2012     Past Surgical History:  Procedure Laterality Date  . BACK SURGERY     ~20 years ago by Dr. Leeann  . CARDIAC CATHETERIZATION    . CORONARY ANGIOPLASTY WITH STENT  PLACEMENT    . TRACHEOSTOMY  10/2012  . TRACHEOSTOMY CLOSURE  12/2012     Current Outpatient Medications  Medication Sig Dispense Refill  . aspirin  81 MG chewable tablet Chew 1 tablet (81 mg total) by mouth daily.    . atorvastatin  (LIPITOR ) 80 MG tablet TAKE ONE-HALF TABLET BY MOUTH AT BEDTIME FOR CHOLESTEROL    . carvedilol  (COREG ) 25 MG tablet Take 25 mg by mouth 2 (two) times daily with a meal.    . cetirizine  (ZYRTEC ) 10 MG tablet Take 10 mg by mouth daily.    . Cholecalciferol 25 MCG (1000 UT) tablet Take 1 tablet by mouth daily.    . empagliflozin  (JARDIANCE ) 25 MG TABS tablet Take 1 tablet (25 mg total) by mouth daily before breakfast. 30 tablet 11  . esomeprazole (NEXIUM) 40 MG capsule Take 40 mg by mouth daily at 12 noon.    . fluticasone (FLONASE) 50 MCG/ACT nasal spray Place into both nostrils daily.    . sacubitril-valsartan (ENTRESTO ) 24-26 MG Take 1 tablet by mouth 2 (two) times daily. 60 tablet 2   No current facility-administered medications for this visit.    Allergies:   Latex    Social History:   reports that he quit smoking about 11 years ago. His smoking use included cigarettes. He started smoking about 41 years ago. He has a 30 pack-year smoking history. He  has never used smokeless tobacco. He reports that he does not currently use alcohol after a past usage of about 12.0 standard drinks of alcohol per week. He reports that he does not use drugs.   Family History:  family history includes Heart disease in his father.    ROS:     Review of Systems  Constitutional: Negative.   HENT: Negative.    Eyes: Negative.   Respiratory: Negative.    Gastrointestinal: Negative.   Genitourinary: Negative.   Musculoskeletal: Negative.   Skin: Negative.   Neurological: Negative.   Endo/Heme/Allergies: Negative.   Psychiatric/Behavioral: Negative.    All other systems reviewed and are negative.     All other systems are reviewed and negative.    PHYSICAL EXAM: VS:   BP 120/76   Pulse 76   Ht 5' 11 (1.803 m)   Wt 175 lb 6.4 oz (79.6 kg)   SpO2 98%   BMI 24.46 kg/m  , BMI Body mass index is 24.46 kg/m. Last weight:  Wt Readings from Last 3 Encounters:  03/26/24 175 lb 6.4 oz (79.6 kg)  03/09/24 173 lb (78.5 kg)  12/24/23 173 lb 9.6 oz (78.7 kg)     Physical Exam Vitals reviewed.  Constitutional:      Appearance: Normal appearance. He is normal weight.  HENT:     Head: Normocephalic.     Nose: Nose normal.     Mouth/Throat:     Mouth: Mucous membranes are moist.  Eyes:     Pupils: Pupils are equal, round, and reactive to light.  Cardiovascular:     Rate and Rhythm: Normal rate and regular rhythm.     Pulses: Normal pulses.     Heart sounds: Normal heart sounds.  Pulmonary:     Effort: Pulmonary effort is normal.  Abdominal:     General: Abdomen is flat. Bowel sounds are normal.  Musculoskeletal:        General: Normal range of motion.     Cervical back: Normal range of motion.  Skin:    General: Skin is warm.  Neurological:     General: No focal deficit present.     Mental Status: He is alert.  Psychiatric:        Mood and Affect: Mood normal.       EKG:   Recent Labs: 03/06/2024: ALT 19; BUN 13; Creatinine, Ser 1.01; Potassium 4.7; Sodium 140; TSH 1.120    Lipid Panel    Component Value Date/Time   CHOL 127 03/06/2024 0827   TRIG 50 03/06/2024 0827   HDL 50 03/06/2024 0827   CHOLHDL 2.5 03/06/2024 0827   LDLCALC 66 03/06/2024 0827      Other studies Reviewed: Additional studies/ records that were reviewed today include:  Review of the above records demonstrates:       No data to display            ASSESSMENT AND PLAN:    ICD-10-CM   1. Chronic systolic (congestive) heart failure (HCC)  I50.22 PCV ECHOCARDIOGRAM COMPLETE   On GDMT, feels much better.    2. Coronary artery disease involving native coronary artery of native heart, unspecified whether angina present  I25.10 PCV ECHOCARDIOGRAM  COMPLETE    3. Essential (primary) hypertension  I10 PCV ECHOCARDIOGRAM COMPLETE    4. Ischemic cardiomyopathy  I25.5 PCV ECHOCARDIOGRAM COMPLETE    5. ST elevation myocardial infarction involving left anterior descending (LAD) coronary artery (HCC)  I21.02 PCV ECHOCARDIOGRAM COMPLETE  6. Mixed hyperlipidemia  E78.2 PCV ECHOCARDIOGRAM COMPLETE       Problem List Items Addressed This Visit       Cardiovascular and Mediastinum   STEMI (ST elevation myocardial infarction) (HCC)   Relevant Orders   PCV ECHOCARDIOGRAM COMPLETE   Chronic systolic (congestive) heart failure (HCC) - Primary   Relevant Orders   PCV ECHOCARDIOGRAM COMPLETE   CAD (coronary artery disease)   Relevant Orders   PCV ECHOCARDIOGRAM COMPLETE   Essential (primary) hypertension   Relevant Orders   PCV ECHOCARDIOGRAM COMPLETE   Ischemic cardiomyopathy   Relevant Orders   PCV ECHOCARDIOGRAM COMPLETE     Other   Hyperlipidemia   Relevant Orders   PCV ECHOCARDIOGRAM COMPLETE       Disposition:   Return in about 3 months (around 06/26/2024) for echo and f/u.    Total time spent: 40 minutes  Signed,  Denyse Bathe, MD  03/26/2024 10:27 AM    [facility]

## 2024-05-20 ENCOUNTER — Other Ambulatory Visit

## 2024-05-20 DIAGNOSIS — I340 Nonrheumatic mitral (valve) insufficiency: Secondary | ICD-10-CM

## 2024-05-20 DIAGNOSIS — I10 Essential (primary) hypertension: Secondary | ICD-10-CM

## 2024-05-20 DIAGNOSIS — I2102 ST elevation (STEMI) myocardial infarction involving left anterior descending coronary artery: Secondary | ICD-10-CM

## 2024-05-20 DIAGNOSIS — I2510 Atherosclerotic heart disease of native coronary artery without angina pectoris: Secondary | ICD-10-CM

## 2024-05-20 DIAGNOSIS — I5022 Chronic systolic (congestive) heart failure: Secondary | ICD-10-CM

## 2024-05-20 DIAGNOSIS — E782 Mixed hyperlipidemia: Secondary | ICD-10-CM

## 2024-05-20 DIAGNOSIS — I255 Ischemic cardiomyopathy: Secondary | ICD-10-CM

## 2024-05-21 ENCOUNTER — Other Ambulatory Visit

## 2024-05-25 ENCOUNTER — Ambulatory Visit: Admitting: Cardiovascular Disease

## 2024-05-25 ENCOUNTER — Encounter: Payer: Self-pay | Admitting: Cardiovascular Disease

## 2024-05-25 ENCOUNTER — Ambulatory Visit: Admitting: Family

## 2024-05-25 VITALS — BP 121/73 | HR 80 | Ht 71.0 in | Wt 177.2 lb

## 2024-05-25 DIAGNOSIS — I255 Ischemic cardiomyopathy: Secondary | ICD-10-CM | POA: Diagnosis not present

## 2024-05-25 DIAGNOSIS — I2510 Atherosclerotic heart disease of native coronary artery without angina pectoris: Secondary | ICD-10-CM | POA: Diagnosis not present

## 2024-05-25 DIAGNOSIS — I10 Essential (primary) hypertension: Secondary | ICD-10-CM

## 2024-05-25 DIAGNOSIS — I2102 ST elevation (STEMI) myocardial infarction involving left anterior descending coronary artery: Secondary | ICD-10-CM

## 2024-05-25 DIAGNOSIS — I5022 Chronic systolic (congestive) heart failure: Secondary | ICD-10-CM | POA: Diagnosis not present

## 2024-05-25 DIAGNOSIS — E1165 Type 2 diabetes mellitus with hyperglycemia: Secondary | ICD-10-CM | POA: Diagnosis not present

## 2024-05-25 DIAGNOSIS — E782 Mixed hyperlipidemia: Secondary | ICD-10-CM | POA: Diagnosis not present

## 2024-05-25 NOTE — Progress Notes (Signed)
 "     Cardiology Office Note   Date:  05/25/2024   ID:  Walter, Suydam Jul Frazier, Walter Frazier, Walter Frazier  PCP:  Orlean Alan HERO, FNP  Cardiologist:  Denyse Bathe, MD      History of Present Illness: Walter Frazier is a 68 y.o. male who presents for  Chief Complaint  Patient presents with   Follow-up    2 month follow up and echo results    No complaints.      Past Medical History:  Diagnosis Date   Acute respiratory failure with hypoxia (HCC) 10/31/2012   Allergic rhinitis 08/23/2021   Anoxic brain damage (HCC) 11/26/2012   Anoxic encephalopathy (HCC)    a. s/p VT arrest 10/2012.   Coronary artery disease    a. VT Arrest/Ant STEMI- Cath/PCI: DES to LAD (no other details of cath/pci in Epic).   Cough, unspecified 08/23/2021   Dysphagia    a. 11/2012 s/p arrest with prolonged intubation and Trach.   Encephalopathy acute 10/31/2012   Encounter for screening for malignant neoplasm of respiratory organs 12/11/2012   History of MI (myocardial infarction) 12/26/2012   Hx of seizure disorder 12/26/2012   Formatting of this note might be different from the original.  Seizure d/o after hypothermia protocol   Hyperlipidemia    Hypertension    Inspiratory stridor 12/11/2012   Ischemic cardiomyopathy    a. 11/2012 Echo: Ef 25-30%, septal apical & ant HK. No apical thrombus.   Myocardial infarction [facility]) 10/02/2012   Other specified health status 08/23/2021   Seizure (HCC) 12/08/2012   Seizure disorder (HCC)    a. in setting of encephalopathy post VT arrest 11/2012.   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 Leeann   CARDIAC CATHETERIZATION     CORONARY ANGIOPLASTY WITH STENT PLACEMENT     TRACHEOSTOMY   10/2012   TRACHEOSTOMY CLOSURE  12/2012     Current Outpatient Medications  Medication Sig Dispense Refill   aspirin  81 MG chewable tablet Chew 1 tablet (81 mg total) by mouth daily.     atorvastatin  (LIPITOR ) 80 MG tablet TAKE ONE-HALF TABLET BY MOUTH AT BEDTIME FOR CHOLESTEROL     carvedilol  (COREG ) 25 MG tablet Take 25 mg by mouth 2 (two) times daily with a meal.     cetirizine  (ZYRTEC ) 10 MG tablet Take 10 mg by mouth daily.     Cholecalciferol 25 MCG (1000 UT) tablet Take 1 tablet by mouth daily.     empagliflozin  (JARDIANCE ) 25 MG TABS tablet Take 1 tablet (25 mg total) by mouth daily before breakfast. 30 tablet 11   esomeprazole (NEXIUM) 40 MG capsule Take 40 mg by mouth daily at 12 noon.     fluticasone (FLONASE) 50 MCG/ACT nasal spray Place into both nostrils daily.     sacubitril-valsartan (ENTRESTO ) 24-26 MG Take 1 tablet by mouth 2 (two) times daily. 60 tablet 2   No current facility-administered medications for this visit.    Allergies:   Latex    Social History:   reports that he quit smoking about 11 years ago. His smoking use included cigarettes. He started smoking about 41 years ago. He has a 30  pack-year smoking history. He has never used smokeless tobacco. He reports that he does not currently use alcohol after a past usage of about 12.0 standard drinks of alcohol per week. He reports that he does not use drugs.   Family History:  family history includes Heart disease in his father.    ROS:     Review of Systems  Constitutional: Negative.   HENT: Negative.    Eyes: Negative.   Respiratory: Negative.    Gastrointestinal: Negative.   Genitourinary: Negative.   Musculoskeletal: Negative.   Skin: Negative.   Neurological: Negative.   Endo/Heme/Allergies: Negative.   Psychiatric/Behavioral: Negative.    All other systems reviewed and are negative.     All other systems are reviewed and negative.    PHYSICAL EXAM: VS:  BP 121/73   Pulse 80   Ht 5' 11  (1.803 m)   Wt 177 lb 3.2 oz (80.4 kg)   SpO2 96%   BMI 24.71 kg/m  , BMI Body mass index is 24.71 kg/m. Last weight:  Wt Readings from Last 3 Encounters:  05/25/24 177 lb 3.2 oz (80.4 kg)  03/26/24 175 lb 6.4 oz (79.6 kg)  03/09/24 173 lb (78.5 kg)     Physical Exam Vitals reviewed.  Constitutional:      Appearance: Normal appearance. He is normal weight.  HENT:     Head: Normocephalic.     Nose: Nose normal.     Mouth/Throat:     Mouth: Mucous membranes are moist.  Eyes:     Pupils: Pupils are equal, round, and reactive to light.  Cardiovascular:     Rate and Rhythm: Normal rate and regular rhythm.     Pulses: Normal pulses.     Heart sounds: Normal heart sounds.  Pulmonary:     Effort: Pulmonary effort is normal.  Abdominal:     General: Abdomen is flat. Bowel sounds are normal.  Musculoskeletal:        General: Normal range of motion.     Cervical back: Normal range of motion.  Skin:    General: Skin is warm.  Neurological:     General: No focal deficit present.     Mental Status: He is alert.  Psychiatric:        Mood and Affect: Mood normal.       EKG:   Recent Labs: 03/06/2024: ALT 19; BUN 13; Creatinine, Ser 1.01; Potassium 4.7; Sodium 140; TSH 1.120    Lipid Panel    Component Value Date/Time   CHOL 127 03/06/2024 0827   TRIG 50 03/06/2024 0827   HDL 50 03/06/2024 0827   CHOLHDL 2.5 03/06/2024 0827   LDLCALC 66 03/06/2024 0827      Other studies Reviewed: Additional studies/ records that were reviewed today include:  Review of the above records demonstrates:       No data to display            ASSESSMENT AND PLAN:    ICD-10-CM   1. Ischemic cardiomyopathy  I25.5    On GDMT    2. Coronary artery disease involving native coronary artery of native heart, unspecified whether angina present  I25.10     3. Chronic systolic (congestive) heart failure (HCC)  I50.22     4. Essential (primary) hypertension  I10     5. ST elevation  myocardial infarction involving left anterior descending (LAD) coronary artery (HCC)  I21.02     6. Mixed hyperlipidemia  E78.2  7. Primary hypertension  I10     8. Type 2 diabetes mellitus with hyperglycemia, without long-term current use of insulin  (HCC)  E11.65        Problem List Items Addressed This Visit       Cardiovascular and Mediastinum   STEMI (ST elevation myocardial infarction) (HCC)   Chronic systolic (congestive) heart failure (HCC)   CAD (coronary artery disease)   Essential (primary) hypertension   Ischemic cardiomyopathy - Primary     Endocrine   Type 2 diabetes mellitus with hyperglycemia, without long-term current use of insulin  (HCC)     Other   Hyperlipidemia   Other Visit Diagnoses       Primary hypertension              Disposition:   Return in about 3 months (around 08/23/2024).    Total time spent: 40 minutes  Signed,  Denyse Bathe, MD  05/25/2024 11:44 AM    [facility] "

## 2024-06-16 ENCOUNTER — Ambulatory Visit: Admitting: Family

## 2024-08-21 ENCOUNTER — Ambulatory Visit: Admitting: Cardiovascular Disease

## 2024-09-07 ENCOUNTER — Ambulatory Visit: Admitting: Family
